# Patient Record
Sex: Female | Born: 1945 | Race: White | Hispanic: No | Marital: Married
Health system: Midwestern US, Community
[De-identification: ages and names within clinical notes are randomized; demographics above are authoritative.]

## PROBLEM LIST (undated history)

## (undated) DIAGNOSIS — G4733 Obstructive sleep apnea (adult) (pediatric): Secondary | ICD-10-CM

## (undated) DIAGNOSIS — K219 Gastro-esophageal reflux disease without esophagitis: Secondary | ICD-10-CM

## (undated) DIAGNOSIS — J45909 Unspecified asthma, uncomplicated: Secondary | ICD-10-CM

## (undated) DIAGNOSIS — L138 Other specified bullous disorders: Secondary | ICD-10-CM

## (undated) DIAGNOSIS — I10 Essential (primary) hypertension: Secondary | ICD-10-CM

## (undated) DIAGNOSIS — G709 Myoneural disorder, unspecified: Secondary | ICD-10-CM

## (undated) DIAGNOSIS — H9190 Unspecified hearing loss, unspecified ear: Secondary | ICD-10-CM

## (undated) DIAGNOSIS — G20 Parkinson's disease: Secondary | ICD-10-CM

## (undated) DIAGNOSIS — G4730 Sleep apnea, unspecified: Secondary | ICD-10-CM

## (undated) DIAGNOSIS — I341 Nonrheumatic mitral (valve) prolapse: Secondary | ICD-10-CM

## (undated) DIAGNOSIS — R42 Dizziness and giddiness: Secondary | ICD-10-CM

## (undated) DIAGNOSIS — E785 Hyperlipidemia, unspecified: Secondary | ICD-10-CM

## (undated) DIAGNOSIS — K625 Hemorrhage of anus and rectum: Secondary | ICD-10-CM

## (undated) DIAGNOSIS — T7840XA Allergy, unspecified, initial encounter: Secondary | ICD-10-CM

## (undated) DIAGNOSIS — G20A1 Parkinson's disease without dyskinesia, without mention of fluctuations: Secondary | ICD-10-CM

## (undated) DIAGNOSIS — R0600 Dyspnea, unspecified: Secondary | ICD-10-CM

## (undated) DIAGNOSIS — M1990 Unspecified osteoarthritis, unspecified site: Secondary | ICD-10-CM

## (undated) DIAGNOSIS — D649 Anemia, unspecified: Secondary | ICD-10-CM

## (undated) HISTORY — DX: Hyperlipidemia, unspecified: E78.5

## (undated) HISTORY — PX: TONSILLECTOMY: SUR1361

## (undated) HISTORY — DX: Anemia, unspecified: D64.9

## (undated) HISTORY — PX: REPLACEMENT TOTAL KNEE BILATERAL: SUR1225

## (undated) HISTORY — DX: Parkinson's disease: G20

## (undated) HISTORY — DX: Allergy, unspecified, initial encounter: T78.40XA

## (undated) HISTORY — PX: KNEE SURGERY: SHX244

## (undated) HISTORY — PX: ABDOMINAL HYSTERECTOMY: SHX81

## (undated) HISTORY — PX: APPENDECTOMY: SHX54

## (undated) HISTORY — DX: Parkinson's disease without dyskinesia, without mention of fluctuations: G20.A1

## (undated) HISTORY — DX: Gastro-esophageal reflux disease without esophagitis: K21.9

---

## 2010-09-22 ENCOUNTER — Inpatient Hospital Stay
Admit: 2010-09-22 | Discharge: 2010-09-22 | Disposition: A | Discharge status: Home / Self Care | Attending: Emergency Medicine

## 2010-09-22 LAB — URINALYSIS WITH MICROSCOPIC
Bilirubin Urine: NEGATIVE
Glucose, Ur: NEGATIVE mg/dL
Nitrite, Urine: NEGATIVE
Specific Gravity, UA: 1.01 (ref <=1.035)
Urobilinogen, Urine: 0.2 E.U./dL (ref 0.2–1.0)
pH, UA: 8 (ref 5.0–8.5)

## 2010-09-22 MED ORDER — CIPROFLOXACIN HCL 500 MG PO TABS
500 MG | ORAL_TABLET | Freq: Two times a day (BID) | ORAL | Status: AC
Start: 2010-09-22 — End: 2010-10-02

## 2010-09-22 MED ORDER — PHENAZOPYRIDINE HCL 100 MG PO TABS
100 MG | ORAL_TABLET | Freq: Three times a day (TID) | ORAL | Status: AC | PRN
Start: 2010-09-22 — End: 2010-09-25

## 2010-09-22 MED ORDER — FLUCONAZOLE 150 MG PO TABS
150 MG | ORAL_TABLET | Freq: Once | ORAL | Status: AC
Start: 2010-09-22 — End: 2010-09-22

## 2010-09-22 MED ADMIN — ciprofloxacin (CIPRO) 500 MG tablet: ORAL | @ 09:00:00 | NDC 00172531260

## 2010-09-22 MED ADMIN — phenazopyridine (PYRIDIUM) tablet 200 mg: ORAL | @ 09:00:00 | NDC 68084029211

## 2010-09-22 MED FILL — PHENAZOPYRIDINE HCL 100 MG PO TABS: 100 mg | ORAL | Qty: 2

## 2010-09-22 MED FILL — CIPROFLOXACIN HCL 500 MG PO TABS: 500 mg | ORAL | Qty: 1

## 2010-09-22 NOTE — ED Notes (Signed)
Discharged with voiced understanding of instructions - scripts given - left in no distress with family    Rudell Cobb, RN  09/22/10 [phone]

## 2010-09-22 NOTE — Discharge Instructions (Signed)
Urinary Tract Infection (UTI)  Infections of the urinary tract can start in several places. A bladder infection (cystitis), a kidney infection (pyelonephritis), and a prostate infection (prostatitis) are different types of urinary tract infections. They usually get better if treated with medicines (antibiotics) that kill germs. Take all the medicine until it is gone. You or your child may feel better in a few days, but TAKE ALL MEDICINE or the infection may not respond and may become more difficult to treat.  HOME CARE INSTRUCTIONS   Drink enough water and fluids to keep the urine clear or pale yellow. Cranberry juice is especially recommended, in addition to large amounts of water.    Avoid caffeine, tea, and carbonated beverages. They tend to irritate the bladder.    Alcohol may irritate the prostate.    Only take over-the-counter or prescription medicines for pain, discomfort, or fever as directed by your caregiver.   FINDING OUT THE RESULTS OF YOUR TEST  Not all test results are available during your visit. If your or your child's test results are not back during the visit, make an appointment with your caregiver to find out the results. Do not assume everything is normal if you have not heard from your caregiver or the medical facility. It is important for you to follow up on all test results.  TO PREVENT FURTHER INFECTIONS:   Empty the bladder often. Avoid holding urine for long periods of time.    After a bowel movement, women should cleanse from front to back. Use each tissue only once.    Empty the bladder before and after sexual intercourse.   SEEK MEDICAL CARE IF:   There is back pain.    You or your child has an oral temperature above 102 F (38.9 C).    Your baby is older than 3 months with a rectal temperature of 100.5 F (38.1 C) or higher for more than 1 day.    Your or your child's problems (symptoms) are no better in 3 days. Return sooner if you or your child is getting worse.   SEEK  IMMEDIATE MEDICAL CARE IF:   There is severe back pain or lower abdominal pain.    You or your child develops chills.    You or your child has an oral temperature above 102 F (38.9 C), not controlled by medicine.    Your baby is older than 3 months with a rectal temperature of 102 F (38.9 C) or higher.    Your baby is 64 months old or younger with a rectal temperature of 100.4 F (38 C) or higher.    There is nausea or vomiting.    There is continued burning or discomfort with urination.   MAKE SURE YOU:   Understand these instructions.    Will watch this condition.    Will get help right away if you or your child is not doing well or gets worse.   Document Released: 06/16/2009   [facility] Patient Information 2012 [place].

## 2010-09-22 NOTE — ED Provider Notes (Signed)
HPI Pt. With 3 day hx or hematuria, dysuria, frequency, urgency. No fever, no chills, no other sxs.    Review of Systems   Constitutional: Negative for fever and chills.   HENT: Negative for ear pain, sore throat and sinus pressure.    Eyes: Negative for pain, discharge and redness.   Respiratory: Negative for cough, shortness of breath and wheezing.    Cardiovascular: Negative for chest pain.   Gastrointestinal: Negative for nausea, vomiting, diarrhea and abdominal distention.   Genitourinary: Positive for dysuria, urgency, frequency and hematuria.   Musculoskeletal: Negative for back pain and arthralgias.   Skin: Negative for rash and wound.   Neurological: Negative for weakness and headaches.   Hematological: Negative for adenopathy.   All other systems reviewed and are negative.        Physical Exam   Nursing note and vitals reviewed.  Constitutional: She is oriented to person, place, and time. She appears well-developed and well-nourished. No distress.   HENT:   Head: Normocephalic and atraumatic.   Mouth/Throat: Oropharynx is clear and moist.   Eyes: Conjunctivae and EOM are normal. Pupils are equal, round, and reactive to light.   Neck: Normal range of motion. Neck supple. No JVD present. No tracheal deviation present. No thyromegaly present.   Cardiovascular: Normal rate, regular rhythm, normal heart sounds and intact distal pulses.    Pulmonary/Chest: Effort normal and breath sounds normal. No respiratory distress. She has no wheezes. She has no rales.   Abdominal: Bowel sounds are normal. She exhibits no distension. There is no tenderness. There is no guarding.   Musculoskeletal: Normal range of motion.   Lymphadenopathy:     She has no cervical adenopathy.   Neurological: She is alert and oriented to person, place, and time. She has normal reflexes. No cranial nerve deficit. Coordination normal.   Skin: Skin is warm and dry. She is not diaphoretic.     --------------------------------------------- PAST  HISTORY ---------------------------------------------  Past Medical History:  has a past medical history of Asthma and Necrosis of dental pulp.    Past Surgical History:  has past surgical history that includes Knee arthroscopy; Appendectomy; Tonsillectomy; and Hysterectomy.    Social History:  reports that she has never smoked. She does not have any smokeless tobacco history on file. She reports that she does not drink alcohol or use illicit drugs.    Family History: family history is not on file.     The patient???s home medications have been reviewed.    Allergies: Codeine    -------------------------------------------------- RESULTS -------------------------------------------------  Results for orders placed during the hospital encounter of 09/22/10   URINALYSIS WITH MICROSCOPIC       Component Value Range    Color, UA YELLOW  YELLOW     Clarity, UA CLEAR  CLEAR     Glucose, Ur NEGATIVE  NEGATIVE (mg/dL)    Bilirubin Urine NEGATIVE  NEGATIVE     Ketones, Urine TRACE (*) NEGATIVE (mg/dL)    Specific Gravity, UA 1.010  <=1.035     Blood, Urine LARGE (*) NEGATIVE     pH, UA 8.0  5.0 - 8.5     Protein, UA TRACE (*) NEGATIVE (mg/dL)    Urobilinogen, Urine 0.2  0.2 - 1.0 (E.U./dL)    Nitrite, Urine NEGATIVE  NEGATIVE     Leukocyte Esterase, Urine MODERATE (*) NEGATIVE           ------------------------- NURSING NOTES AND VITALS REVIEWED ---------------------------   The nursing notes within  the ED encounter and vital signs as below have been reviewed.   BP 110/71   Pulse 95   Temp(Src) 98.7 ??F (37.1 ??C) (Oral)   Resp 16   Ht 5' (1.524 m)   Wt 208 lb (94.348 kg)   BMI 40.62 kg/m2   SpO2 98%  Oxygen Saturation Interpretation: Normal      ------------------------------------------ PROGRESS NOTES ------------------------------------------   I have spoken with the patient and discussed today???s results, in addition to providing specific details for the plan of care and counseling regarding the diagnosis and prognosis.  Their  questions are answered at this time and they are agreeable with the plan.      --------------------------------- ADDITIONAL PROVIDER NOTES ---------------------------------         Medications   omeprazole (PRILOSEC) 20 MG capsule (not administered)   pravastatin (PRAVACHOL) 80 MG tablet (not administered)   benazepril (LOTENSIN) 10 MG tablet (not administered)   traMADol (ULTRAM) 50 MG tablet (not administered)   clobetasol (TEMOVATE) 0.05 % cream (not administered)   therapeutic multivitamin-minerals (THERAGRAN-M) tablet (not administered)   aspirin 81 MG chewable tablet (not administered)   docusate sodium (COLACE) 100 MG capsule (not administered)   Multiple Vitamins-Calcium (VIACTIV MULTI-VITAMIN) CHEW (not administered)   Cholecalciferol (VITAMIN D3) 1000 UNITS CAPS (not administered)   ciprofloxacin (CIPRO) tablet 500 mg (not administered)   phenazopyridine (PYRIDIUM) 100 MG tablet (not administered)   ciprofloxacin (CIPRO) 500 MG tablet (not administered)   fluconazole (DIFLUCAN) 150 MG tablet (not administered)   ciprofloxacin (CIPRO) 500 MG tablet (not administered)   phenazopyridine (PYRIDIUM) tablet 200 mg (200 mg Oral Given 09/22/10 0525)     Labs Reviewed   URINALYSIS WITH MICROSCOPIC - Abnormal; Notable for the following:     Ketones, Urine TRACE (*)     Blood, Urine LARGE (*)     Protein, UA TRACE (*)     Leukocyte Esterase, Urine MODERATE (*)     All other components within normal limits   URINE CULTURE     1. Acute cystitis           This patient is stable for discharge.  I have shared the specific conditions for return, as well as the importance of follow-up.        Procedures    MDM      .        Noe Gens, MD  09/22/10 [phone]

## 2011-10-26 LAB — URINALYSIS
Bilirubin Urine: NEGATIVE
Glucose, Ur: NEGATIVE mg/dL
Ketones, Urine: NEGATIVE mg/dL
Nitrite, Urine: NEGATIVE
Protein, UA: NEGATIVE mg/dL
Specific Gravity, UA: 1.015 (ref <=1.035)
Urobilinogen, Urine: 0.2 E.U./dL (ref 0.2–1.0)
pH, UA: 7.5 (ref 5.0–8.5)

## 2011-10-26 LAB — COMPREHENSIVE METABOLIC PANEL
ALT: 18 U/L (ref 10–49)
AST: 17 U/L (ref 0–33)
Albumin: 4.2 g/dL (ref 3.2–4.8)
Alkaline Phosphatase: 69 U/L (ref 45–129)
BUN: 8 mg/dL — ABNORMAL LOW (ref 9–23)
CO2: 28 mmol/L (ref 20–31)
Calcium: 9.7 mg/dL (ref 8.6–10.5)
Chloride: 106 mmol/L (ref 99–109)
Creatinine: 0.4 mg/dL — ABNORMAL LOW (ref 0.5–1.1)
Glucose: 100 mg/dL (ref 70–110)
Potassium: 3.8 mmol/L (ref 3.5–5.5)
Sodium: 140 mmol/L (ref 132–146)
Total Bilirubin: 0.6 mg/dL (ref 0.3–1.2)
Total Protein: 6.7 g/dL (ref 5.7–8.2)

## 2011-10-26 LAB — CBC
Hematocrit: 41.8 % (ref 34.0–48.0)
Hemoglobin: 13.9 g/dL (ref 11.5–15.5)
MCH: 31.9 pg (ref 26.0–35.0)
MCHC: 33.4 % (ref 32.0–34.5)
MCV: 95.5 fL (ref 80.0–99.9)
MPV: 8.2 fL (ref 7.0–12.0)
Platelets: 228 E9/L (ref 130–450)
RBC: 4.38 E12/L (ref 3.50–5.50)
RDW: 13.6 fL (ref 11.5–15.0)
WBC: 7.6 E9/L (ref 4.5–11.5)

## 2011-10-26 LAB — MICROSCOPIC URINALYSIS

## 2011-10-26 LAB — ANTIBODY SCREEN: Antibody Screen: NEGATIVE

## 2011-10-26 LAB — GFR CALCULATED: Gfr Calculated: >60 mL/min/{1.73_m2} (ref >=60)

## 2011-10-26 LAB — ABO/RH: ABO/Rh: A POS

## 2011-10-26 NOTE — Progress Notes (Addendum)
[facility] PRE-ADMISSION TESTING INSTRUCTIONS    The Preadmission Testing patient is instructed accordingly using the following criteria (check applicable):    GENERAL INSTRUCTIONS:  [x]  Antibacterial Soap shower or bath AM of Surgery   [x]  Nothing by mouth after midnight   [] Hibiclens shower or bath the night before and the morning of surgery. Do not use             Hibiclens on your face or head.   [] No alcohol or illegal drug use within 24 hours of surgery.  [x] Jewelry, valuables or body piercing's should not be brought to the hospital.     [x]  Please do not wear fingernail polish or make up on the day of surgery.  [x]  Please be sure to arrange transportation to and from the hospital.  [x]  Transfusion Bracelet: Please apply to your wrist /  bring with you to the hospital on the      day of surgery.   []  Bring urine specimen day of surgery  [x]  Bring inhalers day of surgery  []  Bring insulin day of surgery  [x]  Bring in a copy of Living will or Durable Power of attorney papers.  []  C-PAP/ Bi-Pap: If you have sleep apnea and are admitted to the hospital, please bring      your C-Pap machine with you to the hospital.  []  Other Instructions:        PARKING INSTRUCTIONS:  [x]  Parking the day of Surgery is located in the I Lot. The I Lot is located on the corner of [address] and [address]. Enter the hospital through the [facility] entrance. Check in at the [facility] desk area.    [x]  Arrival Time0800:        EDUCATION INSTRUCTIONS:  []  Knee or hip replacement booklet & exercise pamphlets given.    []  [facility] Pamphlet given    [x]  Pre-admission Testing educational folder given  []  Incentive Spirometry,coughing & deep breathing exercises reviewed.     []  Food and Drug interaction pamphlets given.   []  Anesthesia Pamphlets  [x] Procedure/physician specific instructions   [] Lap Cholecystectomy  [] Medication information sheet(s)  [] Breast biopsy  [] Hernia  repair   [] Fluoroscopy Educational pamphlet and Teaching    []   [] Other-  []    [] Other      MEDICATION INSTRUCTIONS:  [x]  Bring a complete medication list, make a note of last time medication taken.  [x]  Take the following medications the morning of Surgery with a sip of water:inhailers and omeprazole          []     What to expect:  [x]  If you are admitted to the hospital post operatively, the following toiletries are are supplied: Soap, toothbrush, tooth[paste, Deoderant and lotion. Slippers are also provided.     []  If you are admitted to the rehabilitation unit for physical therapy postoperatively, pack a suitcase with pajama's and clothing to wear while you are there. Have your family bring it to the rehab unit facility once you are transferred postoperatively.    The above instructions were reviewed with patient/significant other.    Comments:            Nurse Sheran Fava ,R.N.   Date/time 10/26/2011 10:03 AM         PAT:  [phone]

## 2011-11-01 NOTE — H&P (Signed)
Christina Robbins, Christina Robbins                                 [id number]      DATE:  11/02/2011      CHIEF COMPLAINT:  Left knee pain, osteoarthritis.      HISTORY OF PRESENT ILLNESS:  This is a 66 year old white female with chronic   left knee with known osteoarthritis.  She has had previous conservative   measures including injections, medications, physical therapy, and arthroscopy   in the past, none of which have provided lasting relief.  She is having   progressively disabling pain due to arthritis and is being admitted for left   total knee arthroplasty.      FAMILY PHYSICIAN:  Renata Caprice, D.O.      PAST MEDICAL HISTORY:  Medical problems include mitral valve prolapse,   hypertension, irregular heart rhythm, hypercholesterolemia, obesity, GERD,   asthma, sleep apnea, lower gum necrosis, and migraine headaches.      ALLERGIES:  Adverse medical reaction is to ENTEX, swollen thumb; CODEINE,   extreme hives; and VICODIN and VOLTAREN, had a rash on both arms.      MEDICATIONS:  Medications are benazepril HCL 10 mg daily, pravastatin 80 mg   daily, omeprazole 20 mg daily, aspirin 81 mg daily, multivitamin, calcium   supplement, vitamin D3, stool softener, Tessalon inhaler, Veramyst inhaler,   Proventil inhalers, Dulera and Xyzal per allergist, tramadol 50 mg as needed   for pain, Imitrex 50 mg p.r.n. migraine, clobetasol propionate 0.05% for   flareups, lorazepam 2 mg nightly, and for dental procedures amoxicillin 500   mg before dental work.      SOCIAL HISTORY:  She denies tobacco use.  Admits alcohol occasionally.                                  PHYSICAL EXAMINATION      GENERAL APPEARANCE:  Alert, adult, oriented white female.  Height 5 feet 1   inch, weight 215 pounds.      HEENT:  Normocephalic.  Extraocular motions intact.  Sclerae anicteric.   Nares and pharynx are clear.      NECK:  Supple.      LUNGS:  Clear.      CARDIOVASCULAR:  Rate and rhythm are regular.      ABDOMEN:  Soft, nontender.      EXTREMITIES:   Valgus on weightbearing.  Left knee range 0 to 100 degrees.   Distal neurologic and circulatory grossly intact.      LABORATORY AND X-RAY DATA:  X-ray shows stage 4 lateral anterior degenerative   changes, left knee.      CLINICAL IMPRESSION:   Osteoarthritis left knee.      PLAN:  Left total knee arthroplasty.  Procedure, recovery time, risks, and   limitations discussed in detail with patient who requests that we proceed.         Dictated by:  Concha Norway, M.D.         [name] / nmt/de   DD: 11/01/2011    1:59 P    DT: 11/01/2011  3:34 P   [id number]    [phone]   CC:  Christina Robbins, M.D.

## 2011-11-02 ENCOUNTER — Inpatient Hospital Stay
Admit: 2011-11-02 | Disposition: A | Discharge status: Skilled Nursing Facility | Source: Home / Self Care | Attending: Orthopaedic Surgery | Admitting: Orthopaedic Surgery

## 2011-11-02 LAB — POCT GLUCOSE: POC Glucose: 152 mg/dL — ABNORMAL HIGH (ref 70–110)

## 2011-11-02 MED ORDER — FLUTICASONE-SALMETEROL 115-21 MCG/ACT IN AERO
115-21 MCG/ACT | Freq: Two times a day (BID) | RESPIRATORY_TRACT | Status: DC
Start: 2011-11-02 — End: 2011-11-02

## 2011-11-02 MED ORDER — ONDANSETRON HCL 4 MG/2ML IJ SOLN
4 | Freq: Once | INTRAMUSCULAR | Status: DC | PRN
Start: 2011-11-02 — End: 2011-11-02

## 2011-11-02 MED ORDER — PROMETHAZINE HCL 25 MG/ML IJ SOLN
25 | Freq: Once | INTRAMUSCULAR | Status: DC | PRN
Start: 2011-11-02 — End: 2011-11-02

## 2011-11-02 MED ORDER — LORAZEPAM 1 MG PO TABS
1 | Freq: Once | ORAL | Status: AC | PRN
Start: 2011-11-02 — End: 2011-11-02

## 2011-11-02 MED ORDER — SUMATRIPTAN SUCCINATE 50 MG PO TABS
50 | Freq: Once | ORAL | Status: AC | PRN
Start: 2011-11-02 — End: 2011-11-02

## 2011-11-02 MED ADMIN — promethazine (PHENERGAN) injection 25 mg: INTRAMUSCULAR | @ 17:00:00 | NDC 00641092821

## 2011-11-02 MED ADMIN — ceFAZolin (ANCEF) 2 g in dextrose 5 % 100 mL IVPB: INTRAVENOUS | @ 20:00:00 | NDC 00781345170

## 2011-11-02 MED ADMIN — lactated ringers infusion: INTRAVENOUS | @ 13:00:00 | NDC 00338011704

## 2011-11-02 MED ADMIN — midazolam (VERSED) 2 MG/2ML injection: INTRAVENOUS | @ 13:00:00 | NDC 63323041112

## 2011-11-02 MED ADMIN — HYDROmorphone HCl PF (DILAUDID) 2 MG/ML injection: INTRAVENOUS | @ 17:00:00 | NDC 00409131230

## 2011-11-02 MED ADMIN — ceFAZolin (ANCEF) 2 g in dextrose 5 % 100 mL IVPB: INTRAVENOUS | @ 14:00:00 | NDC 00781345170

## 2011-11-02 MED ADMIN — meperidine (DEMEROL) 50 MG/ML injection: @ 17:00:00 | NDC 00409117830

## 2011-11-02 MED ADMIN — morphine sulfate (PF) injection 5 mg: INTRAMUSCULAR | @ 20:00:00 | NDC 00409189201

## 2011-11-02 MED ADMIN — HYDROcodone-acetaminophen (NORCO) 5-325 MG per tablet 2 tablet: 2 | ORAL | @ 22:00:00 | NDC 50268040315

## 2011-11-02 MED ADMIN — fentaNYL (SUBLIMAZE) injection 50 mcg: 50 ug | INTRAVENOUS | @ 16:00:00 | NDC 17478003002

## 2011-11-02 MED ADMIN — fentaNYL (SUBLIMAZE) injection 50 mcg: 50 ug | INTRAVENOUS | @ 17:00:00 | NDC 17478003002

## 2011-11-02 MED ADMIN — HYDROmorphone HCl PF (DILAUDID) injection 0.5 mg: 0.5 mg | INTRAVENOUS | @ 18:00:00 | NDC 00409131230

## 2011-11-02 MED FILL — ALBUTEROL SULFATE (2.5 MG/3ML) 0.083% IN NEBU: RESPIRATORY_TRACT | Qty: 3

## 2011-11-02 MED FILL — GENATON PO CHEW: ORAL | Qty: 1

## 2011-11-02 MED FILL — CEFAZOLIN SODIUM 1 G IJ SOLR: 1 g | INTRAMUSCULAR | Qty: 2000

## 2011-11-02 MED FILL — MORPHINE SULFATE (PF) 8 MG/ML IV SOLN: 8 mg/mL | INTRAVENOUS | Qty: 1

## 2011-11-02 MED FILL — BACITRACIN 50000 UNITS IM SOLR: 50000 units | INTRAMUSCULAR | Qty: 3

## 2011-11-02 MED FILL — ISOFLURANE IN SOLN: RESPIRATORY_TRACT | Qty: 25

## 2011-11-02 MED FILL — PROMETHAZINE HCL 25 MG/ML IJ SOLN: 25 MG/ML | INTRAMUSCULAR | Qty: 1

## 2011-11-02 MED FILL — SUMATRIPTAN SUCCINATE 50 MG PO TABS: 50 MG | ORAL | Qty: 1

## 2011-11-02 MED FILL — DEXAMETHASONE SODIUM PHOSPHATE 10 MG/ML IJ SOLN: 10 MG/ML | INTRAMUSCULAR | Qty: 1

## 2011-11-02 MED FILL — ONDANSETRON HCL 4 MG/2ML IJ SOLN: 4 MG/2ML | INTRAMUSCULAR | Qty: 2

## 2011-11-02 MED FILL — FLUTICASONE PROPIONATE 50 MCG/ACT NA SUSP: 50 MCG/ACT | NASAL | Qty: 16

## 2011-11-02 MED FILL — MIDAZOLAM HCL 2 MG/2ML IJ SOLN: 2 MG/ML | INTRAMUSCULAR | Qty: 2

## 2011-11-02 MED FILL — NEOSTIGMINE METHYLSULFATE 0.5 MG/ML IJ SOLN: 0.5 MG/ML | INTRAMUSCULAR | Qty: 10

## 2011-11-02 MED FILL — LABETALOL HCL 5 MG/ML IV SOLN: 5 MG/ML | INTRAVENOUS | Qty: 20

## 2011-11-02 MED FILL — LIDOCAINE HCL (CARDIAC) 20 MG/ML IV SOLN: 20 MG/ML | INTRAVENOUS | Qty: 5

## 2011-11-02 MED FILL — HYDROMORPHONE HCL PF 2 MG/ML IJ SOLN: 2 MG/ML | INTRAMUSCULAR | Qty: 1

## 2011-11-02 MED FILL — GLYCOPYRROLATE 1 MG/5ML IJ SOLN: 1 MG/5ML | INTRAMUSCULAR | Qty: 5

## 2011-11-02 MED FILL — HYDROCODONE-ACETAMINOPHEN 5-325 MG PO TABS: 5-325 MG | ORAL | Qty: 2

## 2011-11-02 MED FILL — FENTANYL CITRATE 0.05 MG/ML IJ SOLN: 0.05 MG/ML | INTRAMUSCULAR | Qty: 2

## 2011-11-02 MED FILL — DEMEROL 50 MG/ML IJ SOLN: 50 MG/ML | INTRAMUSCULAR | Qty: 1

## 2011-11-02 MED FILL — LISINOPRIL 10 MG PO TABS: 10 MG | ORAL | Qty: 1

## 2011-11-02 MED FILL — ADVAIR DISKUS 250-50 MCG/DOSE IN AEPB: 250-50 MCG/DOSE | RESPIRATORY_TRACT | Qty: 14

## 2011-11-02 MED FILL — HYDRALAZINE HCL 20 MG/ML IJ SOLN: 20 MG/ML | INTRAMUSCULAR | Qty: 1

## 2011-11-02 NOTE — Plan of Care (Signed)
[facility] PACU Education and Care Plan Goals    Post Operative Pain Management     Intervention: Evaluates Response Pain Management    Definition: Assesses patient responses to pain management interventions including physiological parameters and subjective and objective findings.    Activities:  [x]  Implements [facility] Pain Protocol  [x]  Observes for verbal and non-verbal cues indicating or discomfort.  [x]  Explains all patients have a right to pain relief  [x]  Assesses for changes in pain level after pain management intervention.    Outcomes:  [x]  Patient will verbalize understanding of pain scale and pain management.  [x]  Patient reports pain controlled based on a recognized pain scale (e.g.numerical 1-10 or  facial expressions)  [x]  Patient achieves predetermined pain goal  [x]  Self reports a comfort level acceptable for discharge  []  Other         Fall Risk Potential  [x]  Due to Perioperative medication administration  Additional Risk Identified:   []  Sensory deficit         []  Motor deficit         []  Balance problem         []  Home medication         []  Uses assistive device to ambulate    Goal(s) for fall prevention:  []  Prevent fall or injury by calling for assistance with activity and use of siderails while hospitalized  []  Prevent fall or injury by using assistance with activity after discharge.  []  Patient / Significant other verbalize understanding in use of any ordered assistive devices          Mobility Safety/ ADL  [x]  Reach a functional mobility goal within limitations of the procedure.          Post operative Assessment and Care  [x]  Standards of care met as delineated by [facility]AN.        Discharge Education and Goals  [x]  Patient voices understanding of PACU discharge criteria  []  Outpatient / significant other voices understanding of home care and follow up procedures.  []  Patient / significant other understanding of Special Needs:          []  Cooling device  [] Wound Support Device          []   Crutches   [] Drain             []  Walker   [] Other  [x]  Inpatient / significant other understands the plan for transfer to the inpatient unit            11/02/2011 12:28 PM Jeanie Sewer

## 2011-11-02 NOTE — Progress Notes (Signed)
Occupational Therapy   Occupational Therapy Initial Assessment  Date: 11/02/2011   Patient Name: Christina Robbins  MRN: [id number]     DOB: March 16, 1946    [id number]  Treatment Diagnosis: L Knee OA, s/p L TKA on 11-02-11    Recommendations: Inpatient Rehab    Restrictions  Restrictions/Precautions  Restrictions/Precautions: Weight Bearing  Lower Extremity Weight Bearing Restrictions  Left Lower Extremity Weight Bearing: Weight Bearing As Tolerated  Position Activity Restriction  Other position/activity restrictions: L Knee prec.  Vision/Hearing  Vision  Vision: Within Functional Limits  Hearing  Hearing: Within functional limits  Subjective   General  Chart Reviewed: Yes  Family / Caregiver Present: Yes (husband present and instructed)  Referring Practitioner: Dr. Marijean Bravo  Diagnosis: L Knee OA, s/p L TKA on 11-02-11  Pain Screening  Patient Currently in Pain: Yes  Pain Assessment: 0-10  Pain Level: 8  Pain Type: Surgical pain  Pain Location: Knee  Pain Orientation: Left    Home Living  Home Living  Lives With: Family (Husband (retired) & son (work & school))  Type of Home: House  Home Layout: Two level;Bed/Bath upstairs  Home Access: Stairs to enter without rails (3 steps in with L rail)     Orientation  Orientation  Overall Orientation Status: Within Functional Limits  Objective      Prior Function  ADL Assistance: Independent  Ambulation Assistance: Independent  Transfer Assistance: Independent  Observation/Palpation  Posture: Good  Balance  Sitting Balance: Independent  Standing Balance: Minimal assistance  Standing Balance  Activity: func. mobility with w.walker approx. 6' with MIN A of 2  ADL  Feeding: Independent  Grooming: Setup  UE Bathing: Setup;Stand by assistance  LE Bathing: Moderate assistance  UE Dressing: Setup;Stand by assistance  LE Dressing: Moderate assistance;Maximum assistance  Transfer: Moderate assistance;Minimal assistance  Coordination  Movements Are Fluid And Coordinated: Yes  Functional Activity  Tolerance  Functional Activity Tolerance: Tolerates < 10 Min exercise, no significant change in vital signs  Bed mobility  Supine to Sit: Minimal assistance (of 2)  Sit to Stand: Minimal assistance (of 2)  Bed to Chair: Minimal assistance (of 2)     Vision - Basic Assessment  Prior Vision: Wears glasses all the time  Cognition  Overall Cognitive Status: WFL  Perception  Overall Perceptual Status: WFL  Sensation  Overall Sensation Status: WFL     ROM  ROM: WFL  LUE Strength  Gross LUE Strength: WFL  L Hand Grasp: 4/5  L Hand Release: 4/5  RUE Strength  Gross RUE Strength: WFL  R Hand Grasp: 4/5  R Hand Release: 4/5     Hand Dominance  Hand Dominance: Right            Assessment   Assessment  Assessment: Decreased functional mobility ;Decreased ADL status;Decreased functional activity tolerance;Decreased high-level IADLs  Treatment Diagnosis: L Knee OA, s/p L TKA on 11-02-11  Rehab Potential: Good    Goal Formulation: Patient  Requires OT Follow Up: Yes  Total Treatment Time: 20   Response to Treatment  Response to Treatment: Patient Tolerated treatment well  Safety Devices  Safety Devices in place: Yes  Type of devices: Call light within reach;Nurse notified;Left in chair       Plan   Plan  Times per week: prn  Times per day: Daily  Patient Education: pt. instructed RE: safe transfers, ADLS, role of OT, treatment plan, recs., precs.    G-Code       Goals  Short term goals  Time Frame for Short term goals: 4-6 days  Short term goal 1: Increase ADLS to MOD I  Short term goal 2: Increase func. transfers & mobility to MOD I  Short term goal 3: Increase func. endurance to [facility] for lt./mod. axShirl Harris  License and Documentation Cosign  Therapy License Number: OT [id number]

## 2011-11-02 NOTE — Op Note (Signed)
Christina Robbins, Christina Robbins                                 [id number]      DATE OF PROCEDURE:  11/02/2011      SURGEON:  RAYMOND J. Loman Chroman, M.D.      ASSISTANTDanella Maiers, D.O., and C. Jadine Brumley, D.O.      PREOPERATIVE DIAGNOSIS:  Left knee osteoarthritis.      POSTOPERATIVE DIAGNOSIS:  Left knee osteoarthritis.      OPERATION:  (1) Left total knee arthroplasty.  (2) Application of   platelet-rich plasma gel, left knee.      ANESTHESIA:  General.      ESTIMATED BLOOD LOSS:  Minimal.      COMPLICATIONS:  None.      SPECIMENS:  Specimens were obtained, femoral and tibial bone.      BRIEF CLINICAL INDICATION:  Ms. Drennen is a 66 year old white female with   chronic left knee osteoarthritis.  She had previous conservative measures   including injections, medications, physical therapy, and arthroscopy in the   past which have not provided any ongoing relief of her knee pain.  She is   having disabling pain due to her arthritis and it is progressively worsening.   The risks and benefits of operative intervention were discussed with the   patient and she chose to proceed with total knee arthroplasty at this time.      DESCRIPTION OF PROCEDURE:  The patient was met in the preoperative holding   area.  The correct extremity was identified by the patient and the surgeon   and was signed.  The patient was brought into the operating room and   transferred to the operating table in a supine position.  A time-out was then   performed, including the patient's name, date of birth, procedure to be   performed and allergies.  After adequate general anesthesia was given via the   anesthesia team, a tourniquet was applied to the left thigh.  The leg was   then prepped and draped in the usual sterile fashion.  The tourniquet was   then inflated to 350 mmHg.      A midline skin incision was then carried out, centered over the patella, down   to the medial border of the tibial tubercle.  The subcutaneous tissue was   sharply dissected down to the  anterior capsule.  A medial parapatellar   arthrotomy was then performed.  The anteromedial capsule was then reflected   off the proximal medial tibia.  The patella was reflected laterally and the   knee was brought in to flexion.  Excess osteophytes were removed with a   rongeur.  The ACL and anterior horn of the medial and lateral meniscus were   then sharply excised.  The femur and tibia were then drilled in an   intramedullary fashion.  The intramedullary guide was then inserted into the   femur.  The distal femoral cutting block was brought into contact with the   distal femoral condyles.  It was placed in 6 degrees of valgus.  The cutting   block was then pinned.  The intramedullary guide was removed and the distal   femoral cut was made.  The distal femoral sizing guide was then applied in 3   degrees of external rotation.  The appropriate sized femoral component was   determined to be a size  4.  The size 4 four-in-one cutting guide was then   applied to the distal femur.  An angel wing was then used to confirm the   appropriate sized femoral component again as a size 4.  The anterior and   posterior distal femur cuts were then made followed by the chamfer cuts.  The   four-in-one cutting guide was removed.  The tibia was subluxed anteriorly and   the posterior horns of the medial and lateral menisci were sharply excised.   The intramedullary tibial guide was then inserted into the tibia with the   proximal tibial cutting guide in place.  The stylus was then placed on the most worn portion of the lateral tibial plateau and the cutting guide was pinned in place to take 2 mm of bone off the medial proximal tibia.  An extramedullary guide rod was   dropped through the tibial cutting guide.  It was found to be over the medial   third of the tibial tubercle down the tibial crest and in the center of the   ankle.  The intramedullary guide was removed.  The proximal tibia was cut.  The tibial   cutting guide was  then removed.  A #4 tibial trial was then applied to the   proximal tibia and found to be an appropriate size and was pinned in place with two half-pins.  A 9-mm trial polyethylene liner was inserted, followed by the #4 cruciate-retaining   femoral trial prosthesis.  These were impacted into place and the knee was   taken through a range of motion, including flexion and extension with varus   and valgus stresses applied.  It was found to be stable throughout this range   of motion.  The femoral trial component was then removed along with the trial   polyethylene liner.  Tibial keel punch was then inserted through the tibial   trial and the tibia was punched.  The tibial trial was then removed and the   knee was copiously irrigated with bacitracin-impregnated saline.  The knee   was again taken back into a flexed position.  Bone cement was applied to the   femoral and tibial prostheses.  A #4 final tibial baseplate was then impacted   into place.  Any excess cement was removed with a Therapist, nutritional.  A 9-mm   cruciate-retaining polyethylene liner was then impacted into place in the   tibial component followed by a #4 cruciate-retaining femoral component that   was impacted into place on the distal femur.  Again, any excess cement was removed with a freer-elevator.  The knee was again taken through a range of motion, including flexion/extension and varus/valgus stress, and was again noted to be stable throughout this range of motion.      The knee was again copiously irrigated with bacitracin-impregnated saline.   Platelet-rich plasma gel was then injected into the tissues overlying the   components.  The capsule was then closed with #0 Ethibond in an interrupted   fashion.  The subcutaneous tissues were then again irrigated with   bacitracin-impregnated saline.  Platelet-poor plasma was then injected into   the subcutaneous tissues.  The subcutaneous tissues were then closed with #0   Vicryl.  The skin was then  closed with #2-0 Vicryl and staples.  Adaptic and   a dry sterile dressing were then applied to the wound.      The tourniquet was then deflated, with a total tourniquet time  noted of 74   minutes.  The patient was then transferred back to her hospital bed and to   the PACU in stable condition.      Dr. Loman Chroman was present throughout the entire case.  The patient tolerated   the procedure well. There were no complications.            Dictated by:  C. Shantil Vallejo, D.O.   for Concha Norway, M.D.            CS / nmt   DD: 11/02/2011   12:13 P    DT: 11/02/2011  1:36 P   [id number]    [id number]   CC:  RAYMOND J. Loman Chroman, M.D.

## 2011-11-02 NOTE — Progress Notes (Signed)
Physical Therapy    Initial Assessment    Date: 11/02/2011  Patient Name: Christina Robbins  MRN: [id number]    DOB: February 03, 1946      Room #  5214  Diagnosis:  L knee OA, L TKA 11/02/11  Precautions:  WBAT L LE  Discharge Recommendation:  Rehab, unless exceeds goals  Equipment Needs:  w/w    Pt is alert and Oriented x 3  Pain level: 8/10, L knee  Sensation:  Pt denies numbness and tingling to extremities.    Pt lives with husband in a 2 story home with 3 stairs to enter and 1 rail.  Bed is on 2 floor and bath is on 2 floor.  Pt ambulated independently with no device PTA.    Current Functional Level  Activity NT Dep Max Mod A Min A CGA SBA Super Mod I Indep Comments   Supine-Sit     x         Sit-Supine     x         Roll     x         Scoot     x         Sit-Stand    x x         Stand-Sit    x x         Stand Pivot    x x         Ambulation      x      6' with w/w   Stairs x             Activity NT Dep Max Mod A Min A CGA SBA Super Mod I Indep Comments       ROM  BUE:  WFL  BLE:  WFL, L knee: 20 to 50 deg    Strength:  BUE:  WFL,   BLE:  WFL, L knee: 2/5, L ankle: 3+/5    Balance:  Sitting EOB:  3/4  Dynamic Standing:  2+/4    Comments:  Pt limited by L knee pain, rom and weakness  Pt is determined    Goals  Activity NT Dep Max Mod A Min A CGA SBA Super Mod I Indep Comments   Supine-Sit      x        Sit-Supine      x        Roll       x       Scoot      x x       Sit-Stand      x        Stand-Sit      x        Stand Pivot      x        Ambulation        x    50' with w/w   Stairs     x x     4 with 1 railing   Activity NT Dep Max Mod A Min A CGA SBA Super Mod I Indep Comments     Strength Goals:   Increase L knee strength by 2/3rd mm. Grade L knee: 0 to 90 deg  Balance Goals:   Increase sitting balance to 4-/4   Increase standing balance to 3+/4    Pt educated on L LE knee precautions and bedside exercises  Pt's response to education:  Pt verbalized understanding  Pt demonstrated understanding Pt unable to verbalize/  demonstrate  understanding  No further education required in this area Pt would benefit from further education   x x   x       Pt appropriate for gait team at this time:    YES NO    x         Pt's/ family goals   1. Walking normally, decrease pain        Patient and or family understand(s) diagnosis, prognosis, and plan of care.  yes    PLAN  PT care will be provided in accordance with the objectives noted above.  Whenever appropriate, clear delegation orders will be provided for nursing staff.  Exercises and functional mobility practice will be used as well as appropriate assistive devices or modalities to obtain goals. Patient and family education will also be administered as needed.    Frequency of treatments: daily BID x 3 days  Time in  1430  Time out  1500  Lovett Calender, PT  License number:  [id number]  Lovett Calender

## 2011-11-02 NOTE — Anesthesia Post-Procedure Evaluation (Signed)
Department of Anesthesiology  Post-Anesthesia Note    Name:  Christina Robbins                                         Age:  66 y.o.  MRN:  [id number]     Last Vitals:  BP 92/53   Pulse 99   Temp(Src) 98.2 ??F (36.8 ??C) (Oral)   Resp 14   Ht 5' (1.524 m)   Wt 210 lb (95.255 kg)   BMI 41.01 kg/m2   SpO2 98%  Patient Vitals for the past 4 hrs:   BP Temp Temp src Pulse Resp   11/03/11 0812 92/53 mmHg 98.2 ??F (36.8 ??C) Oral 99  14        Level of Consciousness:  Awake    Respiratory:  Stable    Oxygen Saturation:  Stable    Cardiovascular:  Stable    Hydration:  Adequate    PONV:  Stable    Post-op Pain:  Adequate analgesia    Post-op Assessment:  No apparent anesthetic complications    Additional Follow-Up / Treatment / Comment:  None    Arther Dames, MD  November 03, 2011   9:03 AM

## 2011-11-02 NOTE — Progress Notes (Signed)
xrays done per order. Christina Robbins

## 2011-11-02 NOTE — Plan of Care (Signed)
[facility] Perioperative Care Plan Goals    Outcome: The Patient demonstrates knowledge of pain management.    Outcome Definition: Patient/ Family members communicate/ demonstrate knowledge of pain management as evidenced by an understanding f the plan for pain assessment and management, including knowledge of pharmacologic and nonpharmacologic methods of pain management and the need to report pain in a timely manner.    Activities:    [x]  Reviews records for type of pain being treated, medical condition, demographics and cultural cues.  [x] Appropriate assessment and integration of data as delineated by ASPAN Standards of Perianesthesia Nursing Practice.  [x]  Provides pain management instruction for perioperative experience, including protocols for pain medication postoperatively.  [x]  Explains patients have the right to pain management.  [x]  Evaluates response to pain management instruction.    Outcomes:  [x] Patient will verbalize understanding of pain scale and pain management.  [x] Pre-operative determination of patient's anticipated Post-Operative pain goal: 2of 10 on 10 point scale post op goal  [x] Patient will verbalize plan for pain management  [] Other :          Compliance with Pre-op Instructions - Patient reports compliance with:  [x] Taking prescribed home meds before arrival  [x] Surgical prep instructions specific to the patient's surgery    Education   []  Fluorsoscopy Education/ Pamphlet     Fall Risk Potential - Preoperatively  [x] No preoperative risk identified  [] Preop risk identified:     []  Sensory deficit     []  Motor deficit     []  Balance problem     []  Home medication     [] Uses assistive device to ambulate  [x] Due to Perioperative medication administration    Goal(s) for fall prevention:  [x]  Prevent fall or injury by use of encouraging call light for assistance, side rails, and assisting with activity.  [x] Patient / Significant other verbalize understanding of need to call for assistance  prior to getting out of bed.      Patient Safety  [x]  Patient identification  [x]  Site verification (See Universal Protocol Checklist)  [x]  General Safety precautions  [x]  Side rails up, bed/stretcher low position and wheels locked  [x]  Call light within reach  [x]  Patient instructed to call for assistance prior to getting out of bed    Instructions - Discharge planning for Outpatients  [x]  Patient / significant other voices understanding of home care and follow up procedures.  Anticipated Special Needs upon discharge:  []  Cooling device  []  Crutches  []  Walker  []  Wound Support device   [] Drain  [] Other       Instructions -  Discharge planning for Admitted Patients  [x]  Patient / Significant other understands plan for admission after surgery  []  Patient / Significant other understands plan for anticipated discharge disposition                   11/02/2011 8:18 AM Mahala Menghini

## 2011-11-02 NOTE — Brief Op Note (Signed)
Brief Postoperative Note    Christina Robbins  Date of Birth:  1945/07/06  [id number]    Pre-operative Diagnosis: Left knee osteoarthritis     Post-operative Diagnosis: Same    Procedure: 1)  Left total knee arthroplasty          2)  Application PRP left knee    Anesthesia: General    Surgeons/Assistants: Dr. Marijean Bravo / S. Mountain, C. Nishika Parkhurst    Estimated Blood Loss: Minimal    Complications: none    Specimens: were obtained      Christina Robbins

## 2011-11-02 NOTE — Anesthesia Pre-Procedure Evaluation (Signed)
[facility]        Department of Anesthesiology  Pre-Anesthesia Evaluation/Consultation       Name:  Christina Robbins                                         Age:  66 y.o.  MRN:  [id number]           Procedure (Scheduled):  Left Total Joint Arthroplasty Knee  Surgeon:  Dr. Loman Chroman     Allergies   Allergen Reactions   ??? Codeine      Patient Active Problem List   Diagnosis   ??? Preoperative testing     Past Medical History   Diagnosis Date   ??? Asthma    ??? Necrosis of dental pulp    ??? Sleep apnea      cpap   ??? GERD (gastroesophageal reflux disease)      Past Surgical History   Procedure Laterality Date   ??? Knee arthroscopy     ??? Appendectomy     ??? Tonsillectomy     ??? Hysterectomy       History   Substance Use Topics   ??? Smoking status: Never Smoker    ??? Smokeless tobacco: Not on file   ??? Alcohol Use: No     Medications  No current facility-administered medications on file prior to encounter.     Current Outpatient Prescriptions on File Prior to Encounter   Medication Sig Dispense Refill   ??? LORazepam (ATIVAN) 2 MG tablet Take 2 mg by mouth once as needed. Before dental work       ??? benzonatate (TESSALON) 100 MG capsule Take 200 mg by mouth 3 times daily as needed.       ??? levocetirizine (XYZAL) 5 MG tablet Take 5 mg by mouth nightly as needed.       ??? SUMAtriptan (IMITREX) 50 MG tablet Take 50 mg by mouth once as needed.       ??? fluticasone (VERAMYST) 27.5 MCG/SPRAY nasal spray 2 sprays by Nasal route daily as needed.       ??? albuterol (PROVENTIL) (2.5 MG/3ML) 0.083% nebulizer solution Take 2.5 mg by nebulization every 6 hours as needed.       ??? budesonide-formoterol (SYMBICORT) 160-4.5 MCG/ACT AERO Inhale 2 puffs into the lungs 2 times daily.       ??? aluminum hydroxide-magnesium-alginic acid (GENATON 80-20) chewable tablet Take 1 tablet by mouth 3 times daily as needed.       ??? magnesium 30 MG tablet Take 250 mg by mouth every other day.       ??? Casanthranol-Docusate Sodium 30-100 MG CAPS Take 3 capsules by mouth every other  day.       ??? omeprazole (PRILOSEC) 20 MG capsule Take 20 mg by mouth daily.         ??? pravastatin (PRAVACHOL) 80 MG tablet Take 80 mg by mouth daily.         ??? benazepril (LOTENSIN) 10 MG tablet Take 10 mg by mouth daily.         ??? traMADol (ULTRAM) 50 MG tablet Take 50 mg by mouth every 6 hours as needed.         ??? clobetasol (TEMOVATE) 0.05 % cream Apply  topically 2 times daily. Apply topically 2 times daily.        ??? therapeutic multivitamin-minerals (THERAGRAN-M)  tablet Take 1 tablet by mouth daily.         ??? aspirin 81 MG chewable tablet Take 81 mg by mouth daily. Last dose 10/23/11       ??? docusate sodium (COLACE) 100 MG capsule Take 100 mg by mouth 2 times daily.         ??? Multiple Vitamins-Calcium (VIACTIV MULTI-VITAMIN) CHEW Take  by mouth.         ??? Cholecalciferol (VITAMIN D3) 1000 UNITS CAPS Take  by mouth.           Current Facility-Administered Medications   Medication Dose Route Frequency Provider Last Rate Last Dose   ??? lactated ringers infusion   Intravenous Continuous Concha Norway, MD       ??? sodium chloride 0.9 % injection 10 mL  10 mL Intravenous Q12H [facility] Concha Norway, MD       ??? sodium chloride 0.9 % injection 10 mL  10 mL Intravenous PRN Concha Norway, MD       ??? ceFAZolin (ANCEF) 2 g in dextrose 5 % 100 mL IVPB  2 g Intravenous On Call to OR Concha Norway, MD         Vital Signs (Current) There were no vitals filed for this visit.  Vital Signs Statistics (for past 48 hrs)     No Data Recorded    BP Readings from Last 3 Encounters:   09/22/10 110/71     BMI  There is no weight on file to calculate BMI.  Estimated body mass index is 41.01 kg/(m^2) as calculated from the following:    Height as of 10/26/11: 5' (1.524 m).    Weight as of 10/26/11: 210 lb (95.255 kg).    CBC   Lab Results   Component Value Date    WBC 7.6 10/26/2011    RBC 4.38 10/26/2011    HGB 13.9 10/26/2011    HCT 41.8 10/26/2011    MCV 95.5 10/26/2011    RDW 13.6 10/26/2011    PLT 228 10/26/2011     CMP    Lab  Results   Component Value Date    NA 140 10/26/2011    K 3.8 10/26/2011    CL 106 10/26/2011    CO2 28 10/26/2011    BUN 8 10/26/2011    CREATININE 0.4 10/26/2011    LABGLOM >60 10/26/2011    GLUCOSE 100 10/26/2011    PROT 6.7 10/26/2011    CALCIUM 9.7 10/26/2011    BILITOT 0.6 10/26/2011    ALKPHOS 69 10/26/2011    AST 17 10/26/2011    ALT 18 10/26/2011     BMP    Lab Results   Component Value Date    NA 140 10/26/2011    K 3.8 10/26/2011    CL 106 10/26/2011    CO2 28 10/26/2011    BUN 8 10/26/2011    CREATININE 0.4 10/26/2011    CALCIUM 9.7 10/26/2011    LABGLOM >60 10/26/2011    GLUCOSE 100 10/26/2011     POCGlucose  No results found for this basename: GLUCOSE,  in the last 72 hours   Coags    No results found for this basename: PROTIME, INR, APTT     HCG (If Applicable)   No results found for this basename: PREGTESTUR, PREGSERUM, HCG, HCGQUANT      ABGs   No results found for this basename: PHART, PO2ART, PCO2ART, HCO3ART, BEART, O2SATART  Type & Screen (If Applicable)  No results found for this basename: LABABO, [facility]     Radiology (If Applicable)  Cardiac Testing (If Applicable)   EKG (If Applicable)    Anesthesia Evaluation     Patient summary reviewed    No history of anesthetic complications   Airway   Mallampati: II  TM distance: >3 FB  Neck ROM: full  Dental        Pulmonary     breath sounds clear to auscultation  (+) asthma, sleep apnea,   (-) COPD, shortness of breath  Cardiovascular   Exercise tolerance: (Recent Echocardiogram with good left ventricular function)  (+) hypertension well controlled,   (-) past MI, CAD, CHF    Rhythm: regular  Rate: normal    Neuro/Psych - negative ROS   (-) seizures, TIA  GI/Hepatic/Renal    (+) GERD well controlled,   (-) liver disease, renal disease    Endo/Other    Abdominal   (+) obese,        Other findings: History of gum necrosis treated.           Allergies: Codeine    NPO Status:                              Anesthesia Plan    ASA 3     general     intravenous induction      Anesthetic plan and risks discussed with patient.  Use of blood products discussed with patient whom consented to blood products.   Plan discussed with CRNA.          Develle Sievers  11/02/2011

## 2011-11-03 LAB — POCT GLUCOSE
POC Glucose: 136 mg/dL — ABNORMAL HIGH (ref 70–110)
POC Glucose: 122 mg/dL — ABNORMAL HIGH (ref 70–110)
POC Glucose: 106 mg/dL (ref 70–110)
POC Glucose: 107 mg/dL (ref 70–110)

## 2011-11-03 LAB — EKG 12-LEAD: ECG Report: BORDERLINE

## 2011-11-03 LAB — CBC WITH AUTO DIFFERENTIAL
Absolute Eos #: 0 E9/L — ABNORMAL LOW (ref 0.05–0.50)
Absolute Lymph #: 0.98 E9/L — ABNORMAL LOW (ref 1.50–4.00)
Absolute Mono #: 0.89 E9/L (ref 0.10–0.95)
Absolute Neut #: 9.4 E9/L — ABNORMAL HIGH (ref 1.80–7.30)
Basophils Absolute: 0.01 E9/L (ref 0.00–0.20)
Basophils: 0 % (ref 0–2)
Eosinophils %: 0 % (ref 0–6)
Hematocrit: 36.5 % (ref 34.0–48.0)
Hemoglobin: 12 g/dL (ref 11.5–15.5)
Lymphocytes: 9 % — ABNORMAL LOW (ref 20–42)
MCH: 31.9 pg (ref 26.0–35.0)
MCHC: 33 % (ref 32.0–34.5)
MCV: 96.8 fL (ref 80.0–99.9)
MPV: 8.2 fL (ref 7.0–12.0)
Monocytes: 8 % (ref 2–12)
Platelets: 220 E9/L (ref 130–450)
RBC: 3.78 E12/L (ref 3.50–5.50)
RDW: 14 fL (ref 11.5–15.0)
Seg Neutrophils: 83 % — ABNORMAL HIGH (ref 43–80)
WBC: 11.3 E9/L (ref 4.5–11.5)

## 2011-11-03 MED ADMIN — pantoprazole (PROTONIX) tablet 40 mg: ORAL | @ 11:00:00 | NDC 47335058081

## 2011-11-03 MED ADMIN — docusate sodium (COLACE) capsule 100 mg: ORAL | @ 15:00:00 | NDC 63739047801

## 2011-11-03 MED ADMIN — docusate sodium (COLACE) capsule 100 mg: ORAL | @ 02:00:00 | NDC 63739047801

## 2011-11-03 MED ADMIN — morphine sulfate (PF) injection 5 mg: INTRAMUSCULAR | @ 23:00:00 | NDC 00409189201

## 2011-11-03 MED ADMIN — ceFAZolin (ANCEF) 2 g in dextrose 5 % 100 mL IVPB: INTRAVENOUS | @ 05:00:00 | NDC 00781345170

## 2011-11-03 MED ADMIN — lisinopril (PRINIVIL;ZESTRIL) tablet 10 mg: ORAL | @ 18:00:00 | NDC 51079098217

## 2011-11-03 MED ADMIN — morphine sulfate (PF) injection 5 mg: INTRAMUSCULAR | @ 01:00:00 | NDC 00409189201

## 2011-11-03 MED ADMIN — fluticasone-salmeterol (ADVAIR) 250-50 MCG/DOSE AEPB 1 puff: RESPIRATORY_TRACT | @ 02:00:00 | NDC 00173069604

## 2011-11-03 MED ADMIN — senna-docusate (PERICOLACE) 8.6-50 MG per tablet 3 tablet: ORAL | @ 02:00:00 | NDC 51645085099

## 2011-11-03 MED ADMIN — fluticasone (FLONASE) 50 MCG/ACT nasal spray 2 spray: NASAL | @ 15:00:00 | NDC 50383070016

## 2011-11-03 MED ADMIN — promethazine (PHENERGAN) injection 25 mg: INTRAMUSCULAR | @ 01:00:00 | NDC 00641092821

## 2011-11-03 MED ADMIN — enoxaparin (LOVENOX) injection 30 mg: SUBCUTANEOUS | @ 14:00:00 | NDC 00075062430

## 2011-11-03 MED ADMIN — morphine (PF) injection 2 mg: INTRAMUSCULAR | @ 14:00:00 | NDC 00409189001

## 2011-11-03 MED ADMIN — morphine sulfate (PF) injection 5 mg: INTRAMUSCULAR | @ 10:00:00 | NDC 00409189201

## 2011-11-03 MED ADMIN — ceFAZolin (ANCEF) 2 g in dextrose 5 % 100 mL IVPB: INTRAVENOUS | @ 17:00:00 | NDC 00781345170

## 2011-11-03 MED ADMIN — promethazine (PHENERGAN) injection 25 mg: INTRAMUSCULAR | @ 10:00:00 | NDC 00641092821

## 2011-11-03 MED ADMIN — HYDROcodone-acetaminophen (NORCO) 5-325 MG per tablet 2 tablet: 2 | ORAL | @ 18:00:00 | NDC 50268040315

## 2011-11-03 MED ADMIN — HYDROcodone-acetaminophen (NORCO) 5-325 MG per tablet 2 tablet: 2 | ORAL | @ 11:00:00 | NDC 50268040315

## 2011-11-03 MED ADMIN — HYDROcodone-acetaminophen (NORCO) 5-325 MG per tablet 2 tablet: 2 | ORAL | @ 05:00:00 | NDC 00406036562

## 2011-11-03 MED FILL — BENZONATATE 100 MG PO CAPS: 100 MG | ORAL | Qty: 2

## 2011-11-03 MED FILL — MORPHINE SULFATE (PF) 2 MG/ML IV SOLN: 2 mg/mL | INTRAVENOUS | Qty: 1

## 2011-11-03 MED FILL — GENATON PO CHEW: ORAL | Qty: 1

## 2011-11-03 MED FILL — CEFAZOLIN SODIUM 1 G IJ SOLR: 1 g | INTRAMUSCULAR | Qty: 2000

## 2011-11-03 MED FILL — PROMETHAZINE HCL 25 MG/ML IJ SOLN: 25 MG/ML | INTRAMUSCULAR | Qty: 1

## 2011-11-03 MED FILL — DOCUSATE SODIUM 100 MG PO CAPS: 100 MG | ORAL | Qty: 1

## 2011-11-03 MED FILL — PRAVASTATIN SODIUM 20 MG PO TABS: 20 MG | ORAL | Qty: 4

## 2011-11-03 MED FILL — HYDROCODONE-ACETAMINOPHEN 5-325 MG PO TABS: 5-325 MG | ORAL | Qty: 2

## 2011-11-03 MED FILL — MORPHINE SULFATE (PF) 8 MG/ML IV SOLN: 8 mg/mL | INTRAVENOUS | Qty: 1

## 2011-11-03 MED FILL — PANTOPRAZOLE SODIUM 40 MG PO TBEC: 40 MG | ORAL | Qty: 1

## 2011-11-03 MED FILL — LISINOPRIL 10 MG PO TABS: 10 MG | ORAL | Qty: 1

## 2011-11-03 MED FILL — MIDAZOLAM HCL 2 MG/2ML IJ SOLN: 2 MG/ML | INTRAMUSCULAR | Qty: 2

## 2011-11-03 MED FILL — LOVENOX 30 MG/0.3ML SC SOLN: 30 MG/0.3ML | SUBCUTANEOUS | Qty: 0.3

## 2011-11-03 MED FILL — NORMAL SALINE FLUSH 0.9 % IV SOLN: 0.9 % | INTRAVENOUS | Qty: 10

## 2011-11-03 MED FILL — DIPRIVAN 10 MG/ML IV EMUL: 10 MG/ML | INTRAVENOUS | Qty: 20

## 2011-11-03 MED FILL — FENTANYL CITRATE 0.05 MG/ML IJ SOLN: 0.05 MG/ML | INTRAMUSCULAR | Qty: 5

## 2011-11-03 MED FILL — MORPHINE SULFATE 10 MG/ML IJ SOLN: 10 MG/ML | INTRAMUSCULAR | Qty: 1

## 2011-11-03 MED FILL — FLUTICASONE PROPIONATE 50 MCG/ACT NA SUSP: 50 MCG/ACT | NASAL | Qty: 16

## 2011-11-03 MED FILL — DOK PLUS 8.6-50 MG PO TABS: ORAL | Qty: 3

## 2011-11-03 NOTE — Discharge Instructions (Signed)
LEFT KNEE TOTAL JOINT ARTHROPLASTY      DATE OF SURGERY: 11/02/11    SURGEON: DR. Marcy Salvo  BONIFACE    REMOVE STAPLES August 10 th    DAILY DRESSING CHANGE    INCISION OPEN TO AIR WHEN DRAINAGE CEASES    MAY SHOWER    MAY USE AN ANTIBACTERIAL SOAP ON INCISION OR JUST LET WATER ROLL OFF INCISION LINE    MAY USE SHOWER CHAIR FOR SAFETY    TED HOSE FOR 4-6 WEEKS POST OP    REMOVE STOCKINGS EVERY 8 HOURS FOR 20-30 MINUTES AND CHECK SKIN INTEGRITY BILATERAL LOWER EXTREMITIES    CPM MACHINE DAY AND EVENING SHIFT ONLY / OFF AT BEDTIME (10PM-7AM)    LIMIT EACH SESSION TO 1-4 HOURS TO PATIENT TOLERATION    INCREASE FLEXION 10 DEGREES DAILY OR MORE TO PATIENT TOLERATION    DISCONTINUE CPM MACHINE WHEN 110 DEGREES KNEE FLEXION HAS BEEN REACHED    INSTRUCT PATIENT TO CONTINUE KNEE EXERCISES AS INSTRUCTED BY THE THERAPIST    PHYSICAL THERAPY EVALUATE AND TREAT    GAIT TRAINING WITH WALKER     WEIGHT BEARING STATUS:  FfULL WEIGHT BEARING LEFT LOWER EXTREMITY    ROM / STRENGTHENING/ MODALITIES    FOLLOW UP WITH DR. Marcy Salvo BONIFACE AFTER DISCHARGE FROM REHAB FOR EXAMINATION AND PRESCRIPTION FOR OUTPATIENT PHYSICAL THERAPY    CALL OFFICE FOR APPOINTMENT [phone]

## 2011-11-03 NOTE — Progress Notes (Signed)
Department of Orthopedic Surgery  Resident Progress Note      SUBJECTIVE  Pt seen and examined. Pain controlled. No new complaints. States will be going to rehab on discharge.  Up out of bed with PT yesterday.  States she is doing well.    OBJECTIVE    Physical    VITALS:  BP 131/66   Pulse 95   Temp(Src) 98.2 ??F (36.8 ??C) (Oral)   Resp 16   Ht 5' (1.524 m)   Wt 210 lb (95.255 kg)   BMI 41.01 kg/m2   SpO2 98%    left LE:   ?? Dressing C/D/I  ?? Distal sensory intact  ?? +2/4 PT and DP  ?? +PF/DF/EHL  ?? Compartments soft and compressible    Data    CBC:   Lab Results   Component Value Date    WBC 7.6 10/26/2011    RBC 4.38 10/26/2011    HGB 13.9 10/26/2011    HCT 41.8 10/26/2011    MCV 95.5 10/26/2011    MCH 31.9 10/26/2011    MCHC 33.4 10/26/2011    RDW 13.6 10/26/2011    PLT 228 10/26/2011    MPV 8.2 10/26/2011     PT/INR:    No results found for this basename: PROTIME, INR         ASSESSMENT   ?? POD 1 s/p left TKA    PLAN    ?? Pain control  ?? DVT prophylaxis -- Lovenox  ?? PT/OT  ?? Discharge planning  ?? Dressing change tomorrow  ?? CBC pending this AM -- will monitor Hgb

## 2011-11-03 NOTE — Progress Notes (Signed)
Physical Therapy  Daily Treatment Note  Date: 11/03/2011  Patient Name: Christina Robbins  MRN: [id number]     DOB: Jan 18, 1946  Recommendation for discharge: Acute rehab vs HHPT w/ A  Room # 5214 A  Diagnosis: L knee OA, L TKA 11/02/11  Precautions: WBAT L LE      Patient cleared by nursing for treatment.  Patient is agreeable to treatment.    Pain level is: no c/o pain   Balance: fair w/ ww     Functional Mobility  Bed Mobility  Rolling: NT  Supine to sit: CG/min A of L LE  Sit to supine: CG/min A of L LE  Scooting: CGA of LLE to EOB and in bed     Transfers  Sit to stand: min A cues for safety  Stand to sit: min A cues for safety  Stand pivot: CGA cues to not twist on L LE w/ directional changes     Locomotion  Ambulation: 32feet x 1 reps WBAT BLE with CGA assistance with ww device.  Cues for sequencing, heel- toe gait.      Pt performed therapeutic exercise of the following: AP, QS , heel slides x 15 ea limited by ace wrap.         Pt educated on sequencing w/ gait  Pt's response to education:  Pt verbalized understanding Pt demonstrated understanding Pt unable to verbalize/ demonstrate  understanding  No further education required in this area Pt would benefit from further education   x x   x       Call light left by patient.  Pt is making good progress towards physical therapy goals.  Continue with physical therapy  Time : 1300 NA eating               1330 - [id number]    License Number: Jonette Mate PTA # [id number]

## 2011-11-03 NOTE — Progress Notes (Signed)
SS NOTE: Met with pt and husband, Chrissie Noa [phone]. Pt was residing in he own 2 story home with 3 step entry with railing.  Bed and bathroom are on the 2nd floor.  Pt has no hx of hhc or SAR/rehab.  Pt does have a CPap at home from [employer]. Pt requesting SAR placement at [facility].  They do have a bed and will begin precert with insurance.  For SAR pt will need a signed N17, updated H&P and current PT/OT notes.  Curt Jews Davie Sagona. 11/03/2011.2:40 PM.

## 2011-11-03 NOTE — Progress Notes (Signed)
Physical Therapy  Daily Treatment Note  Date: 11/03/2011  Patient Name: Christina Robbins  MRN: [id number]     DOB: June 24, 1945  Recommendation for discharge: HHPT w/ A  Room # 5214 A  Diagnosis: L knee OA, L TKA 11/02/11  Precautions: WBAT L LE      Patient cleared by nursing for treatment.  Patient is agreeable to treatment.    Pain level is: no c/o pain   Balance: fair      Functional Mobility  Bed Mobility  Rolling: NT  Supine to sit: min A  Sit to supine: min A  Scooting: min A  Cues for technique  Sat EOB  x 7 mins supervision  Transfers  Sit to stand: min A   Stand to sit: min A   Stand pivot: NT  Cues for technique/safety     Locomotion  Ambulation: 6 steps x 1 reps WBAT BLE with CG/min A assistance with ww  device.  Cues for sequencing, pt fearful of putting wt through L LE but improved w/ cuing.   Remained in B/S chair w/ L LE supported on pillow d/t Ace wrapping limited knee rom      Pt performed therapeutic exercise of the following: Supine : AP, QS x 15 ea AROM,  Knee flex L LE AAROM x 15.  Limited by pain and ace wrap        Pt educated on functional mobility as noted above  Pt's response to education:  Pt verbalized understanding Pt demonstrated understanding Pt unable to verbalize/ demonstrate  understanding  No further education required in this area Pt would benefit from further education   x X w/ cues   x           Call light left by patient.  Pt is making good progress towards physical therapy goals.  Continue with physical therapy  Time : 758 - 621    License Number: Jonette Mate PTA # [phone]

## 2011-11-03 NOTE — Progress Notes (Signed)
Addendum to today's Progress Note: CBC    Pre Op   10/26/11  Hgb.13.9  Hct.41.8  Platelet Count 228    Post Op 11/03/11 :Hgb.12.0  Hct.36.6  Platelet Count 220    Acute Blood Loss / Anemia

## 2011-11-03 NOTE — Progress Notes (Signed)
Unable to place ted hose due to the size of patient's leg.

## 2011-11-04 DIAGNOSIS — M1712 Unilateral primary osteoarthritis, left knee: Secondary | ICD-10-CM

## 2011-11-04 LAB — CBC
Hematocrit: 36.7 % (ref 34.0–48.0)
Hemoglobin: 12.2 g/dL (ref 11.5–15.5)
MCH: 31.9 pg (ref 26.0–35.0)
MCHC: 33.2 % (ref 32.0–34.5)
MCV: 96.3 fL (ref 80.0–99.9)
MPV: 8.1 fL (ref 7.0–12.0)
Platelets: 227 E9/L (ref 130–450)
RBC: 3.82 E12/L (ref 3.50–5.50)
RDW: 14.1 fL (ref 11.5–15.0)
WBC: 11.7 E9/L — ABNORMAL HIGH (ref 4.5–11.5)

## 2011-11-04 LAB — POCT GLUCOSE
POC Glucose: 115 mg/dL — ABNORMAL HIGH (ref 70–110)
POC Glucose: 93 mg/dL (ref 70–110)

## 2011-11-04 MED ORDER — BISACODYL 10 MG RE SUPP
10 MG | RECTAL | Status: DC | PRN
Start: 2011-11-04 — End: 2012-08-31

## 2011-11-04 MED ORDER — HYDROCODONE-ACETAMINOPHEN 5-325 MG PO TABS
5-325 MG | ORAL_TABLET | Freq: Four times a day (QID) | ORAL | Status: DC | PRN
Start: 2011-11-04 — End: 2011-11-05

## 2011-11-04 MED ADMIN — promethazine (PHENERGAN) injection 25 mg: INTRAMUSCULAR | @ 03:00:00 | NDC 00641092821

## 2011-11-04 MED ADMIN — promethazine (PHENERGAN) injection 25 mg: INTRAMUSCULAR | @ 11:00:00 | NDC 00641092821

## 2011-11-04 MED ADMIN — lisinopril (PRINIVIL;ZESTRIL) tablet 10 mg: ORAL | @ 13:00:00 | NDC 51079098217

## 2011-11-04 MED ADMIN — pantoprazole (PROTONIX) tablet 40 mg: ORAL | @ 11:00:00 | NDC 47335058081

## 2011-11-04 MED ADMIN — pravastatin (PRAVACHOL) tablet 80 mg: ORAL | @ 13:00:00 | NDC 00904589261

## 2011-11-04 MED ADMIN — enoxaparin (LOVENOX) injection 30 mg: SUBCUTANEOUS | @ 13:00:00 | NDC 00075062430

## 2011-11-04 MED ADMIN — fluticasone-salmeterol (ADVAIR) 250-50 MCG/DOSE AEPB 1 puff: RESPIRATORY_TRACT | @ 03:00:00 | NDC 00173069604

## 2011-11-04 MED ADMIN — docusate sodium (COLACE) capsule 100 mg: ORAL | @ 03:00:00 | NDC 63739047801

## 2011-11-04 MED ADMIN — morphine sulfate (PF) injection 5 mg: INTRAMUSCULAR | @ 03:00:00 | NDC 00409189201

## 2011-11-04 MED ADMIN — enoxaparin (LOVENOX) injection 30 mg: SUBCUTANEOUS | @ 03:00:00 | NDC 00075062430

## 2011-11-04 MED ADMIN — morphine sulfate (PF) injection 5 mg: INTRAMUSCULAR | @ 11:00:00 | NDC 00409189201

## 2011-11-04 MED ADMIN — docusate sodium (COLACE) capsule 100 mg: ORAL | @ 13:00:00 | NDC 63739047801

## 2011-11-04 MED ADMIN — morphine sulfate (PF) injection 5 mg: INTRAMUSCULAR | @ 15:00:00 | NDC 00409189201

## 2011-11-04 MED ADMIN — fluticasone-salmeterol (ADVAIR) 250-50 MCG/DOSE AEPB 1 puff: RESPIRATORY_TRACT | @ 13:00:00 | NDC 00173069604

## 2011-11-04 MED ADMIN — HYDROcodone-acetaminophen (NORCO) 5-325 MG per tablet 2 tablet: 2 | ORAL | @ 20:00:00 | NDC 50268040315

## 2011-11-04 MED ADMIN — HYDROcodone-acetaminophen (NORCO) 5-325 MG per tablet 2 tablet: 2 | ORAL | @ 06:00:00 | NDC 50268040315

## 2011-11-04 MED ADMIN — HYDROcodone-acetaminophen (NORCO) 5-325 MG per tablet 2 tablet: 2 | ORAL | NDC 50268040315

## 2011-11-04 MED ADMIN — HYDROcodone-acetaminophen (NORCO) 5-325 MG per tablet 2 tablet: 2 | ORAL | @ 13:00:00 | NDC 50268040315

## 2011-11-04 MED FILL — GENATON PO CHEW: ORAL | Qty: 1

## 2011-11-04 MED FILL — HYDROCODONE-ACETAMINOPHEN 5-325 MG PO TABS: 5-325 MG | ORAL | Qty: 2

## 2011-11-04 MED FILL — MORPHINE SULFATE (PF) 8 MG/ML IV SOLN: 8 mg/mL | INTRAVENOUS | Qty: 1

## 2011-11-04 MED FILL — LISINOPRIL 10 MG PO TABS: 10 MG | ORAL | Qty: 1

## 2011-11-04 MED FILL — PRAVASTATIN SODIUM 20 MG PO TABS: 20 MG | ORAL | Qty: 4

## 2011-11-04 MED FILL — FLUTICASONE PROPIONATE 50 MCG/ACT NA SUSP: 50 MCG/ACT | NASAL | Qty: 16

## 2011-11-04 MED FILL — BENZONATATE 100 MG PO CAPS: 100 MG | ORAL | Qty: 2

## 2011-11-04 MED FILL — LOVENOX 30 MG/0.3ML SC SOLN: 30 MG/0.3ML | SUBCUTANEOUS | Qty: 0.3

## 2011-11-04 MED FILL — NORMAL SALINE FLUSH 0.9 % IV SOLN: 0.9 % | INTRAVENOUS | Qty: 10

## 2011-11-04 MED FILL — PROMETHAZINE HCL 25 MG/ML IJ SOLN: 25 MG/ML | INTRAMUSCULAR | Qty: 1

## 2011-11-04 MED FILL — DOCUSATE SODIUM 100 MG PO CAPS: 100 MG | ORAL | Qty: 1

## 2011-11-04 MED FILL — PANTOPRAZOLE SODIUM 40 MG PO TBEC: 40 MG | ORAL | Qty: 1

## 2011-11-04 NOTE — Progress Notes (Signed)
SS NOTE:  [facility] does have a bed and are awaiting precert with insurance.  For SAR pt will need a signed N17, updated H&P and current PT/OT notes. 7000 completed.  Curt Jews Alok Minshall. 11/04/2011 11:41AM.

## 2011-11-04 NOTE — Progress Notes (Signed)
IM letter mailed to husband.

## 2011-11-04 NOTE — Progress Notes (Signed)
Admit Date: 11/02/2011                                PCP: Renata Caprice, DO    Principal Problem:    Osteoarthritis of left knee  Active Problems:    Hyperlipidemia LDL goal < 100    Hypertension, essential, benign      Subjective:    ?? Feels well.  Having expected pain.      Vitals:  VITALS:  BP 118/64   Pulse 90   Temp(Src) 98.3 ??F (36.8 ??C) (Oral)   Resp 18   Ht 5' (1.524 m)   Wt 210 lb (95.255 kg)   BMI 41.01 kg/m2   SpO2 98%    24HR INTAKE/OUTPUT:    Intake/Output Summary (Last 24 hours) at 11/04/11 0727  Last data filed at 11/03/11 2034   Gross per 24 hour   Intake    960 ml   Output      0 ml   Net    960 ml     24HR PULSE OXIMETRY RANGE:  No Data Recorded  CVP:      Medications:  IV:       dextrose       Scheduled Meds:       ??? lisinopril  10 mg Oral Daily   ??? docusate sodium  100 mg Oral BID   ??? fluticasone  2 spray Each Nare Daily   ??? pantoprazole  40 mg Oral QAM AC   ??? pravastatin  80 mg Oral Daily   ??? sodium chloride  10 mL Intravenous Q12H SCH   ??? enoxaparin (LOVENOX) injection  30 mg Subcutaneous BID   ??? fluticasone-salmeterol  1 puff Inhalation BID   ??? senna-docusate  3 tablet Oral Every Other Day       Diet:   Carb Control     EXAM:  General: No distress. Alert.  Eyes: PERRL. No sclera icterus. No conjunctival injection.  ENT: No discharge. Pharynx clear.  Neck: Trachea midline. Normal thyroid.  Resp: No accessory muscle use. No crackles. No wheezing. No rhonchi.   CV: RRR without murmur.  Abd: Non-tender. Non-distended. No masses. No organomegaly. Normal bowel sounds.   Skin: Warm and dry. No nodule on exposed extremities. No rash on exposed extremities.  Ext: No cyanosis, clubbing, edema. Left knee dressed.  Lymph: No cervical LAD. No supraclavicular LAD.   M/S: No cyanosis. No joint deformity. No clubbing.   Neuro: Awake. Follows commands. Positive pupils/gag/corneals. Normal pain response.       Results:  CBC: Recent Labs      11/03/11   0610   WBC  11.3   HGB  12.0   HCT  36.5   MCV  96.8    PLT  220     BMP: No results found for this basename: NA, K, CL, CO2, PHOS, BUN, CREATININE, CA,  in the last 72 hours  LIVER PROFILE: No results found for this basename: AST, ALT, LIPASE,  AMYLASE,  ALB, BILIDIR, BILITOT, ALKPHOS,  in the last 72 hours  PT/INR: No results found for this basename: PROTIME, INR,  in the last 72 hours  APTT: No results found for this basename: APTT,  in the last 72 hours      Today's Cultures:                1.      Today's  ABG:                 1.       Radiology:    Xr Knee Left Limited Portable    11/02/2011  Portable?->portable Exam Start [id number] Left knee- Two views- Indications- Pain, postop, osteoarthritis.   There is a left knee arthroplasty. Bones are in anatomic alignment. Surgical clips are present.  Impression-  Left knee arthroplasty.  If there are any physician concerns regarding this report, a Radiologist can be reached by calling the following number [phone].        Transcriptionist- AMJ       Read ByJone Baseman   M.D.      Released ByJone Baseman   M.D.      Released Date Time- 11/02/11 1616  ------------------------------------------------------------------------------   1.     Assessment:    1. Osteoarthritis left knee  2. Hypertension controlled  3. Hyperlipidemia        Plan:  1. To [employer].    Care reviewed with the patient's family as available.        Renata Caprice,  D.O.

## 2011-11-04 NOTE — Progress Notes (Signed)
Department of Orthopedic Surgery  Resident Progress Note      SUBJECTIVE  Pt seen and examined. Pain controlled. No new complaints. [facility] on discharge.  Up out of bed with PT yesterday 50 feet.  States she is doing well. Denies BM. Good appetite.     OBJECTIVE    Physical    VITALS:  BP 126/67   Pulse 94   Temp(Src) 98.6 ??F (37 ??C) (Oral)   Resp 18   Ht 5' (1.524 m)   Wt 210 lb (95.255 kg)   BMI 41.01 kg/m2   SpO2 98%    left LE:   ?? Dressing changed- Incision C/D/I  ?? Distal sensory intact  ?? +2/4 PT and DP  ?? +PF/DF/EHL  ?? Compartments soft and compressible    Data    CBC:   Lab Results   Component Value Date    WBC 11.3 11/03/2011    RBC 3.78 11/03/2011    HGB 12.0 11/03/2011    HCT 36.5 11/03/2011    MCV 96.8 11/03/2011    MCH 31.9 11/03/2011    MCHC 33.0 11/03/2011    RDW 14.0 11/03/2011    PLT 220 11/03/2011    MPV 8.2 11/03/2011     PT/INR:    No results found for this basename: PROTIME,  INR         ASSESSMENT   ?? POD 2 s/p left TKA    PLAN    ?? Pain control  ?? DVT prophylaxis -- Lovenox. TEDs  ?? PT/OT  ?? Discharge planning  ?? Discuss with attending.

## 2011-11-04 NOTE — Progress Notes (Addendum)
Pt was on CPM @ 58 from 1900-2100. Tolerated well. Was in pain and medicated pt. Pt resting comfortably.

## 2011-11-04 NOTE — Progress Notes (Signed)
Addendum:    Hgb.12.2  Hct.36.7  Platelet Count 227  WBC's 11.7 (slightly elevated from 11/03/11)    Patient denied dizziness, lightheadedness, SOB or CP.    Plan:  Check CBC in am.             Encourage coughing and deep breathing, incentive spirometer.

## 2011-11-04 NOTE — Progress Notes (Signed)
Physical Therapy  Daily Treatment Note  Date: 11/04/2011  Patient Name: Christina Robbins  MRN: [id number]     DOB: 13-Mar-1946  Recommendation for discharge: acute rehab  Room # 5214 A  Diagnosis: L knee OA, L TKA 11/02/11  Precautions: WBAT L LE      Patient cleared by nursing for treatment.  Patient is agreeable to treatment.    Pain level is: c/o abdominal pain from inability to move bowels, ( nsg aware ) , and reports " R ring finger and pinky are numb "  nsg aware.   Balance: fair w / ww     Functional Mobility  Bed Mobility  Rolling : NT  Supine to sit: Cg/min A of L LE   Sit to supine: NT  Scooting: CGA of L LE to EOB  Cues for technique     Transfers  Sit to stand: min A  Stand to sit: min A  Stand pivot: CGA   Cues for technique     Locomotion  Ambulation: 45 feet x 1 reps WBAT BLE with SB/CGA assistance with ww device.  Pt returned to sit on B/S commode , nsg aware.       Pt performed therapeutic exercise of the following: supine : AP, QS x 15 AROM, heel slides L LE AAROM x 15        Pt educated on bed mobility technique  Pt's response to education:  Pt verbalized understanding Pt demonstrated understanding Pt unable to verbalize/ demonstrate  understanding  No further education required in this area Pt would benefit from further education   x x   x     Call light left by patient.  Pt is making good progress towards physical therapy goals.  Continue with physical therapy  Time : 810  NA eating breakfast             830  NA bathing              910 - 401    License Number: Jonette Mate PTA # [phone]

## 2011-11-04 NOTE — Progress Notes (Signed)
Physical Therapy  Daily Treatment Note  Date: 11/04/2011  Patient Name: Adylin Piske  MRN: [id number]     DOB: [birth year]  Recommendation for discharge: Acute rehab  Room # 5214 A  Diagnosis: L knee OA, L TKA 11/02/11  Precautions: WBAT L LE      Patient cleared by nursing for treatment.  Patient is agreeable to treatment.    Pain level is: no c/o pain   Balance: fair      Functional Mobility  Bed Mobility  Rolling: NT  Supine to sit: CGA of L LE to EOB  Sit to supine: NT  Scooting: CGA of L LE to EOB     Transfers  Sit to stand: min A   Stand to sit: min A cues for safety  Stand pivot: CGA cues to not twist L LE w/ directional change     Locomotion  Ambulation: 50 feet x 1 reps WBAT BLE with CGA assistance with ww device.  Slow guarded cadence, cues for heel - toe gait  Pt remained OOB in B/S chair , nsg aware.        Pt educated on safety w/ mobility noted above  Pt's response to education:  Pt verbalized understanding Pt demonstrated understanding Pt unable to verbalize/ demonstrate  understanding  No further education required in this area Pt would benefit from further education   x x   x       Call light left by patient.  Pt is making good progress towards physical therapy goals.  Continue with physical therapy  Time : 1320 - 8657    License Number: Jonette Mate PTA # [phone]

## 2011-11-05 LAB — CBC
Hematocrit: 32.4 % — ABNORMAL LOW (ref 34.0–48.0)
Hemoglobin: 10.9 g/dL — ABNORMAL LOW (ref 11.5–15.5)
MCH: 32.3 pg (ref 26.0–35.0)
MCHC: 33.6 % (ref 32.0–34.5)
MCV: 96.1 fL (ref 80.0–99.9)
MPV: 8 fL (ref 7.0–12.0)
Platelets: 208 E9/L (ref 130–450)
RBC: 3.37 E12/L — ABNORMAL LOW (ref 3.50–5.50)
RDW: 13.6 fL (ref 11.5–15.0)
WBC: 8.8 E9/L (ref 4.5–11.5)

## 2011-11-05 MED ORDER — HYDROCODONE-ACETAMINOPHEN 5-325 MG PO TABS
5-325 MG | ORAL_TABLET | Freq: Four times a day (QID) | ORAL | Status: AC | PRN
Start: 2011-11-05 — End: 2011-11-12

## 2011-11-05 MED ORDER — ASPIRIN 81 MG PO CHEW
81 MG | ORAL_TABLET | Freq: Every day | ORAL | Status: DC
Start: 2011-11-05 — End: 2012-08-31

## 2011-11-05 MED ADMIN — pantoprazole (PROTONIX) tablet 40 mg: ORAL | @ 11:00:00 | NDC 00904623561

## 2011-11-05 MED ADMIN — fluticasone-salmeterol (ADVAIR) 250-50 MCG/DOSE AEPB 1 puff: RESPIRATORY_TRACT | @ 14:00:00 | NDC 00173069604

## 2011-11-05 MED ADMIN — lisinopril (PRINIVIL;ZESTRIL) tablet 10 mg: ORAL | @ 14:00:00 | NDC 51079098217

## 2011-11-05 MED ADMIN — pravastatin (PRAVACHOL) tablet 80 mg: ORAL | @ 14:00:00 | NDC 00904589261

## 2011-11-05 MED ADMIN — docusate sodium (COLACE) capsule 100 mg: ORAL | @ 01:00:00 | NDC 63739047801

## 2011-11-05 MED ADMIN — senna-docusate (PERICOLACE) 8.6-50 MG per tablet 3 tablet: ORAL | @ 01:00:00 | NDC 51645085099

## 2011-11-05 MED ADMIN — sodium chloride 0.9 % injection 10 mL: INTRAVENOUS | @ 01:00:00

## 2011-11-05 MED ADMIN — morphine sulfate (PF) injection 5 mg: INTRAMUSCULAR | @ 01:00:00 | NDC 00409189201

## 2011-11-05 MED ADMIN — enoxaparin (LOVENOX) injection 30 mg: SUBCUTANEOUS | @ 14:00:00 | NDC 00075062430

## 2011-11-05 MED ADMIN — enoxaparin (LOVENOX) injection 30 mg: SUBCUTANEOUS | @ 01:00:00 | NDC 00075062430

## 2011-11-05 MED ADMIN — docusate sodium (COLACE) capsule 100 mg: ORAL | @ 14:00:00 | NDC 63739047801

## 2011-11-05 MED ADMIN — HYDROcodone-acetaminophen (NORCO) 5-325 MG per tablet 2 tablet: 2 | ORAL | @ 14:00:00 | NDC 50268040315

## 2011-11-05 MED ADMIN — HYDROcodone-acetaminophen (NORCO) 5-325 MG per tablet 2 tablet: 2 | ORAL | @ 07:00:00 | NDC 50268040315

## 2011-11-05 MED FILL — LISINOPRIL 10 MG PO TABS: 10 MG | ORAL | Qty: 1

## 2011-11-05 MED FILL — HYDROCODONE-ACETAMINOPHEN 5-325 MG PO TABS: 5-325 MG | ORAL | Qty: 2

## 2011-11-05 MED FILL — BENZONATATE 100 MG PO CAPS: 100 MG | ORAL | Qty: 2

## 2011-11-05 MED FILL — LOVENOX 30 MG/0.3ML SC SOLN: 30 MG/0.3ML | SUBCUTANEOUS | Qty: 0.3

## 2011-11-05 MED FILL — PRAVASTATIN SODIUM 20 MG PO TABS: 20 MG | ORAL | Qty: 4

## 2011-11-05 MED FILL — GENATON PO CHEW: ORAL | Qty: 1

## 2011-11-05 MED FILL — DOCUSATE SODIUM 100 MG PO CAPS: 100 MG | ORAL | Qty: 1

## 2011-11-05 MED FILL — DOK PLUS 8.6-50 MG PO TABS: ORAL | Qty: 3

## 2011-11-05 MED FILL — FLUTICASONE PROPIONATE 50 MCG/ACT NA SUSP: 50 MCG/ACT | NASAL | Qty: 16

## 2011-11-05 MED FILL — NORMAL SALINE FLUSH 0.9 % IV SOLN: 0.9 % | INTRAVENOUS | Qty: 10

## 2011-11-05 MED FILL — PANTOPRAZOLE SODIUM 40 MG PO TBEC: 40 MG | ORAL | Qty: 1

## 2011-11-05 MED FILL — MORPHINE SULFATE (PF) 8 MG/ML IV SOLN: 8 MG/ML | INTRAVENOUS | Qty: 1

## 2011-11-05 NOTE — Progress Notes (Signed)
Physical Therapy  Daily Treatment Note  Date: 11/05/2011  Patient Name: Christina Robbins  MRN: [id number]     DOB: November 07, 1945  Recommendation for discharge: acute rehab  Room # 5214 A  Diagnosis: L knee OA, L TKA 11/02/11  Precautions: WBAT L LE      Patient cleared by nursing for treatment.  Patient is agreeable to treatment.    Pain level is: 9/10 in L knee , but also c/o R sh.pain, and has a heating pad on it.  Pt reports phys. Is aware.        Functional Mobility  Pt refused functional mobility this am d/t pain and fatigue from OT treatment.      Pt performed therapeutic exercise of the following: B LE :  AP, QS x 20 ea AROM.  Heel slides, ABD/ADD x 2 0 ea L AAROM, guarded , R LE  AROM.     Pt declined car transfer as she was told by SW  she has [facility] transportation to rehab facility.   Call light left by patient.    Continue with physical therapy  Time : 900 NA w/ OTA             920 - 540    License Number: Jonette Mate PTA # [phone]

## 2011-11-05 NOTE — Progress Notes (Signed)
Department of Orthopedic Surgery  Resident Progress Note      SUBJECTIVE  Pt seen and examined. Pain controlled. No new complaints. [employer] today.  OBJECTIVE    Physical    VITALS:  BP 132/64   Pulse 98   Temp(Src) 98.4 ??F (36.9 ??C) (Oral)   Resp 16   Ht 5' (1.524 m)   Wt 210 lb (95.255 kg)   BMI 41.01 kg/m2   SpO2 96%    left LE:   ?? Dressing C/D/I  ?? Distal sensory intact  ?? +2/4 PT and DP  ?? +PF/DF/EHL  ?? Compartments soft and compressible    Data    CBC:   Lab Results   Component Value Date    WBC 8.8 11/05/2011    RBC 3.37 11/05/2011    HGB 10.9 11/05/2011    HCT 32.4 11/05/2011    MCV 96.1 11/05/2011    MCH 32.3 11/05/2011    MCHC 33.6 11/05/2011    RDW 13.6 11/05/2011    PLT 208 11/05/2011    MPV 8.0 11/05/2011     PT/INR:    No results found for this basename: PROTIME,  INR         ASSESSMENT   ?? POD 3 s/p left TKA    PLAN    ?? Pain control  ?? DVT prophylaxis -- Lovenox. TEDs  ?? PT/OT  ?? Discharge to [facility] pending today  ?? Follow up with Dr. Loman Chroman in office at previously scheduled appointment

## 2011-11-05 NOTE — Progress Notes (Signed)
SS NOTE: Arrangements completed for pt to be transferred to [facility] today at 12:00PM via [facility].  Pt aware of this plan.  For SAR pt will need a signed N17, updated H&P and current PT/OT notes.  Curt Jews Ronneisha Jett. 11/05/2011 10:32AM.

## 2011-11-05 NOTE — Progress Notes (Signed)
Nurse to nurse called to [facility] Forms faxed.

## 2011-11-05 NOTE — Progress Notes (Signed)
OT BEDSIDE TREATMENT NOTE      Date:11/05/2011  Patient Name: Christina Robbins  MRN: [id number]  DOB: May 15, 1945  Room: 5214/5214-A   Diagnosis:  Lt TKA   Patient Active Problem List   Diagnosis   . Preoperative testing   . Osteoarthritis of left knee   . Hyperlipidemia LDL goal < 100   . Hypertension, essential, benign       Precautions: Fall risk, WBAT    S:   Nursing staff approve OT intervention.    Reported 8/10 Lt LE and Rt shoulder pain. Heating pad placed on shoulder and nurse made aware.   O:  Short term goal 1: Increase ADLS to MOD I  UB dressing requires set up assistance.   Educated pt on use of AE for LB dressing. Minimal assistance required for pulling clothing over hips.   Short term goal 2: Increase func. transfers & mobility to MOD I  Supine to from sit minimal assistance for Lt LE mvmt.   Sit to stand requires minimal assistance.  Short distance mobility with use of FWW completed with minimal assistance.   Requires cueing for safety, hand placement, and posture.   Short term goal 3: Increase func. endurance to [facility] for lt./mod. Ax.  Limited by pain and fatigue.   Multiple rest periods required through out 45 min session.   A:   Patient has made good  progress during treatment session toward set goals. Therapy emphasis to obtain goals: ADLs, IADLs, Endurance, Transfers, Mobility, Safety awareness.      Caregiver requires education in proper body mechanics, transfers, and over all safety / fall prevention. Hands on training recommended.   Pt would benefit from additional ADLs, safety education, balance activities, transfer training, strength exercises, and over all endurance building.   Recommended Adaptive Equipment: walker, bedside commode and hip kit   Recommended Discharge:    [x]  Inpatient Rehab []  Home with 24 Care / Supervision   []  ECF with OT Trial  []  Anticipate No Further OT   []  HHOT Pending Family Assist  []  Will continue to assess   []  Out Patient Rehab []  Other:      P:   Continue with  current plan of care.    Total Tx Time: [address]    [place], [place] [address]  I have read the above note and agree with the documentation.  Sharin Grave [phone]

## 2011-11-20 NOTE — Discharge Summary (Signed)
Physician Discharge Summary     Patient ID:  Christina Robbins  [id number]  66 y.o.  09-15-1945    Admit date: 11/02/2011    Discharge date and time: 11/05/2011 12:26 PM     Admitting Physician: Concha Norway, MD     Discharge Physician: Concha Norway, MD    Admission Diagnoses: OSTEOARTHRITIS    Discharge Diagnoses: s/p left total Knee arthroplasty    Admission Condition: good    Discharged Condition: good    Hospital Course: The patient was admitted from the pre-operative holding area and underwent an uneventful course of left knee arthroplasty on 11/02/2011 by Concha Norway, MD. The patient was subsequently taken to the PACU and to the floor in stable condition. The patient continued antibiotics 24 hours postoperatively, as they received a dose of antibiotics preoperatively for infection prophylaxis. The patient was to begin physical therapy, WBAT, the day of surgery. The patient was also started on DVT prophylaxis. Blood counts were followed daily. On POD 1, the foley was discontinued. On post-op day 2, the dressing was changed, revealing the wound to be benign in appearance without obvious signs of infection including erythema or purulence. Social services was consulted for D/C planning as the patient made appropriate gains with PT in regaining independent function. On POD 3, the patient was discharged to [facility] in stable condition.    Treatments: s/p left total Knee arthroplasty    Disposition: The patient was provided instructions to follow up with Concha Norway, MD in 3 weeks and to call the office for an appointment. staples (s) were to be removed in 10 days. Pt was to continue DVT prophylaxis as directed. Patient was also instructed they may shower on POD 4, and to continue daily dry sterile dressing changes until wound was dry.          SignedJovita [facility]  11/20/2011  9:38 AM

## 2012-08-30 NOTE — Progress Notes (Signed)
I have attempted without success to contact this patient by phone for Preadmission Testing phone assessment.   Message left for pt to return call.

## 2012-08-31 NOTE — Patient Instructions (Addendum)
[facility] PRE-ADMISSION TESTING INSTRUCTIONS    The Preadmission Testing patient is instructed accordingly using the following criteria (check applicable):    PARKING INSTRUCTIONS:  [x]  Parking the day of Surgery is located in the [employer] lot.  Upon entering the door, make an immediate right to the surgery reception area    GENERAL INSTRUCTIONS:  [x]  Antibacterial Soap shower or bath AM of Surgery, no lotion, powders or creams to surgical site    [x]  You may brush your teeth, but do not swallow any water    [x]  Nothing by mouth after midnight, including gum, candy, mints or water     [x]  Take medications as instructed with small sip of water    [x]  Stop herbal supplements and vitamins 5 days prior to procedure    [x]  Please verify preop dosing of blood thinners with your physician    []  Do not take insulin or oral diabetic medications    []  No smoking within 24 hours of surgery     []  No alcohol or illegal drug use within 24 hours of surgery.    [x]  Jewelry, body piercing's, eyeglasses, contact lenses and dentures are not permitted into surgery (bring cases)      [x]  Please do not wear any nail polish or make up on the day of surgery     [x]  Please be sure to arrange transportation to and from the hospital    []  Please arrange for someone to be with you the remainder of the day due to having anesthesia    []  Bring urine specimen day of surgery    [x]  Bring inhalers day of surgery    []  Bring insulin day of surgery    [x]  Bring thyroid, cholesterol, and reflux medications day of surgery    [x]  Bring photo ID and insurance card, leave valuables at home    [x]  Bring in a copy of Living will or Durable Power of attorney papers.    [x]  Bring C-PAP/ Bi-Pap day of surgery    []  If diabetic and have low blood sugar or feel symptomatic, take 1-2oz apple juice    [x]  If surgeon requests a time change you will be notified the day prior to surgery    [x]  If you receive a survey after surgery we  would greatly appreciate your comments    []  If not already done, you can expect a call from registration    []  Parent/guardian of a minor must accompany their child and remain on the premises  the entire time they are under our care     []  Pediatric patients may bring favorite toy, blanket or comfort item with them    []  Follow bowel prep as instructed per surgeon    []  Shower and use CHG wipes provided as instructed    []  Other instructions

## 2012-09-01 DIAGNOSIS — N993 Prolapse of vaginal vault after hysterectomy: Secondary | ICD-10-CM | POA: Insufficient documentation

## 2012-09-01 DIAGNOSIS — IMO0002 Reserved for concepts with insufficient information to code with codable children: Secondary | ICD-10-CM | POA: Insufficient documentation

## 2012-09-01 DIAGNOSIS — N816 Rectocele: Secondary | ICD-10-CM | POA: Insufficient documentation

## 2012-09-01 NOTE — H&P (Signed)
Gynecologic History and Physical       CHIEF COMPLAINT:  Vaginal vault prolapse    HISTORY OF PRESENT ILLNESS:      Christina Robbins is a 67 y.o. female, s/p TVH, with vaginal vault prolapse.  Denies urinary leakage.  Some frequency.  Urgency.  + constipation, uses stool softener.  Urodynamics reveals TVI 500cc, TVV 500cc, PVR <25cc, MUCP 35, VLLP 63 with no leakage with reduction of prolapse.       Past Medical History:        Diagnosis Date   ??? Sleep apnea      cpap   ??? GERD (gastroesophageal reflux disease)    ??? Asthma      environmentally induced   ??? Hyperlipidemia    ??? Hypertension      120-130/70-80s averages   ??? Necrosis      autoimmune disease, oral cavity     Past Surgical History:        Procedure Laterality Date   ??? Knee arthroscopy Left    ??? Appendectomy     ??? Tonsillectomy     ??? Hysterectomy     ??? Total knee arthroplasty Left 11/02/2011     Allergies:  Adhesive tape; Codeine; and Entex  Social History:    History     Social History   ??? Marital Status: Married     Spouse Name: N/A     Number of Children: N/A   ??? Years of Education: N/A     Occupational History   ??? Not on file.     Social History Main Topics   ??? Smoking status: Former Smoker   ??? Smokeless tobacco: Never Used   ??? Alcohol Use: No   ??? Drug Use: No      Comment: smoked in college   ??? Sexually Active: Not on file     Other Topics Concern   ??? Not on file     Social History Narrative   ??? No narrative on file     Family History:   History reviewed. No pertinent family history.  Medications Prior to Admission:  Not in a hospital admission.         PHYSICAL EXAM:  Filed Vitals:    08/31/12 0855   Height: 5' (1.524 m)   Weight: 190 lb (86.183 kg)      Neuro:  Awake, alert, oriented to name, place and time.    HEENT: NC/AT, no thyromegaly  Cardiac: RRR  Lungs:  CTA b/l  Abdomen:  Soft, non tender    Pelvic: Stage 2 apical, stage 3 anterior and stage 1-2 posterior compartment prolapse.       ASSESSMENT: Vaginal vault  prolapse  Cystocele  Rectocele      PLAN:  Combined anterior and posterior colporrhaphy  Sacrospinous ligament colpopexy

## 2012-09-01 NOTE — Op Note (Signed)
Operative Note    Patient:  Christina Robbins     Procedure Date:  09/01/2012  Medical Record Number:  [id number]    Pre-op Dx:    1. Vaginal vault prolapse              2. Cystocele              3. Rectocele                 Post-op Dx:   1. Same                Procedure:    1. Combined anterior and posterior colporrhaphy              2. Sacrospinous colpopexy                          3. Tresa Endo plication suture                      Surgeon:  Wynona Canes, MD          1st Assist:  Jory Sims, MD        2nd Assist:  Marylu Lund, PA-C    Anesthesia:  General      IV Fluids:  IV crystalloid     Comps:  None        EBL:  300 cc    Drains:  Foley    Findings:  Stage 3 anterior compartment, stage 2 posterior compartment, stage 1-2 apical prolapse    Condition:  Stable    Disposition:  Tolerated procedure well     Dictation Number:  [id number]

## 2012-09-04 LAB — BASIC METABOLIC PANEL
BUN: 11 mg/dL (ref 8–23)
CO2: 28 mmol/L (ref 22–29)
Calcium: 8.9 mg/dL (ref 8.6–10.2)
Chloride: 106 mmol/L (ref 98–107)
Creatinine: 0.5 mg/dL (ref 0.5–1.0)
GFR African American: >60
GFR Non-African American: >60 mL/min/{1.73_m2} (ref >=60)
Glucose: 102 mg/dL (ref 74–109)
Potassium: 3.5 mmol/L (ref 3.5–5.0)
Sodium: 142 mmol/L (ref 132–146)

## 2012-09-04 LAB — CBC
Hematocrit: 37.4 % (ref 34.0–48.0)
Hemoglobin: 12.8 g/dL (ref 11.5–15.5)
MCH: 32.1 pg (ref 26.0–35.0)
MCHC: 34.3 % (ref 32.0–34.5)
MCV: 93.6 fL (ref 80.0–99.9)
MPV: 7.7 fL (ref 7.0–12.0)
Platelets: 221 E9/L (ref 130–450)
RBC: 3.99 E12/L (ref 3.50–5.50)
RDW: 13.9 fL (ref 11.5–15.0)
WBC: 7.7 E9/L (ref 4.5–11.5)

## 2012-09-04 MED ORDER — FENTANYL CITRATE 0.05 MG/ML IJ SOLN
0.05 | INTRAMUSCULAR | Status: DC | PRN
Start: 2012-09-04 — End: 2012-09-04

## 2012-09-04 MED ORDER — HYDROCODONE-ACETAMINOPHEN 5-325 MG PO TABS
5-325 | ORAL | Status: DC | PRN
Start: 2012-09-04 — End: 2012-09-05
  Administered 2012-09-04 – 2012-09-05 (×4): 1 via ORAL

## 2012-09-04 MED ORDER — ACETAMINOPHEN 325 MG PO TABS
325 | ORAL | Status: DC | PRN
Start: 2012-09-04 — End: 2012-09-05

## 2012-09-04 MED ORDER — MEPERIDINE HCL 50 MG/ML IJ SOLN
50 | INTRAMUSCULAR | Status: DC | PRN
Start: 2012-09-04 — End: 2012-09-05

## 2012-09-04 MED ADMIN — fentaNYL (SUBLIMAZE) injection 50 mcg: 50 ug | INTRAVENOUS | @ 16:00:00 | NDC 17478003002

## 2012-09-04 MED ADMIN — ceFAZolin (ANCEF) 2 g in dextrose 5% 100 mL IVPB: INTRAVENOUS | @ 14:00:00 | NDC 09999990046

## 2012-09-04 MED ADMIN — pantoprazole (PROTONIX) tablet 40 mg: 40 mg | ORAL | @ 18:00:00 | NDC 00093001298

## 2012-09-04 MED ADMIN — lactated ringers infusion: INTRAVENOUS | @ 13:00:00 | NDC 00338011704

## 2012-09-04 MED ADMIN — lactated ringers infusion: INTRAVENOUS | @ 21:00:00 | NDC 00338011704

## 2012-09-04 MED ADMIN — lactated ringers infusion: INTRAVENOUS | @ 22:00:00 | NDC 00338011704

## 2012-09-04 MED ADMIN — simethicone (MYLICON) chewable tablet 80 mg: 80 mg | ORAL | @ 22:00:00 | NDC 63739022510

## 2012-09-04 MED FILL — HYDROCODONE-ACETAMINOPHEN 5-325 MG PO TABS: 5-325 MG | ORAL | Qty: 1

## 2012-09-04 MED FILL — ONDANSETRON HCL 4 MG/2ML IJ SOLN: 4 MG/2ML | INTRAMUSCULAR | Qty: 2

## 2012-09-04 MED FILL — PANTOPRAZOLE SODIUM 40 MG PO TBEC: 40 MG | ORAL | Qty: 1

## 2012-09-04 MED FILL — ROCURONIUM BROMIDE 50 MG/5ML IV SOLN: 50 MG/5ML | INTRAVENOUS | Qty: 5

## 2012-09-04 MED FILL — VENTOLIN HFA 108 (90 BASE) MCG/ACT IN AERS: 108 (90 Base) MCG/ACT | RESPIRATORY_TRACT | Qty: 8

## 2012-09-04 MED FILL — SIMETHICONE 80 MG PO CHEW: 80 MG | ORAL | Qty: 1

## 2012-09-04 MED FILL — MIDAZOLAM HCL 2 MG/2ML IJ SOLN: 2 MG/ML | INTRAMUSCULAR | Qty: 2

## 2012-09-04 MED FILL — PRAVASTATIN SODIUM 20 MG PO TABS: 20 MG | ORAL | Qty: 4

## 2012-09-04 MED FILL — GLYCOPYRROLATE 0.2 MG/ML IJ SOLN: 0.2 MG/ML | INTRAMUSCULAR | Qty: 1

## 2012-09-04 MED FILL — FENTANYL CITRATE 0.05 MG/ML IJ SOLN: 0.05 MG/ML | INTRAMUSCULAR | Qty: 7

## 2012-09-04 MED FILL — DULERA 200-5 MCG/ACT IN AERO: 200-5 MCG/ACT | RESPIRATORY_TRACT | Qty: 8.8

## 2012-09-04 MED FILL — PREMARIN 0.625 MG/GM VA CREA: 0.625 MG/GM | VAGINAL | Qty: 42.5

## 2012-09-04 MED FILL — LISINOPRIL 10 MG PO TABS: 10 MG | ORAL | Qty: 1

## 2012-09-04 MED FILL — CEFAZOLIN 2000 MG IN D5W 100 ML IVPB: Qty: 2

## 2012-09-04 MED FILL — NEOSTIGMINE METHYLSULFATE 0.5 MG/ML IJ SOLN: 0.5 MG/ML | INTRAMUSCULAR | Qty: 10

## 2012-09-04 MED FILL — DEXAMETHASONE SODIUM PHOSPHATE 4 MG/ML IJ SOLN: 4 MG/ML | INTRAMUSCULAR | Qty: 1

## 2012-09-04 MED FILL — PROPOFOL 10 MG/ML IV EMUL: 10 MG/ML | INTRAVENOUS | Qty: 20

## 2012-09-04 MED FILL — DOCUSATE SODIUM 100 MG PO CAPS: 100 MG | ORAL | Qty: 1

## 2012-09-04 MED FILL — FENTANYL CITRATE 0.05 MG/ML IJ SOLN: 0.05 MG/ML | INTRAMUSCULAR | Qty: 2

## 2012-09-04 MED FILL — INDIGO CARMINE 8 MG/ML IJ SOLN: 8 MG/ML | INTRAMUSCULAR | Qty: 5

## 2012-09-04 MED FILL — LIDOCAINE HCL (CARDIAC) 20 MG/ML IV SOLN: 20 MG/ML | INTRAVENOUS | Qty: 5

## 2012-09-04 NOTE — Op Note (Signed)
Christina Robbins, [place]                                 [id number]      DATE OF PROCEDURE:  09/04/2012      SURGEON:  Wynona Canes, M.D.      FIRST ASSISTANT:  Minda Meo, M.D.      ASSISTANT:  Second assistant:  Concha Pyo, PA-C.      PREOPERATIVE DIAGNOSES:  (1) Vaginal vault prolapse.  (2) Cystocele.  (3)   Rectocele.      POSTOPERATIVE DIAGNOSES:  (1) Vaginal vault prolapse.  (2) Cystocele.   (3) Rectocele.      OPERATION:  (1) Combined anterior and posterior colporrhaphy.  (2)   Sacrospinous colpopexy.  (3) Kelly plication suture.      ANESTHESIA:  General.      ESTIMATED BLOOD LOSS:  300 mL.      COMPLICATIONS:  None.      FLUIDS:   IV crystalloid.      DRAINS:  Foley.      FINDINGS:  Stage III anterior compartment, stage II posterior compartment,   and stage I to II apical prolapse.      CONDITION:  Stable.      DESCRIPTION OF OPERATIVE PROCEDURE:  The patient was brought to the main OR   and she was then prepped and draped in the usual fashion in the dorsal   lithotomy position using Allen stirrups.      A weighted speculum was placed posteriorly.  An Allis clamp was placed on the   vaginal apex in the midline, a second Allis placed at the mid vagina and the   anterior wall and placed on traction.  A #15-blade scalpel was used to incise   a midline linear incision in the vaginal mucosa, which was then sharply   undermined using curved Metzenbaum scissors and carried to the level of the   urethrovesical junction and dissected out laterally.  A Kelly plication   suture of 3-0 Vicryl was placed.  A reefing stitch through the bladder   adventitia was used to reapproximate the defects in the anterior wall.  The   excess vaginal mucosa was trimmed and a running layer of 2-0 Vicryl was used   to reapproximate the mucosa.  Moving posteriorly, 2 Allis clamps were placed   at 5 and 7 o'clock on the vaginal fourchette, which was opened, the   rectovaginal space sharply dissected with a midline incision on the  posterior   vaginal wall to approximately 1 to 1.5 cm distal to the vaginal apex.  This   was carried out laterally.  With blunt finger retraction the perirectal space   was dissected to the level of the ischial spine.  The sacrospinous ligament   was identified, following which a suture of 0 Prolene was placed in a helical   fashion with 2 passes through the ligament in the coccygeal complex 1.5 cm   medial to the ischial spine.  The other end of the suture was then passed   helically with several passes through the posterior vaginal wall adventitia,   careful not to go through the mucosa.  This suture was then held.  The apex   of the vaginal incision was begun to be closed in a running interlocking   fashion of 2-0 Vicryl.  The sacrospinous stitch was then tied in a pulley  fashion, approximating the vaginal apex to the sacrospinous ligament complex   and tied and cut.  The vaginal mucosa was again reapproximated to the level   of the distal levator ani, which were reapproximated with 2 separate sutures   of 0 Vicryl.  A doubly gloved finger was used to _____ the rectum, which was   noted to be intact.  The sacrospinous ligament suture could be palpated   external to the rectal wall.  The rest of the mucosa was closed to the level   of the posterior fourchette.      All sponge, lap, needle, and instrument counts were correct.  The patient was   transported to recovery, having tolerated the procedure, in stable condition.               Dictated by:  Luiz Ochoa, III, M.D.            Paula Compton Janace Hoard   DD: 09/04/2012   12:07 P    DT: 09/04/2012 05:34 P   [id number]    [id number]   CC:  Wynona Canes, M.D.

## 2012-09-04 NOTE — Progress Notes (Signed)
1200 admit to phase one recovery from surgery on cart. Attached to cardiac monitor, pulse oximeter, nibp cuff left arm, alarms on. Christina Robbins  [phone] awake/alert. Vss. Pain controlled. Denies any post op nausea. Criteria met to discharge from pacu. No inpatient room available. Transferred to phase 2 recovery area until inpatient room available. Christina Robbins

## 2012-09-04 NOTE — Anesthesia Pre-Procedure Evaluation (Signed)
Christina Robbins        Department of Anesthesiology  Pre-Anesthesia Evaluation/Consultation       Name:  Christina Robbins                                         Age:  67 y.o.  MRN:  [id number]           Procedure (Scheduled): Ant/Post Colporrhaphy, poss sling, poss cysto  Surgeon:  Dr. Marchia Meiers     Allergies   Allergen Reactions   ??? Adhesive Tape      Paper tape only   ??? Codeine Hives     High doses only   ??? Entex (Ami-Tex) Swelling     tongue     Patient Active Problem List   Diagnosis   ??? Preoperative testing   ??? Osteoarthritis of left knee   ??? Hyperlipidemia LDL goal < 100   ??? Hypertension, essential, benign   ??? Vaginal vault prolapse, posthysterectomy   ??? 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 Rectocele     Past Medical History   Diagnosis Date   ??? Sleep apnea      cpap   ??? GERD (gastroesophageal reflux disease)    ??? Asthma      environmentally induced   ??? Hyperlipidemia    ??? Hypertension      120-130/70-80s averages   ??? Necrosis      autoimmune disease, oral cavity     Past Surgical History   Procedure Laterality Date   ??? Knee arthroscopy Left    ??? Appendectomy     ??? Tonsillectomy     ??? Hysterectomy     ??? 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 Smoking status: Former Smoker   ??? Smokeless tobacco: Never Used   ??? Alcohol Use: No     Medications  Current Outpatient Prescriptions on File Prior to Encounter   Medication Sig Dispense Refill   ??? ascorbic acid (VITAMIN C) 500 MG tablet Take 500 mg by mouth as needed.       ??? benzonatate (TESSALON) 100 MG capsule Take 200 mg by mouth 3 times daily as needed.       ??? levocetirizine (XYZAL) 5 MG tablet Take 5 mg by mouth nightly as needed.       ??? fluticasone (VERAMYST) 27.5 MCG/SPRAY nasal spray 2 sprays by Nasal route daily as needed.       ??? budesonide-formoterol (SYMBICORT) 160-4.5 MCG/ACT AERO Inhale 2 puffs into the lungs 2 times daily.       ??? Casanthranol-Docusate Sodium 30-100 MG CAPS Take 3 capsules by mouth every other day.       ???  pravastatin (PRAVACHOL) 80 MG tablet Take 80 mg by mouth daily.         ??? benazepril (LOTENSIN) 10 MG tablet Take 10 mg by mouth daily. Instructed to take with sip water am of procedure       ??? clobetasol (TEMOVATE) 0.05 % cream Apply  topically 2 times daily as needed. Apply topically 2 times daily.       ??? Multiple Vitamins-Calcium (VIACTIV MULTI-VITAMIN) CHEW Take  by mouth.           No current facility-administered medications on file prior to encounter.     Current Outpatient Prescriptions  Medication Sig Dispense Refill   ??? omeprazole (PRILOSEC) 20 MG capsule Take 20 mg by mouth daily.       ??? traMADol (ULTRAM) 50 MG tablet Take 50 mg by mouth every 6 hours as needed for Pain.       ??? amoxicillin (AMOXIL) 500 MG capsule Take 2,000 mg by mouth as needed. Prior dental procedures       ??? fluticasone (VERAMYST) 27.5 MCG/SPRAY nasal spray 2 sprays by Nasal route daily.       ??? albuterol (PROVENTIL HFA;VENTOLIN HFA) 108 (90 BASE) MCG/ACT inhaler Inhale 2 puffs into the lungs every 6 hours as needed for Wheezing.       ??? aspirin 81 MG tablet Take 81 mg by mouth daily.       ??? ascorbic acid (VITAMIN C) 500 MG tablet Take 500 mg by mouth as needed.       ??? benzonatate (TESSALON) 100 MG capsule Take 200 mg by mouth 3 times daily as needed.       ??? levocetirizine (XYZAL) 5 MG tablet Take 5 mg by mouth nightly as needed.       ??? fluticasone (VERAMYST) 27.5 MCG/SPRAY nasal spray 2 sprays by Nasal route daily as needed.       ??? budesonide-formoterol (SYMBICORT) 160-4.5 MCG/ACT AERO Inhale 2 puffs into the lungs 2 times daily.       ??? Casanthranol-Docusate Sodium 30-100 MG CAPS Take 3 capsules by mouth every other day.       ??? pravastatin (PRAVACHOL) 80 MG tablet Take 80 mg by mouth daily.         ??? benazepril (LOTENSIN) 10 MG tablet Take 10 mg by mouth daily. Instructed to take with sip water am of procedure       ??? clobetasol (TEMOVATE) 0.05 % cream Apply  topically 2 times daily as needed. Apply topically 2 times daily.        ??? Multiple Vitamins-Calcium (VIACTIV MULTI-VITAMIN) CHEW Take  by mouth.           Current Facility-Administered Medications   Medication Dose Route Frequency Provider Last Rate Last Dose   ??? lactated ringers infusion   Intravenous Continuous Wynona Canes, MD       ??? ceFAZolin (ANCEF) 2 g in dextrose 5% 100 mL IVPB  2 g Intravenous On Call to OR Wynona Canes, MD       ??? fentaNYL (SUBLIMAZE) injection 25 mcg  25 mcg Intravenous Q5 Min PRN Malachy Mood, MD       ??? fentaNYL (SUBLIMAZE) injection 50 mcg  50 mcg Intravenous Q5 Min PRN Malachy Mood, MD       ??? HYDROmorphone HCl PF (DILAUDID) injection 0.5 mg  0.5 mg Intravenous Q5 Min PRN Malachy Mood, MD         Vital Signs (Current) There were no vitals filed for this visit.  Vital Signs Statistics (for past 48 hrs)     No Data Recorded    BP Readings from Last 3 Encounters:   11/05/11 142/79   09/22/10 110/71     BMI  Body mass index is 37.11 kg/(m^2).  Estimated body mass index is 37.11 kg/(m^2) as calculated from the following:    Height as of this encounter: 5' (1.524 m).    Weight as of this encounter: 190 lb (86.183 kg).    CBC   Lab Results   Component Value Date    WBC 8.8 11/05/2011  RBC 3.37 11/05/2011    HGB 10.9 11/05/2011    HCT 32.4 11/05/2011    MCV 96.1 11/05/2011    RDW 13.6 11/05/2011    PLT 208 11/05/2011     CMP    Lab Results   Component Value Date    NA 140 10/26/2011    K 3.8 10/26/2011    CL 106 10/26/2011    CO2 28 10/26/2011    BUN 8 10/26/2011    CREATININE 0.4 10/26/2011    LABGLOM >60 10/26/2011    GLUCOSE 100 10/26/2011    PROT 6.7 10/26/2011    CALCIUM 9.7 10/26/2011    BILITOT 0.6 10/26/2011    ALKPHOS 69 10/26/2011    AST 17 10/26/2011    ALT 18 10/26/2011     BMP    Lab Results   Component Value Date    NA 140 10/26/2011    K 3.8 10/26/2011    CL 106 10/26/2011    CO2 28 10/26/2011    BUN 8 10/26/2011    CREATININE 0.4 10/26/2011    CALCIUM 9.7 10/26/2011    LABGLOM >60 10/26/2011    GLUCOSE 100 10/26/2011     POCGlucose  No results found for this  basename: GLUCOSE,  in the last 72 hours   Coags    No results found for this basename: PROTIME,  INR,  APTT     HCG (If Applicable)   No results found for this basename: PREGTESTUR,  PREGSERUM,  HCG,  HCGQUANT      ABGs   No results found for this basename: PHART,  PO2ART,  PCO2ART,  HCO3ART,  BEART,  O2SATART      Type & Screen (If Applicable)  No results found for this basename: LABABO,  LABRH     Radiology (If Applicable)  Cardiac Testing (If Applicable)   EKG (If Applicable)    Anesthesia Evaluation     Patient summary reviewed and Nursing notes reviewed    No history of anesthetic complications   Airway   Mallampati: II  TM distance: <3 FB  Neck ROM: full  Dental        Pulmonary     breath sounds clear to auscultation  (+) asthma, sleep apnea,   Cardiovascular   (+) hypertension well controlled,     Rhythm: regular  Rate: normal    Neuro/Psych - negative ROS   GI/Hepatic/Renal    (+) GERD well controlled,     Endo/Other    Abdominal        Other findings: Some peeling skin over gums.             Allergies: Adhesive tape; Codeine; and Entex    NPO Status:   >8h                            Anesthesia Plan    ASA 3     general     intravenous induction   Anesthetic plan and risks discussed with patient.    Plan discussed with CRNA.          Larena Glassman Christina Robbins  09/04/2012          Anesthesia Plan    ASA 3     general     intravenous induction   Anesthetic plan and risks discussed with patient.    Plan discussed with CRNA.

## 2012-09-04 NOTE — Plan of Care (Signed)
See documented care plan. Christina Robbins

## 2012-09-04 NOTE — Progress Notes (Signed)
Patient was oriented to room and new admission folder was given. Patient guide was reviewed and given explanation of Rights and Responsibilities. Christina Robbins. 09/04/2012 7:22 PM

## 2012-09-04 NOTE — Progress Notes (Signed)
Pt. Medicated for pain protonix was given also.  Pt. Resting quietly.

## 2012-09-04 NOTE — Anesthesia Post-Procedure Evaluation (Signed)
Department of Anesthesiology  Post-Anesthesia Note    Name:  Christina Robbins                                         Age:  67 y.o.  MRN:  [id number]     Last Vitals:  BP 138/64   Pulse 105   Temp(Src) 99.3 ??F (37.4 ??C) (Oral)   Resp 18   Ht 5' (1.524 m)   Wt 192 lb 4 oz (87.204 kg)   BMI 37.55 kg/m2   SpO2 97%  Patient Vitals for the past 4 hrs:   BP Temp Temp src Pulse Resp SpO2 Height Weight   09/04/12 1722 138/64 mmHg 99.3 ??F (37.4 ??C) Oral 105 18 - 5' (1.524 m) 192 lb 4 oz (87.204 kg)   09/04/12 1625 103/57 mmHg - - 107 18 97 % - -       At time of sign off by Anesthesia:  Level of Consciousness:  Stable    Respiratory:  Stable    Oxygen Saturation:  Stable    Cardiovascular:  Stable    PONV:  Stable    Post-op Pain:  Adequate analgesia    Post-op Assessment:  No apparent anesthetic complications    Additional Follow-Up / Treatment / Comment:  None    Malachy Mood, MD  September 04, 2012   6:30 PM

## 2012-09-04 NOTE — Progress Notes (Addendum)
1330 received into stage 2 in no distress-awaiting bed placement/ nourishment given/husband at bedside  `1430 resting in no distress  1530 instructed on incentive spirometry  1625 bed ready-patient updated  1650 report to floor  1713 to floor with transport in no distress  Nursing Transfer Note    Data:  Summary of patients progress: uneventful postop  Reason for transfer: transfer of care    Action:  Explained reason for transfer to Patient and Family.  Report given to: Colette Rn 6th floor, using Air traffic controller.  Mode of transportation: stretcher    Response:  RN Recommendations: see post op orders/medicate for pain-has Norco in Pacu /remove foley per orders-placed 1150 in surgery/ambulate/started Protonix daily Po

## 2012-09-05 LAB — HEMOGLOBIN AND HEMATOCRIT
Hematocrit: 32.5 % — ABNORMAL LOW (ref 34.0–48.0)
Hemoglobin: 11.1 g/dL — ABNORMAL LOW (ref 11.5–15.5)

## 2012-09-05 MED ORDER — HYDROCODONE-ACETAMINOPHEN 5-325 MG PO TABS
5-325 MG | ORAL_TABLET | ORAL | Status: AC | PRN
Start: 2012-09-05 — End: 2012-09-12

## 2012-09-05 MED ADMIN — pantoprazole (PROTONIX) tablet 40 mg: 40 mg | ORAL | @ 10:00:00 | NDC 00904623561

## 2012-09-05 MED ADMIN — docusate sodium (COLACE) capsule 100 mg: 100 mg | ORAL | NDC 63739047810

## 2012-09-05 MED ADMIN — docusate sodium (COLACE) capsule 100 mg: 100 mg | ORAL | @ 13:00:00 | NDC 63739047810

## 2012-09-05 MED ADMIN — mometasone-formoterol (DULERA) 200-5 MCG/ACT inhaler 2 puff: 2 | RESPIRATORY_TRACT | @ 01:00:00 | NDC 00085461005

## 2012-09-05 MED ADMIN — lactated ringers infusion: INTRAVENOUS | @ 06:00:00 | NDC 00338011704

## 2012-09-05 MED ADMIN — mometasone-formoterol (DULERA) 200-5 MCG/ACT inhaler 2 puff: 2 | RESPIRATORY_TRACT | @ 13:00:00 | NDC 00085461005

## 2012-09-05 MED ADMIN — simethicone (MYLICON) chewable tablet 80 mg: 80 mg | ORAL | @ 13:00:00 | NDC 63739022510

## 2012-09-05 MED ADMIN — pravastatin (PRAVACHOL) tablet 80 mg: 80 mg | ORAL | @ 13:00:00 | NDC 68084050111

## 2012-09-05 MED ADMIN — lisinopril (PRINIVIL;ZESTRIL) tablet 10 mg: 10 mg | ORAL | @ 13:00:00 | NDC 51079098201

## 2012-09-05 MED FILL — DOCUSATE SODIUM 100 MG PO CAPS: 100 MG | ORAL | Qty: 1

## 2012-09-05 MED FILL — HYDROCODONE-ACETAMINOPHEN 5-325 MG PO TABS: 5-325 MG | ORAL | Qty: 1

## 2012-09-05 MED FILL — LISINOPRIL 10 MG PO TABS: 10 MG | ORAL | Qty: 1

## 2012-09-05 MED FILL — PANTOPRAZOLE SODIUM 40 MG PO TBEC: 40 MG | ORAL | Qty: 1

## 2012-09-05 MED FILL — SIMETHICONE 80 MG PO CHEW: 80 MG | ORAL | Qty: 1

## 2012-09-05 MED FILL — PRAVASTATIN SODIUM 20 MG PO TABS: 20 MG | ORAL | Qty: 4

## 2012-09-05 NOTE — Progress Notes (Signed)
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 Feels half way decent.      Vitals:  VITALS:  BP 158/77   Pulse 100   Temp(Src) 98.5 ??F (36.9 ??C) (Oral)   Resp 18   Ht 5' (1.524 m)   Wt 192 lb 4 oz (87.204 kg)   BMI 37.55 kg/m2   SpO2 96%    24HR INTAKE/OUTPUT:    Intake/Output Summary (Last 24 hours) at 09/05/12 0717  Last data filed at 09/05/12 0659   Gross per 24 hour   Intake   3295 ml   Output   4550 ml   Net  -1255 ml     24HR PULSE OXIMETRY RANGE:  SpO2  Avg: 98.4 %  Min: 96 %  Max: 100 %  CVP:      Medications:  IV:    lactated ringers Last Rate: 125 mL/hr at 09/05/12 0210       Scheduled Meds:  ??? lisinopril  10 mg Oral Daily   ??? mometasone-formoterol  2 puff Inhalation BID   ??? pantoprazole  40 mg Oral QAM AC   ??? pravastatin  80 mg Oral Daily   ??? docusate sodium  100 mg Oral BID   ??? simethicone  80 mg Oral BID WC       Diet:   General     EXAM:  General: No distress. Alert.  Eyes: PERRL. No sclera icterus. No conjunctival injection.  ENT: No discharge. Pharynx clear.  Neck: Trachea midline. Normal thyroid.  Resp: No accessory muscle use. No crackles. No wheezing. No rhonchi.   CV: RRR with a grade 2/6 systolic murmur.  Abd: Non-tender. Non-distended. No masses. No organomegaly. Normal bowel sounds.   Skin: Warm and dry. No nodule on exposed extremities. No rash on exposed extremities.  Ext: No cyanosis, clubbing, edema  Lymph: No cervical LAD. No supraclavicular LAD.   M/S: No cyanosis. No joint deformity. No clubbing.   Neuro: Awake. Follows commands. Positive pupils/gag/corneals. Normal pain response.       Results:  CBC: Recent Labs      09/04/12   0852  09/05/12   0440   WBC  7.7   --    HGB  12.8  11.1*   HCT  37.4  32.5*   MCV  93.6   --    PLT  221   --      BMP: Recent Labs       09/04/12   0852   NA  142   K  3.5   CL  106   CO2  28   BUN  11   CREATININE  0.5     LIVER PROFILE: No results found for this basename: AST, ALT, LIPASE,  AMYLASE,  ALB, BILIDIR, BILITOT, ALKPHOS,  in the last 72 hours  PT/INR: No results found for this basename: PROTIME, INR,  in the last 72 hours  APTT: No results found for this basename: APTT,  in the last 72 hours      Today's Cultures:  1.      Today's ABG:                 1.       Radiology:    1. No results found.    Assessment:    1. Pelvic laxity  2. Hypertension benign essential controlled        Plan:  1. Home when OK with OB/GYN.    Care reviewed with the patient's family as available.        Renata Caprice,  D.O.

## 2012-09-05 NOTE — Progress Notes (Signed)
Post-Operative Note    POD#1    S:  Patient without complaints.  Normal spotting.  Ambulating.  No nausea/no vomiting.        Flatus:  Yes.  Bowel movement:  No.  Catheter removed.  Has not yet voided.     O: BP 158/77   Pulse 100   Temp(Src) 98.5 ??F (36.9 ??C) (Oral)   Resp 18   Ht 5' (1.524 m)   Wt 192 lb 4 oz (87.204 kg)   BMI 37.55 kg/m2   SpO2 96%                 ABD:     Soft.  non-distended.                  EXT:     No Homan's.         LABS:    Hemoglobin/Hematocrit:    Lab Results   Component Value Date    HGB 11.1 09/05/2012    HCT 32.5 09/05/2012       IMP:  1.  POD#1, status-post combined anterior and posterior colporrhaphy, Kelly plication and sacrospinous colpopexy           2.    Patient Active Problem List   Diagnosis   ??? Preoperative testing   ??? Osteoarthritis of left knee   ??? Hyperlipidemia LDL goal < 100   ??? Hypertension, essential, benign   ??? Vaginal vault prolapse, posthysterectomy   ??? 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 Rectocele              3.  Mild acute blood loss anemia    Plan: 1.  Routine post-op care           2.  Discharge to home after measured void           3.  Follow-up in office as directed

## 2012-09-05 NOTE — Progress Notes (Signed)
[facility] Quality Flow/Interdisciplinary Rounds Progress Note        Quality Flow Rounds held on September 05, 2012    Disciplines Attending:  Bedside Nurse, Social Worker, Case Manager and Nursing Unit Leadership    Christina Robbins was admitted on 09/04/2012  9:30 AM    Anticipated Discharge Date:  Expected Discharge Date: 09/05/12    Disposition:    Braden Score:  Braden Scale Score: 23    Readmission Score:  Readmission Assessment Score: 3      Discussed patient goal for the day, patient clinical progression, and barriers to discharge.  The following Goal(s) of the Day/Commitment(s) have been identified:  Discharge today      [name]  September 05, 2012

## 2012-09-05 NOTE — Progress Notes (Signed)
Foley removed per order.  Patient tolerated well, due to void between 1000-1200.

## 2012-09-05 NOTE — Discharge Instructions (Addendum)
Your information:  Name: Christina Robbins  DOB: April 05, 1946    What to do after you leave the hospital:    Recommended diet: General     Recommended activity: Advance as tolerated    Follow-up with Dr. Marchia Meiers as instructed.    The following personal items were collected during your admission and were returned to you:    Valuables  Dentures: None  Vision - Corrective Lenses: Glasses  Hearing Aid: None  Jewelry: None  Clothing: Pants;Shirt  Were All Patient Medications Collected?: Not Applicable  Other Valuables: Cell phone  Valuables Given To: Patient  Responsible person(s) in the waiting room?: husband bill     Information obtained by:  By signing below, I understand that if any problems occur once I leave the hospital I am to contact Dr. Marchia Meiers.  I understand and acknowledge receipt of the instructions indicated above.           Discharge Instructions  General   No heavy lifting   No driving for 1-2 weeks   No douching, intercourse or tampons until final post-op visit   May take shower or tub bath   May walk and climb stairs as tolerated    Follow-up    Expect some vaginal spotting/light bleeding   Call for appointment for office follow-up in one week   Call for fever, chills, increased bleeding, worsening pain, or any other concerns

## 2012-09-05 NOTE — Progress Notes (Signed)
Post foley catheter removal, patient voided of clear yellow urine. Dr. Marchia Meiers notified. Ok for discharge.

## 2012-09-13 LAB — MICROSCOPIC URINALYSIS

## 2012-09-13 LAB — URINALYSIS
Bilirubin Urine: NEGATIVE
Glucose, Ur: NEGATIVE mg/dL
Ketones, Urine: NEGATIVE mg/dL
Leukocyte Esterase, Urine: NEGATIVE
Nitrite, Urine: NEGATIVE
Protein, UA: NEGATIVE mg/dL
Specific Gravity, UA: 1.015 (ref 1.005–1.030)
Urobilinogen, Urine: 0.2 E.U./dL (ref <2.0)
pH, UA: 8 (ref 5.0–9.0)

## 2012-09-13 LAB — CULTURE, URINE

## 2013-01-30 LAB — COMPREHENSIVE METABOLIC PANEL
ALT: 13 U/L (ref 0–32)
AST: 14 U/L (ref 0–31)
Albumin: 3.9 g/dL (ref 3.5–5.2)
Alkaline Phosphatase: 77 U/L (ref 35–104)
BUN: 12 mg/dL (ref 8–23)
CO2: 26 mmol/L (ref 22–29)
Calcium: 9 mg/dL (ref 8.6–10.2)
Chloride: 103 mmol/L (ref 98–107)
Creatinine: 0.5 mg/dL (ref 0.5–1.0)
GFR African American: >60
GFR Non-African American: >60 mL/min/{1.73_m2} (ref >=60)
Glucose: 76 mg/dL (ref 74–109)
Potassium: 3.7 mmol/L (ref 3.5–5.0)
Sodium: 138 mmol/L (ref 132–146)
Total Bilirubin: 0.3 mg/dL (ref 0.0–1.2)
Total Protein: 6.2 g/dL — ABNORMAL LOW (ref 6.4–8.3)

## 2013-01-30 LAB — CBC WITH AUTO DIFFERENTIAL
Basophils %: 1 % (ref 0–2)
Basophils Absolute: 0.05 E9/L (ref 0.00–0.20)
Eosinophils %: 3 % (ref 0–6)
Eosinophils Absolute: 0.22 E9/L (ref 0.05–0.50)
Hematocrit: 39.1 % (ref 34.0–48.0)
Hemoglobin: 13.1 g/dL (ref 11.5–15.5)
Lymphocytes %: 22 % (ref 20–42)
Lymphocytes Absolute: 1.92 E9/L (ref 1.50–4.00)
MCH: 31 pg (ref 26.0–35.0)
MCHC: 33.6 % (ref 32.0–34.5)
MCV: 92.3 fL (ref 80.0–99.9)
MPV: 8 fL (ref 7.0–12.0)
Monocytes %: 6 % (ref 2–12)
Monocytes Absolute: 0.55 E9/L (ref 0.10–0.95)
Neutrophils %: 68 % (ref 43–80)
Neutrophils Absolute: 5.84 E9/L (ref 1.80–7.30)
Platelets: 245 E9/L (ref 130–450)
RBC: 4.23 E12/L (ref 3.50–5.50)
RDW: 13.6 fL (ref 11.5–15.0)
WBC: 8.6 E9/L (ref 4.5–11.5)

## 2013-01-30 LAB — URINALYSIS
Bilirubin Urine: NEGATIVE
Glucose, Ur: NEGATIVE mg/dL
Ketones, Urine: NEGATIVE mg/dL
Nitrite, Urine: NEGATIVE
Protein, UA: NEGATIVE mg/dL
Specific Gravity, UA: 1.01 (ref 1.005–1.030)
Urobilinogen, Urine: 0.2 E.U./dL (ref <2.0)
pH, UA: 7.5 (ref 5.0–9.0)

## 2013-01-30 LAB — MICROSCOPIC URINALYSIS

## 2013-01-30 LAB — TYPE AND SCREEN
ABO/Rh: A POS
Antibody Screen: NEGATIVE

## 2013-01-30 NOTE — Anesthesia Pre-Procedure Evaluation (Addendum)
Christina Robbins      Department of Anesthesiology  Pre-Anesthesia Evaluation/Consultation       Name:  Christina Robbins                                         Age:  67 y.o.  MRN:  [id number]           Procedure (Scheduled):  Right total joint arthroplasty knee  Surgeon:  Dr. Loman Chroman     Allergies   Allergen Reactions   ??? Adhesive Tape      Paper tape only   ??? Codeine Hives     High doses only   ??? Entex [Ami-Tex] Swelling     tongue     Patient Active Problem List   Diagnosis   ??? Preoperative testing   ??? Osteoarthritis of left knee   ??? Hyperlipidemia LDL goal < 100   ??? Hypertension, essential, benign   ??? Vaginal vault prolapse, posthysterectomy   ??? 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 Rectocele     Past Medical History   Diagnosis Date   ??? Sleep apnea      cpap   ??? GERD (gastroesophageal reflux disease)    ??? Asthma      environmentally induced   ??? Hyperlipidemia    ??? Hypertension      120-130/70-80s averages   ??? Necrosis      autoimmune disease, oral cavity     Past Surgical History   Procedure Laterality Date   ??? Knee arthroscopy Left    ??? Appendectomy     ??? Tonsillectomy     ??? Hysterectomy     ??? Total knee arthroplasty Left 11/02/2011   ??? Other surgical history  08/04/2012     anterior and posterior colporrhaphy     History   Substance Use Topics   ??? Smoking status: Former Smoker   ??? Smokeless tobacco: Never Used   ??? Alcohol Use: No     Medications  Current Outpatient Prescriptions on File Prior to Encounter   Medication Sig Dispense Refill   ??? omeprazole (PRILOSEC) 20 MG capsule Take 20 mg by mouth daily.       ??? traMADol (ULTRAM) 50 MG tablet Take 50 mg by mouth every 6 hours as needed for Pain.       ??? amoxicillin (AMOXIL) 500 MG capsule Take 2,000 mg by mouth as needed. Prior dental procedures       ??? fluticasone (VERAMYST) 27.5 MCG/SPRAY nasal spray 2 sprays by Nasal route daily.       ??? albuterol (PROVENTIL HFA;VENTOLIN HFA) 108 (90 BASE) MCG/ACT inhaler Inhale 2 puffs into the lungs every 6 hours as needed for  Wheezing.       ??? aspirin 81 MG tablet Take 81 mg by mouth daily.       ??? ascorbic acid (VITAMIN C) 500 MG tablet Take 500 mg by mouth as needed.       ??? benzonatate (TESSALON) 100 MG capsule Take 200 mg by mouth 3 times daily as needed.       ??? levocetirizine (XYZAL) 5 MG tablet Take 5 mg by mouth nightly as needed.       ??? fluticasone (VERAMYST) 27.5 MCG/SPRAY nasal spray 2 sprays by Nasal route daily as needed.       ??? budesonide-formoterol (SYMBICORT) 160-4.5 MCG/ACT AERO Inhale 2  puffs into the lungs 2 times daily.       ??? Casanthranol-Docusate Sodium 30-100 MG CAPS Take 3 capsules by mouth every other day.       ??? pravastatin (PRAVACHOL) 80 MG tablet Take 80 mg by mouth daily.         ??? benazepril (LOTENSIN) 10 MG tablet Take 10 mg by mouth daily. Instructed to take with sip water am of procedure       ??? Multiple Vitamins-Calcium (VIACTIV MULTI-VITAMIN) CHEW Take  by mouth.           No current facility-administered medications on file prior to encounter.     Current Outpatient Prescriptions   Medication Sig Dispense Refill   ??? omeprazole (PRILOSEC) 20 MG capsule Take 20 mg by mouth daily.       ??? traMADol (ULTRAM) 50 MG tablet Take 50 mg by mouth every 6 hours as needed for Pain.       ??? amoxicillin (AMOXIL) 500 MG capsule Take 2,000 mg by mouth as needed. Prior dental procedures       ??? fluticasone (VERAMYST) 27.5 MCG/SPRAY nasal spray 2 sprays by Nasal route daily.       ??? albuterol (PROVENTIL HFA;VENTOLIN HFA) 108 (90 BASE) MCG/ACT inhaler Inhale 2 puffs into the lungs every 6 hours as needed for Wheezing.       ??? aspirin 81 MG tablet Take 81 mg by mouth daily.       ??? ascorbic acid (VITAMIN C) 500 MG tablet Take 500 mg by mouth as needed.       ??? benzonatate (TESSALON) 100 MG capsule Take 200 mg by mouth 3 times daily as needed.       ??? levocetirizine (XYZAL) 5 MG tablet Take 5 mg by mouth nightly as needed.       ??? fluticasone (VERAMYST) 27.5 MCG/SPRAY nasal spray 2 sprays by Nasal route daily as needed.        ??? budesonide-formoterol (SYMBICORT) 160-4.5 MCG/ACT AERO Inhale 2 puffs into the lungs 2 times daily.       ??? Casanthranol-Docusate Sodium 30-100 MG CAPS Take 3 capsules by mouth every other day.       ??? pravastatin (PRAVACHOL) 80 MG tablet Take 80 mg by mouth daily.         ??? benazepril (LOTENSIN) 10 MG tablet Take 10 mg by mouth daily. Instructed to take with sip water am of procedure       ??? Multiple Vitamins-Calcium (VIACTIV MULTI-VITAMIN) CHEW Take  by mouth.           No current facility-administered medications for this encounter.     Vital Signs (Current) There were no vitals filed for this visit.  Vital Signs Statistics (for past 48 hrs)     BP  Min: 135/72   Min taken time: 01/30/13 0935  Max: 135/72   Max taken time: 01/30/13 0935  Temp  Avg: 98.6 ??F (37 ??C)  Min: 98.6 ??F (37 ??C)   Min taken time: 01/30/13 0935  Max: 98.6 ??F (37 ??C)   Max taken time: 01/30/13 0935  Pulse  Avg: 78  Min: 78   Min taken time: 01/30/13 0935  Max: 78   Max taken time: 01/30/13 0935  Resp  Avg: 20  Min: 20   Min taken time: 01/30/13 0935  Max: 20   Max taken time: 01/30/13 0935  SpO2  Avg: 99 %  Min: 99 %   Min taken time:  01/30/13 0935  Max: 99 %   Max taken time: 01/30/13 0935    BP Readings from Last 3 Encounters:   01/30/13 135/72   09/05/12 140/70   11/05/11 142/79     BMI  There is no weight on file to calculate BMI.  Estimated body mass index is 37.18 kg/(m^2) as calculated from the following:    Height as of 01/30/13: 5' 1.5" (1.562 m).    Weight as of 01/30/13: 200 lb (90.719 kg).    CBC   Lab Results   Component Value Date    WBC 8.6 01/30/2013    RBC 4.23 01/30/2013    HGB 13.1 01/30/2013    HCT 39.1 01/30/2013    MCV 92.3 01/30/2013    RDW 13.6 01/30/2013    PLT 245 01/30/2013     CMP    Lab Results   Component Value Date    NA 138 01/30/2013    K 3.7 01/30/2013    CL 103 01/30/2013    CO2 26 01/30/2013    BUN 12 01/30/2013    CREATININE 0.5 01/30/2013    GFRAA >60 01/30/2013    LABGLOM >60 01/30/2013     LABGLOM >60 10/26/2011    GLUCOSE 76 01/30/2013    PROT 6.2 01/30/2013    CALCIUM 9.0 01/30/2013    BILITOT 0.3 01/30/2013    ALKPHOS 77 01/30/2013    AST 14 01/30/2013    ALT 13 01/30/2013     BMP    Lab Results   Component Value Date    NA 138 01/30/2013    K 3.7 01/30/2013    CL 103 01/30/2013    CO2 26 01/30/2013    BUN 12 01/30/2013    CREATININE 0.5 01/30/2013    CALCIUM 9.0 01/30/2013    GFRAA >60 01/30/2013    LABGLOM >60 01/30/2013    LABGLOM >60 10/26/2011    GLUCOSE 76 01/30/2013     POCGlucose  Recent Labs      01/30/13   0930   GLUCOSE  76      Coags    No results found for this basename: PROTIME,  INR,  APTT     HCG (If Applicable)   No results found for this basename: PREGTESTUR,  PREGSERUM,  HCG,  HCGQUANT      ABGs   No results found for this basename: PHART,  PO2ART,  PCO2ART,  HCO3ART,  BEART,  O2SATART      Type & Screen (If Applicable)  No results found for this basename: LABABO,  LABRH     Radiology (If Applicable)  Cardiac Testing (If Applicable)   EKG (If Applicable)=NSR, mild tachycardia, IVC delay    Anesthesia Evaluation     Patient summary reviewed and Nursing notes reviewed    No history of anesthetic complications   Airway   TM distance: >3 FB  Neck ROM: full  Comment: Autoimmune disease "makes gum bleed"   Dental - normal exam       Pulmonary - normal exam   (+) asthma, sleep apnea on CPAP ,   Cardiovascular   (+) hypertension well controlled, valvular problems/murmurs MVP,     ECG reviewed  Rhythm: regular  ROS comment: 08/02/11 ECHO EF:65%  Hyperlipidemia    ROS comment: 08/02/11 ECHO EF:65%  Hyperlipidemia    Beta Blocker:  Not on Beta Blocker    Neuro/Psych    Comments: Numbness in bilateral lower extremtities  GI/Hepatic/Renal    (+) hiatal  hernia, GERD well controlled, 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  Use of blood products discussed with patient and spouse whom consented to blood products.   Plan discussed with CRNA and attending.          Kris Mouton, RN  01/30/2013

## 2013-01-30 NOTE — Patient Instructions (Signed)
Patient already diagnosed with Obstructive Sleep Apnea and using CPAP. Stop-Bang Questionnaire not necessary.

## 2013-01-30 NOTE — Patient Instructions (Signed)
[facility] PRE-ADMISSION TESTING INSTRUCTIONS    The Preadmission Testing patient is instructed accordingly using the following criteria (check applicable):    GENERAL INSTRUCTIONS:  [x]  Antibacterial Soap shower or bath AM of Surgery   [x]  Nothing by mouth after midnight   [] Hibiclens shower or bath the night before and the morning of surgery. Do not use             Hibiclens on your face or head.   [] No alcohol or illegal drug use within 24 hours of surgery.  [x] Jewelry, valuables or body piercing's should not be brought to the hospital.     [x]  Please do not wear fingernail polish or make up on the day of surgery.  [x]  Please be sure to arrange transportation to and from the hospital.  [x]  Transfusion Bracelet: Please apply to your wrist /  bring with you to the hospital on the      day of surgery.   []  Bring urine specimen day of surgery  [x]  Bring inhalers day of surgery  []  Bring insulin day of surgery  []  Bring in a copy of Living will or Durable Power of attorney papers.  [x]  C-PAP/ Bi-Pap: If you have sleep apnea and are admitted to the hospital, please bring      your C-Pap machine with you to the hospital.  []  Other Instructions:        PARKING INSTRUCTIONS:  [x]  Parking the day of Surgery is located in the I Lot. The I Lot is located on the corner of [address] and [address]. Enter the hospital through the [facility] entrance. Check in at the [facility] desk area.    [x]  Arrival Time:  0600    EDUCATION INSTRUCTIONS:  []  Knee or hip replacement booklet & exercise pamphlets given.    []  [facility] Pamphlet given    []  Pre-admission Testing educational folder given  []  Incentive Spirometry,coughing & deep breathing exercises reviewed.     []  Food and Drug interaction pamphlets given.   []  Anesthesia Pamphlets  [x] Procedure/physician specific instructions   [] Lap Cholecystectomy  [] Medication information sheet(s)  [] Breast biopsy  [] Hernia  repair   [] Fluoroscopy Educational pamphlet and Teaching    []   [] Other-  []    [] Other      MEDICATION INSTRUCTIONS:  [x]  Bring a complete medication list, make a note of last time medication taken.  [x]  Take the following medications the morning of Surgery with a sip of water:  See list       []     What to expect:  [x]  If you are admitted to the hospital post operatively, the following toiletries are are supplied: Soap, toothbrush, tooth[paste, Deoderant and lotion. Slippers are also provided.     [x]  If you are admitted to the rehabilitation unit for physical therapy postoperatively, pack a suitcase with pajama's and clothing to wear while you are there. Have your family bring it to the rehab unit facility once you are transferred postoperatively.    The above instructions were reviewed with patient/significant other.    Comments:            Nurse Ivar Bury ,R.N.   Date/time 01/30/2013 9:56 AM         PAT:  [phone]

## 2013-01-31 LAB — CULTURE, MRSA, SCREENING

## 2013-01-31 LAB — CULTURE, URINE

## 2013-02-05 NOTE — H&P (Signed)
Christina Robbins, Christina Robbins                                 [id number]      DATE:  02/06/2013      CHIEF COMPLAINT:  Right knee pain and osteoarthritis.      HISTORY OF PRESENT ILLNESS:  A 67 year old white female with bilateral knee   osteoarthritis, successful left total knee in 2013 with progressively   disabling right knee pain with osteoarthritis symptoms no longer managed with   conservative measures including injections, medications, and physical   therapy.  She is being admitted for right total knee arthroplasty.      PAST MEDICAL HISTORY:  Her past medical history includes mitral valve   prolapse, hypertension, irregular heart rhythm, hypercholesterolemia,   obesity, GERD, sleep apnea.  Family physician is Dr. Leo Grosser.      PAST SURGICAL HISTORY:  Hysterectomy at age 61, appendectomy at age 83,   bladder repair in 2014.      ALLERGIES:  Adverse medication reaction to ENTEX, caused a swollen tongue.   CODEINE caused hives.  Rash with high doses of VICODIN and VOLTAREN.      MEDICATIONS:  Benazepril HCL 10 mg daily, pravastatin 80 mg daily, omeprazole   20 mg daily, 81 mg of aspirin, multivitamin daily, vitamin D3 daily, stool   softener daily, Tessalon inhaler 200 mg as needed, Veramyst inhaler as   needed, Proventil inhaler as needed, Dulera p.r.n., tramadol 50 mg q.6 h. as   needed for pain, clobetasol propionate 0.05% daily as needed, lorazepam 2 mg   before dental procedures.      SOCIAL HISTORY:  She is married and lives with her husband.  Quit smoking   cigarettes 20 years ago.  Uses alcohol occasionally.  She is a retired   Runner, broadcasting/film/video.                                  PHYSICAL EXAMINATION      VITAL SIGNS:  Height 5 feet 1 inch, weight 190 pounds.      GENERAL APPEARANCE:  Adult alert and oriented white female.      HEENT:  Exam normocephalic.  Extraocular motion is intact.  Sclerae   anicteric.  Nares and pharynx are clear.      NECK:  Supple.      LUNGS:  Clear.      CARDIOVASCULAR:  Cardiac rate and rhythm is  regular.      ABDOMEN:  Soft and nontender.      EXTREMITIES:  Exam shows right knee valgus, range 0 to 120 degrees.  Distal   neurologic, circulatory grossly intact.      LABORATORY AND X-RAY DATA:  X-rays showed severe arthrosis, primary lateral   compartment right knee with valgus.      CLINICAL IMPRESSION:   Osteoarthritis, right knee.      PLAN:  Right total knee arthroplasty.  Procedure, recovery time, risks and   limitations reviewed.  The patient requests that we proceed.            Dictated by:  Concha Norway, M.D.         [name] / nmt/psk   DD: 02/05/2013   12:50 P    DT: 02/05/2013 06:44 P   [id number]    [id number]   CC:  Christina Robbins J. Aloni Chuang,  M.D.

## 2013-02-06 ENCOUNTER — Inpatient Hospital Stay: Admit: 2013-02-06 | Source: Home / Self Care | Attending: Orthopaedic Surgery | Admitting: Orthopaedic Surgery

## 2013-02-06 MED ORDER — HYDROMORPHONE HCL PF 2 MG/ML IJ SOLN
2 | INTRAMUSCULAR | Status: DC | PRN
Start: 2013-02-06 — End: 2013-02-06
  Administered 2013-02-06 (×2): 0.25 mg via INTRAVENOUS

## 2013-02-06 MED ORDER — LABETALOL HCL 5 MG/ML IV SOLN
5 | INTRAVENOUS | Status: DC | PRN
Start: 2013-02-06 — End: 2013-02-06

## 2013-02-06 MED ORDER — MEPERIDINE HCL 25 MG/ML IJ SOLN
25 | Freq: Once | INTRAMUSCULAR | Status: DC | PRN
Start: 2013-02-06 — End: 2013-02-06

## 2013-02-06 MED ORDER — OXYCODONE-ACETAMINOPHEN 5-325 MG PO TABS
5-325 | ORAL | Status: DC | PRN
Start: 2013-02-06 — End: 2013-02-06

## 2013-02-06 MED ORDER — MEPERIDINE HCL 50 MG/ML IJ SOLN
50 | Freq: Once | INTRAMUSCULAR | Status: DC | PRN
Start: 2013-02-06 — End: 2013-02-06

## 2013-02-06 MED ORDER — HYDRALAZINE HCL 20 MG/ML IJ SOLN
20 | INTRAMUSCULAR | Status: DC | PRN
Start: 2013-02-06 — End: 2013-02-06

## 2013-02-06 MED ORDER — ACETAMINOPHEN 325 MG PO TABS
325 | ORAL | Status: DC | PRN
Start: 2013-02-06 — End: 2013-02-09

## 2013-02-06 MED ORDER — DIPHENHYDRAMINE HCL 50 MG/ML IJ SOLN
50 | Freq: Once | INTRAMUSCULAR | Status: DC | PRN
Start: 2013-02-06 — End: 2013-02-06

## 2013-02-06 MED ORDER — PROMETHAZINE HCL 25 MG/ML IJ SOLN
25 | Freq: Once | INTRAMUSCULAR | Status: DC | PRN
Start: 2013-02-06 — End: 2013-02-06

## 2013-02-06 MED ORDER — MORPHINE SULFATE (PF) 2 MG/ML IV SOLN
2 | INTRAVENOUS | Status: DC | PRN
Start: 2013-02-06 — End: 2013-02-06

## 2013-02-06 MED ORDER — TRAMADOL HCL 50 MG PO TABS
50 | Freq: Four times a day (QID) | ORAL | Status: DC | PRN
Start: 2013-02-06 — End: 2013-02-09
  Administered 2013-02-06 – 2013-02-09 (×8): 50 mg via ORAL

## 2013-02-06 MED ORDER — MOMETASONE FURO-FORMOTEROL FUM 200-5 MCG/ACT IN AERO
200-5 MCG/ACT | Freq: Two times a day (BID) | RESPIRATORY_TRACT | Status: DC
Start: 2013-02-06 — End: 2013-02-09
  Administered 2013-02-07 – 2013-02-09 (×6): via RESPIRATORY_TRACT

## 2013-02-06 MED ADMIN — morphine (PF) injection 4 mg: INTRAMUSCULAR | @ 21:00:00 | NDC 00409189101

## 2013-02-06 MED ADMIN — ceFAZolin (ANCEF) 2 g in dextrose 5 % 100 mL IVPB: INTRAVENOUS | @ 21:00:00 | NDC 00781345170

## 2013-02-06 MED ADMIN — morphine (PF) injection 4 mg: INTRAMUSCULAR | @ 16:00:00 | NDC 00409189101

## 2013-02-06 MED ADMIN — lactated ringers infusion: INTRAVENOUS | @ 12:00:00 | NDC 00338011704

## 2013-02-06 MED ADMIN — ceFAZolin (ANCEF) 2 g in dextrose 5 % 100 mL IVPB: INTRAVENOUS | @ 13:00:00 | NDC 00781345170

## 2013-02-06 MED ADMIN — HYDROmorphone HCl PF (DILAUDID) injection 0.5 mg: 0.5 mg | INTRAVENOUS | @ 15:00:00 | NDC 00409131230

## 2013-02-06 MED FILL — LIDOCAINE HCL (CARDIAC) 20 MG/ML IV SOLN: 20 MG/ML | INTRAVENOUS | Qty: 5

## 2013-02-06 MED FILL — NORMAL SALINE FLUSH 0.9 % IV SOLN: 0.9 % | INTRAVENOUS | Qty: 10

## 2013-02-06 MED FILL — PRAVASTATIN SODIUM 20 MG PO TABS: 20 MG | ORAL | Qty: 4

## 2013-02-06 MED FILL — FLUTICASONE PROPIONATE 50 MCG/ACT NA SUSP: 50 MCG/ACT | NASAL | Qty: 16

## 2013-02-06 MED FILL — MORPHINE SULFATE (PF) 4 MG/ML IV SOLN: 4 mg/mL | INTRAVENOUS | Qty: 1

## 2013-02-06 MED FILL — VENTOLIN HFA 108 (90 BASE) MCG/ACT IN AERS: 108 (90 Base) MCG/ACT | RESPIRATORY_TRACT | Qty: 8

## 2013-02-06 MED FILL — HYDROMORPHONE HCL PF 2 MG/ML IJ SOLN: 2 MG/ML | INTRAMUSCULAR | Qty: 1

## 2013-02-06 MED FILL — DULERA 200-5 MCG/ACT IN AERO: 200-5 MCG/ACT | RESPIRATORY_TRACT | Qty: 8.8

## 2013-02-06 MED FILL — ROCURONIUM BROMIDE 50 MG/5ML IV SOLN: 50 MG/5ML | INTRAVENOUS | Qty: 5

## 2013-02-06 MED FILL — ASPIRIN ADULT LOW STRENGTH 81 MG PO TBEC: 81 MG | ORAL | Qty: 1

## 2013-02-06 MED FILL — TRAMADOL HCL 50 MG PO TABS: 50 MG | ORAL | Qty: 1

## 2013-02-06 MED FILL — BENAZEPRIL HCL 5 MG PO TABS: 5 MG | ORAL | Qty: 2

## 2013-02-06 MED FILL — CEFAZOLIN SODIUM 1 G IJ SOLR: 1 g | INTRAMUSCULAR | Qty: 2000

## 2013-02-06 MED FILL — ISOFLURANE IN SOLN: RESPIRATORY_TRACT | Qty: 25

## 2013-02-06 MED FILL — BENAZEPRIL HCL 10 MG PO TABS: 10 MG | ORAL | Qty: 1

## 2013-02-06 MED FILL — DEXAMETHASONE SODIUM PHOSPHATE 10 MG/ML IJ SOLN: 10 MG/ML | INTRAMUSCULAR | Qty: 1

## 2013-02-06 MED FILL — ONDANSETRON HCL 4 MG/2ML IJ SOLN: 4 MG/2ML | INTRAMUSCULAR | Qty: 2

## 2013-02-06 MED FILL — NEOSTIGMINE METHYLSULFATE 0.5 MG/ML IJ SOLN: 0.5 MG/ML | INTRAMUSCULAR | Qty: 10

## 2013-02-06 MED FILL — GLYCOPYRROLATE 1 MG/5ML IJ SOLN: 1 MG/5ML | INTRAMUSCULAR | Qty: 5

## 2013-02-06 NOTE — Progress Notes (Signed)
X-ray done of right knee. Christina Robbins

## 2013-02-06 NOTE — Anesthesia Post-Procedure Evaluation (Signed)
Department of Anesthesiology  Post-Anesthesia Note    Name:  Christina Robbins                                         Age:  67 y.o.  MRN:  [id number]     Last Vitals:  BP 168/79   Pulse 92   Temp(Src) 98.2 ??F (36.8 ??C) (Oral)   Resp 14   Ht 5\' 1"  (1.549 m)   Wt 200 lb (90.719 kg)   BMI 37.81 kg/m2   SpO2 99%  Patient Vitals for the past 4 hrs:   BP Temp Temp src Pulse Resp SpO2   02/06/13 1125 168/79 mmHg 98.2 ??F (36.8 ??C) Oral 92 14 99 %   02/06/13 1056 165/80 mmHg 98.1 ??F (36.7 ??C) Temporal 100 17 99 %   02/06/13 1045 165/80 mmHg - - 89 14 98 %   02/06/13 1030 164/84 mmHg - - 70 15 100 %   02/06/13 1015 156/71 mmHg - - 62 15 100 %   02/06/13 1000 138/88 mmHg - - 83 13 100 %   02/06/13 0945 - - - 84 16 100 %   02/06/13 0940 142/63 mmHg 97.8 ??F (36.6 ??C) Temporal 95 16 100 %       Level of Consciousness:  Awake    Respiratory:  Stable    Oxygen Saturation:  Stable    Cardiovascular:  Stable    Hydration:  Adequate    PONV:  Stable    Post-op Pain:  Adequate analgesia    Post-op Assessment:  No apparent anesthetic complications    Additional Follow-Up / Treatment / Comment:  None    Lenore Manner, MD  February 06, 2013   12:48 PM

## 2013-02-06 NOTE — Op Note (Signed)
Christina Robbins, Christina Robbins                                 [id number]      DATE OF PROCEDURE:  02/06/2013      SURGEON:  RAYMOND J. Loman Chroman, M.D.      ASSISTANT:  Drusilla Kanner, D.O./C. Eldred Manges, D.O.      PREOPERATIVE DIAGNOSIS:  Osteoarthritis, right knee.      POSTOPERATIVE DIAGNOSIS:  Osteoarthritis, right knee.      OPERATION:  (1) Right total knee arthroplasty.  (2) Application of   platelet-rich plasmagel to the right knee.      ANESTHESIA:  General.      ESTIMATED BLOOD LOSS:  Less than 50 mL.      COMPLICATIONS:  None.      HARDWARE USED:  Stryker Triathlon total knee system, #4 CR femur, #4 tibial   baseplate, and size 4 x 9-mm CR polyethylene insert.      SPECIMENS:  Specimens were obtained of femoral and tibial bone.      BRIEF CLINICAL INDICATION:  Christina Robbins is a 67 year old white female   well-known to the service of Dr. Loman Chroman who has a history of bilateral knee   osteoarthritis and underwent successful left total knee arthroplasty in 2013.   Since that time, she has had progressively disabling right knee pain no   longer responsive to conservative measures including steroid injections,   medications, and physical therapy.  The risks, benefits, and alternatives of   operative intervention were discussed with the patient and she wished to   proceed with right total knee arthroplasty at this time.      DESCRIPTION OF PROCEDURE:   The patient was met in the preoperative holding   area.  The correct extremity was identified by the operating surgeon and   marked.  The patient was then transferred into the operative suite.  The   correct extremity was again identified by the patient, the operating surgeon,   and circulating nurse.  The patient was then given general anesthetic via the   anesthesia team.  The patient was then transported onto the operative table   and placed in a supine position.  A time-out was then called by the   circulating nurse, once again identifying the patient, the correct operative   side,  and the procedure to be performed as well as confirmation of   administration of preoperative antibiotics.  The leg was then prepped and   draped in the usual sterile fashion.  The leg was then gravitationally   exsanguinated and a tourniquet was inflated to the proximal right thigh to   350 mmHg.      A midline skin incision was then carried out from the medial border of the   tibial tubercle to approximately 3 fingerbreadth above the superior pole of   the patella.  Incision was carried out through the subcutaneous tissue down   to the quadriceps tendon and the anterior capsule of the knee.  Small medial   and lateral soft tissue flaps were then created for exposure of the quad   tendon.  Standard medial parapatellar arthrotomy was then carried out through   the center of the quad tendon down to the medial border of the tibial   tubercle.  A towel clip was then placed to the capsule adjacent to the   patella and the patella was translated laterally  out of the operative field.   The anterior horns of the medial and lateral menisci were then sharply   excised as well as a portion of the fat pad for visualization purposes.  The   knee was then brought into flexion and the anterior cruciate ligament was   sharply excised.      The femoral drill was then used in a standard fashion for intramedullary   insertion of the femoral cutting block.  The femoral cutting block was then   inserted into the femoral canal and placed in 7 degrees of valgus which was   determined to be appropriate for the patient.  The distal femoral cutting   block was then pinned in place.  The intramedullary instrument was then   removed and a crosspin was placed through the distal femoral cutting block.   Angel wing was then used to confirm the appropriate level of the distal femur   cut.  An oscillating saw was then used to make the distal femoral cut.  The   distal femoral cutting block was then removed and a femoral sizer was placed.   The  femur was determined to be a size 4.  The femoral sizer was pinned in   place and the pins were subsequently removed.  A 4-in-1 femoral cutting guide   was then inserted and impacted into place.  Anterior cut followed by the   posterior cut and the Chamfer cuts were then performed.  The 4-in-1cutting   block was then removed.      Attention was then turned to the posterior femur and a curved osteotome was   used to remove any remaining posterior osteophytes.  The intramedullary   tibial drill was then used in a standard fashion down the medullary canal of   the tibia.  The intramedullary tibial cutting guide was then inserted.  The   stylus was placed on the lateral side of the knee given the patient's   presentation with lateral compartment DJD.  The tibial cutting block was then   checked with extramedullary guide which did line up with the tibial crest in   the 2nd metatarsal of the foot.  The tibial cutting guide was then pinned in   place.  Oscillating saw was then used to cut the tibia.  The tibial cutting   block was then removed and the proximal tibial bone was sharply excised using   a Kocher and a knife.  A #4 tibial baseplate was then determined to be the   appropriate size and was inserted onto the proximal tibia with a size 9-mm   tibial poly.  A size-4 femoral trial component was then impacted onto the   distal femur.  The trial components were then taken through a range of motion   including flexion, extension, varus and valgus forces and the knee was   determined to be appropriately balanced.      All trial components were then removed.  The knee was copiously irrigated   with normal saline through pulse lavage.  Cement was then mixed and placed   onto the final tibial baseplate as well as the final femoral prosthesis.  The   tibia was then impacted into place.  All excess cement was removed using a   Therapist, nutritional.  A size 9-mm cruciate retaining tibial polyethylene insert   was then impacted into  place onto the tibial baseplate.  A size-4 cruciate   retaining femoral component was then  impacted onto the distal femur.  All   excess cement was then removed with the Therapist, nutritional.  The knee was once   again taken through a range of motion with final implants in place and was   determined to be quite stable.  The knee was then once again copiously   irrigated with normal saline.  Platelet-rich plasmagel was then injected into   the knee joint around the capsule and around the final components.  The   capsule was closed using #1 Ethibond in an interrupted fashion.  The knee was   once again taken through a range of motion and the patella was determined to   be tracking quite nicely.  The subcutaneous tissues were once again copiously   irrigated with normal saline through pulse lavage.  Platelet-poor plasmagel   was then applied to the skin and subcutaneous tissues.      The skin and subcutaneous tissue was then closed using 0 Vicryl and 2-0   Vicryl in an interrupted fashion.  Staples were used to close the skin.  A   layered dry sterile dressing was then applied to the wound followed by Webril   cast padding and a compression Ace wrap.  The tourniquet was then deflated   with a total tourniquet time of 76 minutes.  The patient tolerated the   procedure well without any complications.  Anesthesia was then reversed via   the anesthesia staff and the patient was transferred back to the hospital bed   and transferred to the PACU in stable condition.  It should be noted that Dr.   Loman Chroman was present and scrubbed throughout the entire procedure.      POSTOPERATIVE PLAN:   1.    We will control the patient's pain with a mixture of intramuscular      morphine and Phenergan as well as oral Norco.   2.    The patient will be started on Lovenox 30 mg twice daily to be      initiated tomorrow for DVT prophylaxis.   3.    We will consult Physical Therapy for progressive gait training and      range of motion training as  well as use CPM device for early range of      motion.   4.    We will consult social work and case management for discharge planning.               Dictated by:  C. Eldred Manges, D.O.   for Concha Norway, M.D.            CS / nmt   DD: 02/06/2013   10:02 A    DT: 02/06/2013 10:38 A   [id number]    [id number]   CC:   RAYMOND J. Loman Chroman, M.D.

## 2013-02-06 NOTE — Progress Notes (Signed)
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    Recommendations:  [facility]    Restrictions  Restrictions/Precautions  Restrictions/Precautions: Weight Bearing  Lower Extremity Weight Bearing Restrictions  Right Lower Extremity Weight Bearing: Weight Bearing As Tolerated  Position Activity Restriction  Other position/activity restrictions: R knee prec., no twisting or pivoting  Vision/Hearing  Vision  Vision: Within Functional Limits  Hearing  Hearing: Within functional limits  Subjective   General  Chart Reviewed: Yes  Additional Pertinent Hx: L TKA 2013  Family / Caregiver Present: Yes (husband present)  Referring Practitioner: Dr. Marijean Bravo. Boniface  Diagnosis: OA, s/p R TKA this a.m.  Pain Screening  Patient Currently in Pain: Yes  Pain Assessment: 0-10  Pain Level: 8  Pain Type: Surgical pain, Acute pain  Pain Location: Knee  Pain Orientation: Right  Pain Descriptors: Aching, Burning, Constant, Discomfort  Pain Frequency: Continuous     Home Living  Home Living  Lives With: Spouse (husband & son, able to assist)  Type of Home: House  Home Layout: Two level, Bed/Bath upstairs, 1/2 bath on main level  Home Access: Stairs to enter with rails (3 steps in with 1 rail)  Home Equipment: Rolling walker, Museum/gallery exhibitions officerCane     Orientation  Orientation  Overall Orientation Status: Within Functional Limits  Objective      Prior Function  ADL Assistance: Independent  Ambulation Assistance: Independent  Transfer Assistance: Independent  Observation/Palpation  Posture: Good  Balance  Sitting Balance: Stand by assistance  Standing Balance: Moderate assistance  Standing Balance  Activity: func. mobility with w.w. approx. 4' from EOB to bedside chair with MOD A  Sit to stand: Maximum assistance  Stand to sit: Moderate assistance  ADL  Feeding: Independent  Grooming: Modified  independent   UE Bathing: Setup  LE Bathing: Moderate assistance;Maximum assistance  UE Dressing: Setup  LE Dressing: Maximum assistance  Transfer: Moderate assistance;Maximum assistance  Coordination  Movements Are Fluid And Coordinated: Yes  Functional Activity Tolerance  Functional Activity Tolerance: Tolerates < 10 Min exercise, no significant change in vital signs  Bed mobility  Supine to Sit: Moderate assistance  Sit to Stand: Moderate assistance;Maximum assistance  Bed to Chair: Moderate assistance  Transfers  Stand Pivot Transfers: Moderate assistance  Sit to stand: Maximum assistance  Stand to sit: Moderate assistance  Vision - Basic Assessment  Patient Visual Report: No visual complaint reported.  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  Rehab Potential: Good  Goal Formulation: Patient (to regain strength)  Requires OT Follow Up: Yes  Total Treatment Time: 30  Response to Treatment  Response to Treatment: Patient Tolerated treatment well  Safety Devices  Safety Devices in place: Yes  Type of devices: Call light within reach;Nurse  notified       Plan   Plan  Times per week: prn  Times per day: Daily  Patient Education: pt. & husband instructed RE: safe transfers, ADLS, role of OT, treatment plan, recs., prec.     Goals  Short term goals  Time Frame for Short term goals: 4-6 days  Short term goal 1: Increase UB ADLS to MOD I  Short term goal 2: Increase LB ADLS to SUPERVISION  Short term goal 3: Increase func. transfers & mobility to  SUPERVISION  Short term goal 4: Increase func. endurance to [facility] for lt./ mod. axShirl Harris  License and Documentation Cosign  Therapy License Number: [id number]

## 2013-02-06 NOTE — Discharge Instructions (Signed)
RIGHT KNEE TOTAL JOINT ARTHROPLASTY      DATE OF SURGERY: 01/06/13    SURGEON: DR. Buren Kos. BONIFACE    REMOVE STAPLES POD #10, November 14 th    DAILY DRESSING CHANGE    INCISION OPEN TO AIR WHEN DRAINAGE CEASES    MAY SHOWER    MAY USE AN ANTIBACTERIAL SOAP ON INCISION OR JUST LET WATER ROLL OFF INCISION LINE    MAY USE SHOWER CHAIR FOR SAFETY    TED HOSE FOR 4-6 WEEKS POST OP    REMOVE STOCKINGS EVERY 8 HOURS FOR 20-30 MINUTES AND CHECK SKIN INTEGRITY BILATERAL LOWER EXTREMITIES    NO CPM MACHINE    INSTRUCT PATIENT TO CONTINUE KNEE EXERCISES AS INSTRUCTED BY THE THERAPIST    PHYSICAL THERAPY EVALUATE AND TREAT    GAIT TRAINING WITH WALKER     WEIGHT BEARING STATUS:  FULL WEIGHT BEAR RIGHT LOWER EXTREMITY    ROM / STRENGTHENING/ MODALITIES    FOLLOW UP WITH DR. Buren Kos. BONIFACE AFTER DISCHARGE FROM REHAB FOR EXAMINATION AND PRESCRIPTION FOR OUTPATIENT PHYSICAL THERAPY    CALL OFFICE FOR APPOINTMENT [phone]

## 2013-02-06 NOTE — Progress Notes (Signed)
Face mask removed and patient placed on 3 liters n/c. Christina Robbins

## 2013-02-06 NOTE — Progress Notes (Signed)
Right toes are warm. Christina Robbins

## 2013-02-06 NOTE — Plan of Care (Signed)
[facility] Perioperative Care Plan Goals    Outcome: The Patient demonstrates knowledge of pain management.    Outcome Definition: Patient/ Family members communicate/ demonstrate knowledge of pain management as evidenced by an understanding f the plan for pain assessment and management, including knowledge of pharmacologic and nonpharmacologic methods of pain management and the need to report pain in a timely manner.    Activities:    [x]  Reviews records for type of pain being treated, medical condition, demographics and cultural cues.  [x] Appropriate assessment and integration of data as delineated by ASPAN Standards of Perianesthesia Nursing Practice.  [x]  Provides pain management instruction for perioperative experience, including protocols for pain medication postoperatively.  [x]  Explains patients have the right to pain management.  [x]  Evaluates response to pain management instruction.    Outcomes:  [x] Patient will verbalize understanding of pain scale and pain management.  [x] Pre-operative determination of patient???s anticipated Post-Operative pain goal:  of 10 on 10 point scale post op goal  [x] Patient will verbalize plan for pain management  [] Other :          Compliance with Pre-op Instructions - Patient reports compliance with:  [] Taking prescribed home meds before arrival  [] Surgical prep instructions specific to the patient's surgery    Education   []  Fluorsoscopy Education/ Pamphlet     Fall Risk Potential - Preoperatively  [] No preoperative risk identified  [] Preop risk identified:     []  Sensory deficit     []  Motor deficit     []  Balance problem     []  Home medication     [] Uses assistive device to ambulate  [x] Due to Perioperative medication administration    Goal(s) for fall prevention:  [x]  Prevent fall or injury by use of encouraging call light for assistance, side rails, and assisting with activity.  [x] Patient / Significant other verbalize understanding of need to call for assistance  prior to getting out of bed.      Patient Safety  [x]  Patient identification  [x]  Site verification (See Universal Protocol Checklist)  [x]  General Safety precautions  [x]  Side rails up, bed/stretcher low position and wheels locked  [x]  Call light within reach  [x]  Patient instructed to call for assistance prior to getting out of bed    Instructions - Discharge planning for Outpatients  [x]  Patient / significant other voices understanding of home care and follow up procedures.  Anticipated Special Needs upon discharge:  []  Cooling device  []  Crutches  []  Walker  []  Wound Support device   [] Drain  [x] Other       Instructions -  Discharge planning for Admitted Patients  []  Patient / Significant other understands plan for admission after surgery  []  Patient / Significant other understands plan for anticipated discharge disposition                   02/06/2013 7:26 AM Dondra Spry Nicolas Sisler

## 2013-02-06 NOTE — Brief Op Note (Signed)
Brief Postoperative Note    Christina Robbins  Date of Birth:  09-05-1945  [id number]    Pre-operative Diagnosis: R knee end stage DJD.     Post-operative Diagnosis: Same    Procedure: R TKA. Application PRP    Anesthesia: General    Surgeons/Assistants: Dr. Loman Chroman, Stalling, Marzella Miracle    Estimated Blood Loss: less than 50     Complications: None    Electronically signed by Mikki Santee, DO on 02/06/2013 at 9:42 AM

## 2013-02-06 NOTE — Brief Op Note (Signed)
Brief Postoperative Note    Christina Robbins  Date of Birth:  29-Jan-1946  [id number]    Pre-operative Diagnosis: Osteoarthritis, right knee    Post-operative Diagnosis: Same    Procedure: 1)  Right total knee arthroplasty          2)  Application of PRP right knee    Anesthesia: General    Surgeons/Assistants: Dr. Doren Custard, J. Denker, C. Lanique Gonzalo    Estimated Blood Loss: less than 50     Complications: None    Specimens: Was Obtained: Bone    Findings: As above    Electronically signed by Liana Crocker, DO on 02/06/2013 at 9:43 AM

## 2013-02-06 NOTE — Progress Notes (Signed)
Physical Therapy      Initial Assessment    Date: 02/06/2013  Patient Name: Christina Robbins  MRN: [id number]    DOB: January 08, 1946      Room #  5210  Diagnosis:  R knee OA, R TKA 02/06/13  Precautions:  WBAT R LE  Discharge Recommendation:  SAR  Equipment Needs:  w/w    Pt is alert and Oriented x 3  Pain level: R knee  Sensation:  Pt denies numbness and tingling to extremities.    Pt lives with husband in a 2 story home with 3 stairs to enter and 1 rail.  Bed is on 2 floor and bath is on 2 floor.  Pt ambulated independently with no device PTA.    Current Functional Level  Activity NT Dep Max Mod A Min A CGA SBA Super Mod I Indep Comments   Supine-Sit    x          Sit-Supine    x          Roll    x          Scoot    x          Sit-Stand   x x          Stand-Sit   x x          Stand Pivot   x x          Ambulation     x          Stairs              Activity NT Dep Max Mod A Min A CGA SBA Super Mod I Indep Comments       ROM  BUE:  WFL  BLE:  WFL, R knee; 20 to 60 deg    Strength:  BUE:  WFL  BLE:  4/5, R knee; 2+/5    Balance:  Sitting EOB:  3+/4  Dynamic Standing:  2+/4    Comments:  Pt limited by R knee pain, moderate light headedness and nausea  Pt is determined    Goals  Activity NT Dep Max Mod A Min A CGA SBA Super Mod I Indep Comments   Supine-Sit     x x        Sit-Supine     x x        Roll     x x        Scoot     x x        Sit-Stand     x x        Stand-Sit     x x        Stand Pivot     x x        Ambulation       x x    35' with w/w   Stairs     x      4 with 2 rail   Activity NT Dep Max Mod A Min A CGA SBA Super Mod I Indep Comments     Strength Goals:   Increase R knee strength by 2/3 mm. Grade  ROM: 0 to 90 deg  Balance Goals:   Increase sitting balance to 4/4   Increase standing balance to 3+/4    Pt educated on knee exercises and precautions   Pt's response to education:  Pt verbalized understanding Pt demonstrated understanding Pt unable to verbalize/ demonstrate  understanding  No further education required  in this area Pt would benefit from further education   x x   x       Pt appropriate for gait team at this time:    YES NO    x         Pt???s/ family goals   1. Improve function at rehab        Patient and or family understand(s) diagnosis, prognosis, and plan of care.  Yes, husband present    PLAN  PT care will be provided in accordance with the objectives noted above.  Whenever appropriate, clear delegation orders will be provided for nursing staff.  Exercises and functional mobility practice will be used as well as appropriate assistive devices or modalities to obtain goals. Patient and family education will also be administered as needed.    Frequency of treatments: BID daily x 3 days  Time in  1310  Time out  1340  Lovett Calender, PT  License number:  [id number]  Lovett Calender

## 2013-02-06 NOTE — Progress Notes (Signed)
Dr. Leo Grosser notified of consult for medical mang.  I spoke to [place] at the office.  Christina Robbins  02/06/2013  2:37 PM

## 2013-02-06 NOTE — Progress Notes (Signed)
Patient able to wiggle right toes. Christina Robbins

## 2013-02-06 NOTE — Progress Notes (Signed)
Dr boniface up to see patient. Triton Heidrich

## 2013-02-07 LAB — CBC WITH AUTO DIFFERENTIAL
Basophils %: 0 % (ref 0–2)
Basophils Absolute: 0.02 E9/L (ref 0.00–0.20)
Eosinophils %: 0 % (ref 0–6)
Eosinophils Absolute: 0 E9/L — ABNORMAL LOW (ref 0.05–0.50)
Hematocrit: 32.6 % — ABNORMAL LOW (ref 34.0–48.0)
Hemoglobin: 10.9 g/dL — ABNORMAL LOW (ref 11.5–15.5)
Lymphocytes %: 12 % — ABNORMAL LOW (ref 20–42)
Lymphocytes Absolute: 1.34 E9/L — ABNORMAL LOW (ref 1.50–4.00)
MCH: 30.8 pg (ref 26.0–35.0)
MCHC: 33.5 % (ref 32.0–34.5)
MCV: 92 fL (ref 80.0–99.9)
MPV: 7.7 fL (ref 7.0–12.0)
Monocytes %: 9 % (ref 2–12)
Monocytes Absolute: 0.95 E9/L (ref 0.10–0.95)
Neutrophils %: 79 % (ref 43–80)
Neutrophils Absolute: 8.71 E9/L — ABNORMAL HIGH (ref 1.80–7.30)
Platelets: 204 E9/L (ref 130–450)
RBC: 3.54 E12/L (ref 3.50–5.50)
RDW: 13.8 fL (ref 11.5–15.0)
WBC: 11 E9/L (ref 4.5–11.5)

## 2013-02-07 MED ADMIN — promethazine (PHENERGAN) injection 12.5 mg: INTRAMUSCULAR | @ 11:00:00 | NDC 00641092821

## 2013-02-07 MED ADMIN — morphine (PF) injection 4 mg: INTRAMUSCULAR | @ 11:00:00 | NDC 00409189101

## 2013-02-07 MED ADMIN — enoxaparin (LOVENOX) injection 30 mg: SUBCUTANEOUS | @ 13:00:00 | NDC 00075801301

## 2013-02-07 MED ADMIN — sodium chloride flush 0.9 % injection 10 mL: INTRAVENOUS | @ 02:00:00

## 2013-02-07 MED ADMIN — morphine (PF) injection 4 mg: INTRAMUSCULAR | @ 21:00:00 | NDC 00409189101

## 2013-02-07 MED ADMIN — aspirin EC tablet 81 mg: ORAL | @ 13:00:00 | NDC 00904770480

## 2013-02-07 MED ADMIN — morphine (PF) injection 4 mg: INTRAMUSCULAR | @ 06:00:00 | NDC 00409189101

## 2013-02-07 MED ADMIN — ceFAZolin (ANCEF) 2 g in dextrose 5 % 100 mL IVPB: INTRAVENOUS | @ 12:00:00 | NDC 00781345170

## 2013-02-07 MED ADMIN — morphine (PF) injection 4 mg: INTRAMUSCULAR | @ 16:00:00 | NDC 00409189101

## 2013-02-07 MED ADMIN — ceFAZolin (ANCEF) 2 g in dextrose 5 % 100 mL IVPB: INTRAVENOUS | @ 05:00:00 | NDC 00781345170

## 2013-02-07 MED ADMIN — promethazine (PHENERGAN) injection 12.5 mg: INTRAMUSCULAR | @ 05:00:00 | NDC 00641092821

## 2013-02-07 MED ADMIN — sodium chloride flush 0.9 % injection 10 mL: INTRAVENOUS | @ 13:00:00

## 2013-02-07 MED ADMIN — benazepril (LOTENSIN) tablet 10 mg: ORAL | @ 13:00:00 | NDC 65162075210

## 2013-02-07 MED ADMIN — docusate sodium (COLACE) capsule 100 mg: ORAL | @ 13:00:00 | NDC 63739047801

## 2013-02-07 MED ADMIN — pantoprazole (PROTONIX) tablet 40 mg: ORAL | @ 18:00:00 | NDC 00008060701

## 2013-02-07 MED ADMIN — pravastatin (PRAVACHOL) tablet 80 mg: ORAL | @ 13:00:00 | NDC 68180048609

## 2013-02-07 MED ADMIN — fluticasone (FLONASE) 50 MCG/ACT nasal spray 2 spray: NASAL | @ 13:00:00 | NDC 50383070016

## 2013-02-07 MED FILL — NORMAL SALINE FLUSH 0.9 % IV SOLN: 0.9 % | INTRAVENOUS | Qty: 20

## 2013-02-07 MED FILL — NORMAL SALINE FLUSH 0.9 % IV SOLN: 0.9 % | INTRAVENOUS | Qty: 10

## 2013-02-07 MED FILL — PROMETHAZINE HCL 25 MG/ML IJ SOLN: 25 MG/ML | INTRAMUSCULAR | Qty: 1

## 2013-02-07 MED FILL — DOCUSATE SODIUM 100 MG PO CAPS: 100 MG | ORAL | Qty: 1

## 2013-02-07 MED FILL — MORPHINE SULFATE 10 MG/ML IJ SOLN: 10 MG/ML | INTRAMUSCULAR | Qty: 1

## 2013-02-07 MED FILL — DOK PLUS 50-8.6 MG PO TABS: ORAL | Qty: 3

## 2013-02-07 MED FILL — BENAZEPRIL HCL 10 MG PO TABS: 10 MG | ORAL | Qty: 1

## 2013-02-07 MED FILL — ASPIRIN ADULT LOW STRENGTH 81 MG PO TBEC: 81 MG | ORAL | Qty: 1

## 2013-02-07 MED FILL — BENZONATATE 100 MG PO CAPS: 100 MG | ORAL | Qty: 2

## 2013-02-07 MED FILL — MIDAZOLAM HCL 2 MG/2ML IJ SOLN: 2 MG/ML | INTRAMUSCULAR | Qty: 2

## 2013-02-07 MED FILL — TRAMADOL HCL 50 MG PO TABS: 50 MG | ORAL | Qty: 1

## 2013-02-07 MED FILL — FENTANYL CITRATE 0.05 MG/ML IJ SOLN: 0.05 MG/ML | INTRAMUSCULAR | Qty: 5

## 2013-02-07 MED FILL — MORPHINE SULFATE (PF) 4 MG/ML IV SOLN: 4 mg/mL | INTRAVENOUS | Qty: 1

## 2013-02-07 MED FILL — LOVENOX 30 MG/0.3ML SC SOLN: 30 MG/0.3ML | SUBCUTANEOUS | Qty: 0.3

## 2013-02-07 MED FILL — FENTANYL CITRATE 0.05 MG/ML IJ SOLN: 0.05 MG/ML | INTRAMUSCULAR | Qty: 2

## 2013-02-07 MED FILL — DIPRIVAN 10 MG/ML IV EMUL: 10 MG/ML | INTRAVENOUS | Qty: 20

## 2013-02-07 MED FILL — PRAVASTATIN SODIUM 20 MG PO TABS: 20 MG | ORAL | Qty: 4

## 2013-02-07 MED FILL — PANTOPRAZOLE SODIUM 40 MG PO TBEC: 40 MG | ORAL | Qty: 1

## 2013-02-07 NOTE — Care Coordination-Inpatient (Signed)
02/07/2013 Social Work Discharge Planning  SW met with patient in regards to discharge plan and initial assessment. SW met with patient she states she lived independently with her husband Lona Kettle and son Marin Olp; in a 2 story home with bed and bath on second floor and 1/2 bath on first floor. There are 3 steps to enter through h the garage with rt railing going into the home. Patient has history of rehab at [facility] for Left knee surgery (Aug 2013). Patient also states she utilized OP services at [facility]) for 2 months; 2 x's a week. She states she ambulated independently prior to admission, has elevated seat, Jr size walker, cane. Per patient she has reserved bed at [facility], SW made call to Tami in regards to referral she states she is aware of patients reservation and is attempting to work out bed availability. SW faxed face sheet to facility. Will await call back regarding acceptance. Will follow. Herminio Heads

## 2013-02-07 NOTE — Progress Notes (Signed)
Department of Orthopedic Surgery  Resident Progress Note      SUBJECTIVE  Pt seen and examined. Pain controlled. No new complaints.  Up to chair yesterday afternoon for 2 hours.  No pain meds overnight, slept comfortably.    OBJECTIVE    Physical    VITALS:  BP 124/60   Pulse 90   Temp(Src) 98.6 ??F (37 ??C) (Oral)   Resp 14   Ht 5\' 1"  (1.549 m)   Wt 200 lb (90.719 kg)   BMI 37.81 kg/m2   SpO2 95%    right LE:   ?? Dressing C/D/I  ?? Distal sensory intact  ?? +2/4 PT and DP  ?? +PF/DF/EHL  ?? Compartments soft and compressible    Data    CBC:   Lab Results   Component Value Date    WBC 8.6 01/30/2013    RBC 4.23 01/30/2013    HGB 13.1 01/30/2013    HCT 39.1 01/30/2013    MCV 92.3 01/30/2013    MCH 31.0 01/30/2013    MCHC 33.6 01/30/2013    RDW 13.6 01/30/2013    PLT 245 01/30/2013    MPV 8.0 01/30/2013     PT/INR:    No results found for this basename: PROTIME, INR         ASSESSMENT   ?? POD 1 s/p Right TKA    PLAN    ?? PT/OT -- WBAT  ?? DVT ppx -- Lovenox  ?? CBC pending this AM, will follow Hgb  ?? Pain control  ?? Dressing change tomorrow  ?? Discharge planning

## 2013-02-07 NOTE — Progress Notes (Signed)
Physical Therapy  Daily Treatment Note  Date: 02/07/2013  Patient Name: Christina Robbins  MRN: [id number]     DOB: 03-Aug-1945  Recommendation for discharge: SAR  Room # 5210 A   Diagnosis: R knee OA, R TKA 02/06/13  Precautions: WBAT R LE    Pt lives with husband in a 2 story home with 3 stairs to enter and 1 rail. Bed is on 2 floor and bath is on 2 floor. Pt ambulated independently with no device PTA.     BID SESSION:    Patient cleared by nursing for treatment.  Patient is agreeable to treatment.    Pain level is: no c/o pain   Balance: fair w/ww     Functional Mobility  Bed Mobility  Rolling: NT  Supine to sit: CGA of R LE   Sit to supine: CGA of R LE   Scooting: CGA of R LE to EOB      Transfers  Sit to stand: CG/min A cues for technique   Stand to sit: CGA cues for safety   Stand pivot: CGAw/ww     Locomotion  Ambulation: 30 feet x 1 reps WBAT BLE with CGA assistance with ww device. Cues for sequencing   Pt returned to bed , ice provided for R knee. Nsg to apply CPM     Pt educated on mobility as noted   Pt's response to education:  Pt verbalized understanding Pt demonstrated understanding Pt unable to verbalize/ demonstrate  understanding  No further education required in this area Pt would benefit from further education   x X w/ cuing    x       Call light left by patient.  Pt is making good progress towards physical therapy goals.  Continue with physical therapy  Time : 1310 NA eating lunch             1350 NA bathing              1410 - 1430     License Number: Jonette Mate PTA # [phone]

## 2013-02-07 NOTE — Plan of Care (Signed)
Problem: Discharge Planning  Goal: Knowledge of discharge instructions  Outcome: Met This Shift

## 2013-02-07 NOTE — Plan of Care (Signed)
Problem: Musculor/Skeletal Functional Status  Goal: Highest potential functional level  Outcome: Ongoing

## 2013-02-07 NOTE — Progress Notes (Signed)
Physical Therapy  Daily Treatment Note  Date: 02/07/2013  Patient Name: Christina Robbins  MRN: [id number]     DOB: 22-May-1945  Recommendation for discharge: SAR  Room # 5210 A   Diagnosis: R knee OA, R TKA 02/06/13  Precautions: WBAT R LE    Pt lives with husband in a 2 story home with 3 stairs to enter and 1 rail. Bed is on 2 floor and bath is on 2 floor. Pt ambulated independently with no device PTA.    Patient cleared by nursing for treatment.  Patient is agreeable to treatment.    Pain level is: c/o pain w/ rom , nsg aware and medicating pt   Balance: sitting fair +, standing fair w/ww     Functional Mobility  Bed Mobility  Rolling: NT  Supine to sit: min A of R LE cues fr technique pt has difficulty d/t her height and lack of leverage w/ UEs   Sit to supine: NT  Scooting: min A of R LE to EOB   Pt sat EOB supervision for meds      Transfers  Sit to stand: min/mod +2 cues for technique and safety   Stand to sit: min A +2 cues for safety , eccentric control   Stand pivot: w/ww min A +2 cues for sequencing and walker safety , 5 steps EOB to B/S chair  Pt remained OOB w/ R foot supported on pillow d/t pt ht and inability to have LEs reach floor in sitting      Pt performed therapeutic exercise of the following: AP, QS, x 15 ea AROM ,  heel slides, SLR R LE  X 15 ea AAROM   Pt educated on mobility as noted   Pt's response to education:  Pt verbalized understanding Pt demonstrated understanding Pt unable to verbalize/ demonstrate  understanding  No further education required in this area Pt would benefit from further education   x X w/ cuing    x     Call light left by patient.  Pt is making good progress towards physical therapy goals.  Continue with physical therapy  Time : 750 - 540    License Number: Jonette Mate PTA # [phone]

## 2013-02-07 NOTE — Progress Notes (Signed)
Addendum to Dr. Ammie Dalton Progress Note from this am:    Pre Op   01/30/13:  Hgb.13.1  Hct.39.1  Platelet Count 245                                                                                                      Acute Blood Loss / Post Op Anemia  Post Op 02/07/13:  Hgb.10.9  Hct.32.6  Platelet Count 204    WBC's Pre Op:  8.6  Post Op :  11.0    Plan:  Check CBC 02/08/13 & 02/09/13            Pulmonary Hygiene (reason: wbc's elevated post op)    P.Marybel Alcott RN, BSN, ONC 02/07/13  9:23 am

## 2013-02-07 NOTE — Plan of Care (Signed)
Problem: Mobility - Impaired  Goal: Able to achieve maximum mobility level  Outcome: Ongoing

## 2013-02-08 LAB — CBC WITH AUTO DIFFERENTIAL
Basophils %: 1 % (ref 0–2)
Basophils Absolute: 0.06 E9/L (ref 0.00–0.20)
Eosinophils %: 1 % (ref 0–6)
Eosinophils Absolute: 0.04 E9/L — ABNORMAL LOW (ref 0.05–0.50)
Hematocrit: 29.9 % — ABNORMAL LOW (ref 34.0–48.0)
Hemoglobin: 10 g/dL — ABNORMAL LOW (ref 11.5–15.5)
Lymphocytes %: 27 % (ref 20–42)
Lymphocytes Absolute: 2.26 E9/L (ref 1.50–4.00)
MCH: 30.8 pg (ref 26.0–35.0)
MCHC: 33.5 % (ref 32.0–34.5)
MCV: 91.7 fL (ref 80.0–99.9)
MPV: 7.5 fL (ref 7.0–12.0)
Monocytes %: 10 % (ref 2–12)
Monocytes Absolute: 0.81 E9/L (ref 0.10–0.95)
Neutrophils %: 62 % (ref 43–80)
Neutrophils Absolute: 5.12 E9/L (ref 1.80–7.30)
Platelets: 180 E9/L (ref 130–450)
RBC: 3.26 E12/L — ABNORMAL LOW (ref 3.50–5.50)
RDW: 13.6 fL (ref 11.5–15.0)
WBC: 8.3 E9/L (ref 4.5–11.5)

## 2013-02-08 LAB — POCT GLUCOSE: Meter Glucose: 87 mg/dL (ref 70–110)

## 2013-02-08 LAB — CULTURE, URINE

## 2013-02-08 MED ADMIN — enoxaparin (LOVENOX) injection 30 mg: SUBCUTANEOUS | @ 02:00:00 | NDC 00075801301

## 2013-02-08 MED ADMIN — morphine (PF) injection 4 mg: INTRAMUSCULAR | @ 11:00:00 | NDC 00409189101

## 2013-02-08 MED ADMIN — docusate sodium (COLACE) capsule 100 mg: ORAL | @ 14:00:00 | NDC 63739047801

## 2013-02-08 MED ADMIN — morphine (PF) injection 4 mg: INTRAMUSCULAR | @ 21:00:00 | NDC 00409189101

## 2013-02-08 MED ADMIN — aspirin EC tablet 81 mg: ORAL | @ 14:00:00 | NDC 00904770480

## 2013-02-08 MED ADMIN — morphine (PF) injection 4 mg: INTRAMUSCULAR | @ 03:00:00 | NDC 00409189101

## 2013-02-08 MED ADMIN — pantoprazole (PROTONIX) tablet 40 mg: ORAL | @ 12:00:00 | NDC 00008060701

## 2013-02-08 MED ADMIN — sodium chloride flush 0.9 % injection 10 mL: INTRAVENOUS | @ 14:00:00

## 2013-02-08 MED ADMIN — fluticasone (FLONASE) 50 MCG/ACT nasal spray 2 spray: NASAL | @ 14:00:00 | NDC 50383070016

## 2013-02-08 MED ADMIN — morphine (PF) injection 4 mg: INTRAMUSCULAR | @ 16:00:00 | NDC 00409189101

## 2013-02-08 MED ADMIN — sodium chloride flush 0.9 % injection 10 mL: INTRAVENOUS | @ 16:00:00

## 2013-02-08 MED ADMIN — enoxaparin (LOVENOX) injection 30 mg: SUBCUTANEOUS | @ 14:00:00 | NDC 00075801301

## 2013-02-08 MED ADMIN — benazepril (LOTENSIN) tablet 10 mg: ORAL | @ 14:00:00 | NDC 65162075210

## 2013-02-08 MED ADMIN — pravastatin (PRAVACHOL) tablet 80 mg: ORAL | @ 14:00:00 | NDC 68180048609

## 2013-02-08 MED FILL — MORPHINE SULFATE (PF) 4 MG/ML IV SOLN: 4 mg/mL | INTRAVENOUS | Qty: 1

## 2013-02-08 MED FILL — BENZONATATE 100 MG PO CAPS: 100 MG | ORAL | Qty: 2

## 2013-02-08 MED FILL — TRAMADOL HCL 50 MG PO TABS: 50 MG | ORAL | Qty: 1

## 2013-02-08 MED FILL — PANTOPRAZOLE SODIUM 40 MG PO TBEC: 40 MG | ORAL | Qty: 1

## 2013-02-08 MED FILL — LOVENOX 30 MG/0.3ML SC SOLN: 30 MG/0.3ML | SUBCUTANEOUS | Qty: 0.3

## 2013-02-08 MED FILL — ASPIRIN ADULT LOW STRENGTH 81 MG PO TBEC: 81 MG | ORAL | Qty: 1

## 2013-02-08 MED FILL — PRAVASTATIN SODIUM 20 MG PO TABS: 20 MG | ORAL | Qty: 4

## 2013-02-08 MED FILL — BENAZEPRIL HCL 10 MG PO TABS: 10 MG | ORAL | Qty: 1

## 2013-02-08 NOTE — Progress Notes (Signed)
Physical Therapy  Daily Treatment Note  Date: 02/08/2013  Patient Name: Christina Robbins  MRN: [id number]     DOB: [birth year]  Recommendation for discharge: SAR  Room # 5210 A   Diagnosis: R knee OA, R TKA 02/06/13  Precautions: WBAT R LE    Pt lives with husband in a 2 story home with 3 stairs to enter and 1 rail. Bed is on 2 floor and bath is on 2 floor. Pt ambulated independently with no device PTA.     BID SESSION:    Patient cleared by nursing for treatment.  Patient is agreeable to treatment.    Pain level is: no c/o pain   Balance: fair w/ww     Functional Mobility  Bed Mobility  Rolling: NT  Supine to sit: CGA of R LE   Sit to supine: CGA of R LE   Scooting: CGA of R LE      Transfers  Sit to stand: SB/CGA cues for safety   Stand to sit: SB/CGA cues for safety   Stand pivot: CGA w/ww      Locomotion  Ambulation: 50 feet x 1 reps WBAT BLE with CGA assistance with ww device.  A pt back to bed for CPM application /nsg     Pt educated on mobility as noted   Pt's response to education:  Pt verbalized understanding Pt demonstrated understanding Pt unable to verbalize/ demonstrate  understanding  No further education required in this area Pt would benefit from further education   x X w/ cuing    x     Call light  left by patient.  Pt is making good progress towards physical therapy goals.  Continue with physical therapy  Time : 1330 NA eating lunch              1400 - 1420     License Number: Jonette Mate PTA # [phone]

## 2013-02-08 NOTE — Plan of Care (Signed)
Problem: Pain  Goal: Reduction in pain sensation  Outcome: Ongoing

## 2013-02-08 NOTE — Progress Notes (Signed)
Physical Therapy  Daily Treatment Note  Date: 02/08/2013  Patient Name: Christina Robbins  MRN: [id number]     DOB: 12/15/1945  Recommendation for discharge: SAR  Room # 5210 A   Diagnosis: R knee OA, R TKA 02/06/13  Precautions: WBAT R LE    Pt lives with husband in a 2 story home with 3 stairs to enter and 1 rail. Bed is on 2 floor and bath is on 2 floor. Pt ambulated independently with no device PTA.     Patient cleared by nursing for treatment.  Patient is agreeable to treatment.    Pain level is: no c/o pain   Balance: fair w/ww      Functional Mobility  Bed Mobility  Rolling: NT  Supine to sit: CGA of R LE   Sit to supine: NT  Scooting: CGA of R LE to EOB      Transfers  Sit to stand: CGA/min  cues for safety   Stand to sit: CGA cues for safety   Stand pivot: CGA w/ww      Locomotion  Ambulation: 15 / 25 / 10  feet x 1 / 1 /1  reps WBAT BLE with CG assistance with ww device. Cues for heel strike / toe off   Pt initially amb into B.R to void, continued gait into the hallway and while returning to the room , pt became pale and c/o nausea. Chair brought behind pt and she rested in the chair. Pt reported that she took medicine on an empty stomach, and she felt that was the cause.  Pt amb remaining distance to B/S chair .  Color improved w/ seated rest . nsg aware .  Provided pt w/ saltines and water.   Ice give to pt for R knee PRN       Pt performed therapeutic exercise of the following: AP, QS x15 ea AROM      Pt educated on mobility as noted   Pt's response to education:  Pt verbalized understanding Pt demonstrated understanding Pt unable to verbalize/ demonstrate  understanding  No further education required in this area Pt would benefit from further education   x X w/ cuing    x       Call light left by patient.  Pt is making good progress towards physical therapy goals.  Continue with physical therapy  Time : 730 - 540    License Number: Jonette Mate PTA # [id number]

## 2013-02-08 NOTE — Progress Notes (Signed)
Department of Orthopedic Surgery  Resident Progress Note      SUBJECTIVE  Pt seen and examined. Pain controlled. No new complaints.  Nursing adds no overnight events.   OBJECTIVE    Physical    VITALS:  BP 148/71   Pulse 104   Temp(Src) 99.2 ??F (37.3 ??C) (Oral)   Resp 16   Ht 5\' 1"  (1.549 m)   Wt 200 lb (90.719 kg)   BMI 37.81 kg/m2   SpO2 98%    right LE:   ?? Dressing C/D/I, changed- incision clean.   ?? Distal sensory intact  ?? +2/4 PT and DP  ?? +PF/DF/EHL  ?? Compartments soft and compressible    Data    CBC:   Lab Results   Component Value Date    WBC 11.0 02/07/2013    RBC 3.54 02/07/2013    HGB 10.9 02/07/2013    HCT 32.6 02/07/2013    MCV 92.0 02/07/2013    MCH 30.8 02/07/2013    MCHC 33.5 02/07/2013    RDW 13.8 02/07/2013    PLT 204 02/07/2013    MPV 7.7 02/07/2013     PT/INR:    No results found for this basename: PROTIME,  INR         ASSESSMENT   ?? POD 2 s/p Right TKA    PLAN    ?? PT/OT -- WBAT  ?? DVT ppx -- Lovenox  ?? Pain control  ?? Discharge planning

## 2013-02-09 LAB — CBC WITH AUTO DIFFERENTIAL
Basophils %: 1 % (ref 0–2)
Basophils Absolute: 0.06 E9/L (ref 0.00–0.20)
Eosinophils %: 2 % (ref 0–6)
Eosinophils Absolute: 0.16 E9/L (ref 0.05–0.50)
Hematocrit: 29.6 % — ABNORMAL LOW (ref 34.0–48.0)
Hemoglobin: 10 g/dL — ABNORMAL LOW (ref 11.5–15.5)
Lymphocytes %: 26 % (ref 20–42)
Lymphocytes Absolute: 2.11 E9/L (ref 1.50–4.00)
MCH: 31.2 pg (ref 26.0–35.0)
MCHC: 33.9 % (ref 32.0–34.5)
MCV: 92 fL (ref 80.0–99.9)
MPV: 7.4 fL (ref 7.0–12.0)
Monocytes %: 9 % (ref 2–12)
Monocytes Absolute: 0.77 E9/L (ref 0.10–0.95)
Neutrophils %: 62 % (ref 43–80)
Neutrophils Absolute: 5.05 E9/L (ref 1.80–7.30)
Platelets: 170 E9/L (ref 130–450)
RBC: 3.21 E12/L — ABNORMAL LOW (ref 3.50–5.50)
RDW: 12.9 fL (ref 11.5–15.0)
WBC: 8.1 E9/L (ref 4.5–11.5)

## 2013-02-09 MED ORDER — BISACODYL 10 MG RE SUPP
10 MG | Freq: Every day | RECTAL | Status: DC | PRN
Start: 2013-02-09 — End: 2014-06-12

## 2013-02-09 MED ORDER — DSS 100 MG PO CAPS
100 MG | Freq: Every day | ORAL | Status: AC
Start: 2013-02-09 — End: ?

## 2013-02-09 MED ORDER — TRAMADOL HCL 50 MG PO TABS
50 MG | ORAL_TABLET | Freq: Four times a day (QID) | ORAL | Status: DC | PRN
Start: 2013-02-09 — End: 2013-12-12

## 2013-02-09 MED ADMIN — enoxaparin (LOVENOX) injection 30 mg: SUBCUTANEOUS | @ 02:00:00 | NDC 00075801301

## 2013-02-09 MED ADMIN — fluticasone (FLONASE) 50 MCG/ACT nasal spray 2 spray: NASAL | @ 14:00:00 | NDC 50383070016

## 2013-02-09 MED ADMIN — pantoprazole (PROTONIX) tablet 40 mg: ORAL | @ 12:00:00 | NDC 00008060701

## 2013-02-09 MED ADMIN — morphine (PF) injection 4 mg: INTRAMUSCULAR | @ 18:00:00 | NDC 00409189101

## 2013-02-09 MED ADMIN — aspirin EC tablet 81 mg: ORAL | @ 13:00:00 | NDC 00904770480

## 2013-02-09 MED ADMIN — enoxaparin (LOVENOX) injection 30 mg: SUBCUTANEOUS | @ 13:00:00 | NDC 00075801301

## 2013-02-09 MED ADMIN — pravastatin (PRAVACHOL) tablet 80 mg: ORAL | @ 13:00:00 | NDC 68180048609

## 2013-02-09 MED ADMIN — sodium chloride flush 0.9 % injection 10 mL: INTRAVENOUS | @ 13:00:00

## 2013-02-09 MED ADMIN — morphine (PF) injection 4 mg: INTRAMUSCULAR | @ 07:00:00 | NDC 00409189101

## 2013-02-09 MED ADMIN — sodium chloride flush 0.9 % injection 10 mL: INTRAVENOUS | @ 02:00:00

## 2013-02-09 MED ADMIN — benazepril (LOTENSIN) tablet 10 mg: ORAL | @ 13:00:00 | NDC 65162075210

## 2013-02-09 MED ADMIN — docusate sodium (COLACE) capsule 100 mg: ORAL | @ 13:00:00 | NDC 63739047801

## 2013-02-09 MED ADMIN — morphine (PF) injection 4 mg: INTRAMUSCULAR | @ 02:00:00 | NDC 00409189101

## 2013-02-09 MED FILL — DOCUSATE SODIUM 100 MG PO CAPS: 100 MG | ORAL | Qty: 1

## 2013-02-09 MED FILL — LOVENOX 30 MG/0.3ML SC SOLN: 30 MG/0.3ML | SUBCUTANEOUS | Qty: 0.3

## 2013-02-09 MED FILL — BENZONATATE 100 MG PO CAPS: 100 MG | ORAL | Qty: 2

## 2013-02-09 MED FILL — NORMAL SALINE FLUSH 0.9 % IV SOLN: 0.9 % | INTRAVENOUS | Qty: 10

## 2013-02-09 MED FILL — PRAVASTATIN SODIUM 20 MG PO TABS: 20 MG | ORAL | Qty: 4

## 2013-02-09 MED FILL — ASPIRIN ADULT LOW STRENGTH 81 MG PO TBEC: 81 MG | ORAL | Qty: 1

## 2013-02-09 MED FILL — PANTOPRAZOLE SODIUM 40 MG PO TBEC: 40 MG | ORAL | Qty: 1

## 2013-02-09 MED FILL — MORPHINE SULFATE (PF) 4 MG/ML IV SOLN: 4 mg/mL | INTRAVENOUS | Qty: 1

## 2013-02-09 MED FILL — BENAZEPRIL HCL 10 MG PO TABS: 10 MG | ORAL | Qty: 1

## 2013-02-09 MED FILL — MORPHINE SULFATE (PF) 4 MG/ML IV SOLN: 4 MG/ML | INTRAVENOUS | Qty: 1

## 2013-02-09 MED FILL — TRAMADOL HCL 50 MG PO TABS: 50 MG | ORAL | Qty: 1

## 2013-02-09 NOTE — Progress Notes (Signed)
Physical Therapy  Daily Treatment Note  Date: 02/09/2013  Patient Name: Christina Robbins  MRN: [id number]     DOB: [birth year]  Recommendation for discharge: SAR  Room # 5210 A   Diagnosis: R knee OA, R TKA 02/06/13  Precautions: WBAT R LE    Pt lives with husband in a 2 story home with 3 stairs to enter and 1 rail. Bed is on 2 floor and bath is on 2 floor. Pt ambulated independently with no device PTA.    Patient cleared by nursing for treatment.  Patient is agreeable to treatment.    Pain level is: no c/o pain   Balance: fair w/ ww     Functional Mobility  Bed Mobility  Rolling: NT  Supine to sit: CGA of R LE   Sit to supine: NT  Scooting: CGA of R LE to EOB      Transfers  Sit to stand: SB/CGA  Stand to sit: SB/CGA   Stand pivot: SB/CGA cues for safety      Locomotion  Ambulation: 15/ 40 feet x 1 /1 reps WBAT BLE with SB/CGA assistance with ww device. Cues for heel strike /toe off R LE .  amb into B.R to move bowels   Pt returned to B.R to bath. nsg present     Pt educated on mobility as noted   Pt's response to education:  Pt verbalized understanding Pt demonstrated understanding Pt unable to verbalize/ demonstrate  understanding  No further education required in this area Pt would benefit from further education   x X w/ cuing    x     Call light left by patient.  Pt is making good progress towards physical therapy goals.  Continue with physical therapy  Time : 750 - 800              [phone]  License Number: Jonette Mate PTA # [phone]

## 2013-02-09 NOTE — Progress Notes (Signed)
Department of Orthopedic Surgery  Resident Progress Note      SUBJECTIVE  Pt seen and examined. Pain controlled. No new complaints.  Progressing well with PT.   OBJECTIVE    Physical    VITALS:  BP 150/75   Pulse 109   Temp(Src) 98.6 ??F (37 ??C) (Oral)   Resp 15   Ht 5\' 1"  (1.549 m)   Wt 200 lb (90.719 kg)   BMI 37.81 kg/m2   SpO2 96%    right LE:   ?? Dressing C/D/I  ?? Distal sensory intact  ?? +2/4 PT and DP  ?? +PF/DF/EHL  ?? Compartments soft and compressible    Data    CBC:   Lab Results   Component Value Date    WBC 8.1 02/09/2013    RBC 3.21 02/09/2013    HGB 10.0 02/09/2013    HCT 29.6 02/09/2013    MCV 92.0 02/09/2013    MCH 31.2 02/09/2013    MCHC 33.9 02/09/2013    RDW 12.9 02/09/2013    PLT 170 02/09/2013    MPV 7.4 02/09/2013     PT/INR:    No results found for this basename: PROTIME,  INR         ASSESSMENT   ?? POD 3 s/p Right TKA    PLAN    ?? PT/OT -- WBAT  ?? DVT ppx -- Lovenox  ?? Pain control  ?? Discharge planning -- to [facility] today  ?? F/U 2 weeks

## 2013-02-09 NOTE — Plan of Care (Signed)
Problem: Discharge Planning  Goal: Knowledge of discharge instructions  Outcome: Met This Shift    Problem: Mobility - Impaired  Goal: Able to achieve maximum mobility level  Outcome: Met This Shift    Problem: Infection-Surgical Site  Goal: Absence of infection signs and symptoms  Outcome: Met This Shift    Problem: Musculor/Skeletal Functional Status  Goal: Highest potential functional level  Outcome: Met This Shift  Goal: Absence of falls  Outcome: Met This Shift    Problem: Pain  Goal: Reduction in pain sensation  Outcome: Met This Shift  Goal: Control of acute pain  Outcome: Met This Shift  Goal: Control of chronic pain  Outcome: Met This Shift    Problem: Falls - Risk of  Goal: Absence of physical injury  Outcome: Met This Shift  Goal: Knowledge of fall prevention  Outcome: Met This Shift

## 2013-02-09 NOTE — Progress Notes (Signed)
Report called to [facility] with med rec faxed there

## 2013-02-09 NOTE — Progress Notes (Signed)
NURSES NOTE [facility]  POD #3 RIGHT KNEE TJA    Subjective: "I am going to [facility] today."                                                                                                                                             Objective/Assessment: Saw patient earlier this am with RJB. Patient appeared to be in no acute distress. Dressing dry. Incision line well-approximated, staples intact, no redness present. Thigh / calf soft and non tender. N/V intact.    BP 150/75   Pulse 109   Temp(Src) 98.6 ??F (37 ??C) (Oral)   Resp 15   Ht 5\' 1"  (1.549 m)   Wt 200 lb (90.719 kg)   BMI 37.81 kg/m2   SpO2 96%    Recent Labs      02/07/13   0600  02/08/13   0615  02/09/13   0550   WBC  11.0  8.3  8.1   HGB  10.9*  10.0*  10.0*   HCT  32.6*  29.9*  29.6*   PLT  204  180  170   H&H is stable.    Plan: Continue PT           DVT prophylaxis: D/C Lovenox upon discharge from the hospital / continue daily baby ASA           Discharge today - [facility] for rehab           Patient will F/U with RJB after discharge from rehab for exam and prescription for OP PT      Erin Fulling RN, BSN, ONC  02/09/2013 10:19 AM

## 2013-02-09 NOTE — Progress Notes (Signed)
IM  Letter  Signed  By  Pt  Put  In  Chart  Copy  With  pt

## 2013-02-09 NOTE — Care Coordination-Inpatient (Signed)
02/09/2013 Social Work Discharge Planning  SW received message from [place] at [facility] she states she did get precert for patient and patient is able to discharge today, 11/7. SW notified unit clerk of this matter. Patient to discharge to [facility]  At 1pm via [employer]. HENS, N17 and Folder completed and in patients soft chart. Herminio Heads

## 2013-02-09 NOTE — Plan of Care (Signed)
Problem: Mobility - Impaired  Goal: Able to achieve maximum mobility level  Outcome: Ongoing

## 2013-02-22 NOTE — Discharge Summary (Signed)
Physician Discharge Summary     Patient ID:  Christina Robbins  [id number]  67 y.o.  1945/07/20    Admit date: 02/06/2013    Discharge date and time: 02/09/2013  1:18 PM     Admitting Physician: Concha Norway, MD     Discharge Physician: Concha Norway, MD    Admission Diagnoses: OSTEOARTHRITIS    Discharge Diagnoses: s/p right total knee arthroplasty    Admission Condition: good    Discharged Condition: good    Hospital Course: The patient was admitted through the pre-operative holding area and underwent an uneventful course of right total knee arthroplasty by Concha Norway, MD on 02/06/2013. Patient was taken to the PACU and subsequently to the floor in stable condition. Physical therapy was ordered for ROM and gait training.  Antibiotics were continued 24 hours post-operatively as the patient received a dose of antibiotics pre-operatively. Blood counts were followed daily. On post-operative day 2, the dressing was changed revealing the wound to be benign in appearance without any obvious signs of infection including erythema or purulence. On post-operative day 3, the patient had made appropriate gains in pain control, and was discharged to rehab in stable condition.    Treatments: s/p right total knee arthroplasty    Disposition: Patient was given prescriptions for Norco and aspirin for DVT prophylaxis. The patient was given instructions that they may shower, but not to scrub the wound. Daily dry dressing changes. No driving. Follow up with Concha Norway, MD in 2 weeks and to call for an appointment.      SignedLiana Crocker  02/22/2013  1:26 PM

## 2013-06-22 LAB — LIPID PANEL
Cholesterol, Total: 127 mg/dL
HDL: 49 mg/dL (ref 35–70)
LDL Calculated: 63 mg/dL (ref 0–160)
Triglycerides: 73 mg/dL

## 2013-09-05 LAB — URINALYSIS
Bilirubin Urine: NEGATIVE
Glucose, Ur: NEGATIVE mg/dL
Ketones, Urine: NEGATIVE mg/dL
Leukocyte Esterase, Urine: NEGATIVE
Nitrite, Urine: NEGATIVE
Protein, UA: NEGATIVE mg/dL
Specific Gravity, UA: 1.02 (ref 1.005–1.030)
Urobilinogen, Urine: 0.2 E.U./dL (ref <2.0)
pH, UA: 6 (ref 5.0–9.0)

## 2013-09-05 LAB — MICROSCOPIC URINALYSIS

## 2013-09-06 LAB — CULTURE, URINE

## 2013-12-12 DIAGNOSIS — G4733 Obstructive sleep apnea (adult) (pediatric): Secondary | ICD-10-CM | POA: Insufficient documentation

## 2013-12-12 DIAGNOSIS — Z9989 Dependence on other enabling machines and devices: Secondary | ICD-10-CM

## 2013-12-12 MED ORDER — PNEUMOCOCCAL VAC POLYVALENT 25 MCG/0.5ML IJ INJ
25 MCG/0.5ML | Freq: Once | INTRAMUSCULAR | Status: AC
Start: 2013-12-12 — End: 2013-12-12

## 2013-12-12 NOTE — Progress Notes (Signed)
Christina Robbins   12/12/2013      INTERIM HISTORY  Christina Robbins is being seen today for the known diagnosis for Obstructive Sleep Apnea. Christina Robbins is  compliant with the use of her CPAP machine. She uses the machine for 6 hours each night. Patient does not have headaches in the morning and does not have night sweats. She does feel refreshed in the morning and does not have any daytime sleepiness. She does not snore with the machine on at night and she does not wake up gagging, choking or snorting with the machine nocturnally. The patient does not have increased urination throughout the night.      ALLERGIES  Allergies   Allergen Reactions   ??? Adhesive Tape      Paper tape only   ??? Codeine Hives     High doses only   ??? Entex [Ami-Tex] Swelling     tongue   ??? Sulfa Antibiotics Hives       SOCIAL HISTORY   She reports that she has quit smoking. Her smoking use included Cigarettes. She has a 9 pack-year smoking history. She has never used smokeless tobacco. She reports that she does not drink alcohol or use illicit drugs.    PAST MEDICAL HISTORY   She has a past medical history of Sleep apnea; GERD (gastroesophageal reflux disease); Asthma; Hyperlipidemia; Hypertension; Necrosis; Hiatal hernia; and Osteoarthritis.     MEDICATIONS      She has a current medication list which includes the following prescription(s): alendronate, bisacodyl, docusate, omeprazole, amoxicillin, albuterol, aspirin, ascorbic acid, benzonatate, fluticasone, pravastatin, benazepril, and viactiv multi-vitamin.    IMMUNIZATIONS  To get pneumovax today.    REVIEW OF SYSTEMS  The patient has no problems with hearing. There is no problem with seeing, smelling, or tasting. There is no history of seizure, syncope, or stroke. There is no history of hemoptysis, hematemesis, hematochezia, hematuria, or dysuria. Other than as noted above, the review of systems is unremarkable.     OBJECTIVE: On physical examination, the patient is awake and alert and in no  acute distress. BP 130/80 mmHg   Pulse 88   Temp(Src) 97.7 ??F (36.5 ??C) (Tympanic)   Resp 20   Ht 5' (1.524 m)   Wt 205 lb (92.987 kg)   BMI 40.04 kg/m2   SpO2 98% Body mass index is 40.04 kg/(m^2). Nose and throat examination shows no erythema or exudates. Neck is supple without JVD, adenopathy or thyromegaly. Chest has symmetrical excursion. Breath sounds are equal. Lungs are clear to auscultation and percussion. Cardiovascular exam reveals the heart rhythm to be regular. No murmur or gallops are present. Abdomen has no masses or organomegaly. Extremities show no signs of clubbing, cyanosis or edema. Skin is warm and dry and without rash. GAIT is normal.    Epworth Sleepiness Scale score is 3    DIAGNOSIS  1. OSA on CPAP    2. Non morbid obesity due to excess calories    3. Hypertension, essential, benign        DISCUSSION Christina Robbins seems to be doing well.  We did have a discussion about some of the new issues in sleep medicine that have come up over the past year.  We also discussed weight loss today. All questions were answered.    PLAN/IMPRESSION  Sleep clinic in 6 months  No changes at this time.  Consider mini CPAP  Lose weight           Christina Robbins.  Christina Robbins, M.D.  Diplomat in Sleep Medicine, [facility]

## 2013-12-12 NOTE — Patient Instructions (Signed)
Sleep Apnea: Care Instructions  Your Care Instructions  Sleep apnea means that you frequently stop breathing for 10 seconds or longer during sleep. It can be mild to severe, based on the number of times an hour that you stop breathing or have slowed breathing.  Blocked or narrowed airways in your nose, mouth, or throat can cause sleep apnea. Your airway can become blocked when your throat muscles and tongue relax during sleep.  You can treat sleep apnea at home by making lifestyle changes. You also can use a CPAP breathing machine that keeps tissues in the throat from blocking your airway. Or your doctor may suggest that you use a breathing device while you sleep. It helps keep your airway open. This could be a device that you put in your mouth. Other examples include strips or disks that you use on your nose. In some cases, surgery may be needed to remove enlarged tissues in the throat.  Follow-up care is a key part of your treatment and safety. Be sure to make and go to all appointments, and call your doctor if you are having problems. It's also a good idea to know your test results and keep a list of the medicines you take.  How can you care for yourself at home?  ?? Lose weight, if needed. It may reduce the number of times you stop breathing or have slowed breathing.  ?? Sleep on your side. It may stop mild apnea. If you tend to roll onto your back, sew a pocket in the back of your pajama top. Put a tennis ball into the pocket, and stitch the pocket shut. This will help keep you from sleeping on your back.  ?? Avoid alcohol and medicines such as sleeping pills and sedatives before bed.  ?? Do not smoke. Smoking can make sleep apnea worse. If you need help quitting, talk to your doctor about stop-smoking programs and medicines. These can increase your chances of quitting for good.  ?? Prop up the head of your bed 4 to 6 inches by putting bricks under the legs of the bed.  ?? Treat breathing problems, such as a stuffy  nose, caused by a cold or allergies.  ?? Try a continuous positive airway pressure (CPAP) breathing machine if your doctor recommends it. The machine keeps your airway open when you sleep.  ?? If CPAP does not work for you, ask your doctor if you can try other breathing machines. A bilevel positive airway pressure machine uses one type of air pressure for breathing in and another type for breathing out. Another device raises or lowers air pressure as needed while you breathe.  ?? Talk to your doctor if:  ?? Your nose feels dry or bleeds when you use one of these machines. You may need to increase moisture in the air. A humidifier may help.  ?? Your nose is runny or stuffy from using a breathing machine. Decongestants or a corticosteroid nasal spray may help.  ?? You are sleepy during the day and it gets in the way of the normal things you do. Do not drive when you are drowsy.  When should you call for help?  Watch closely for changes in your health, and be sure to contact your doctor if:  ?? You still have sleep apnea even though you have made lifestyle changes.  ?? You are thinking of trying a device such as CPAP.  ?? You are having problems using a CPAP or similar machine.     Where can you learn more?   Go to [email or web address] and sign in to your MyChart account. Enter J936 in the Search Health Information box to learn more about ???Sleep Apnea: Care Instructions.???    If you do not have an account, please click on the ???Sign Up Now??? link.     ?? 2006-2015 Healthwise, Incorporated. Care instructions adapted under license by [facility]. This care instruction is for use with your licensed healthcare professional. If you have questions about a medical condition or this instruction, always ask your healthcare professional. Healthwise, Incorporated disclaims any warranty or liability for your use of this information.  Content Version: 10.6.465758; Current as of: December 12, 2012

## 2013-12-25 ENCOUNTER — Telehealth

## 2013-12-25 MED ORDER — CPAP MACHINE
Status: AC
Start: 2013-12-25 — End: ?

## 2013-12-25 NOTE — Telephone Encounter (Signed)
Script sent to [facility] as requested.

## 2013-12-25 NOTE — Telephone Encounter (Signed)
Ms. Christina Robbins [facility] cpap [facility].  On 12/13/13 her 5 years was up for insurance.  She's been using a loaner one since January.  Any questions please call [phone]

## 2014-06-12 ENCOUNTER — Ambulatory Visit: Admit: 2014-06-12 | Discharge: 2014-06-12 | Payer: MEDICARE | Attending: Adult Health | Primary: Family Medicine

## 2014-06-12 DIAGNOSIS — G4733 Obstructive sleep apnea (adult) (pediatric): Secondary | ICD-10-CM

## 2014-06-12 NOTE — Progress Notes (Signed)
Patient being seen in the office for Obstructive Sleep Apnea.    Chief Complaint  Chief Complaint   Patient presents with   ??? 6 Month Follow-Up       History of present illness:    Patient continues to remain complaint with the use of  CPAP mask at 6 cm of water pressure  for 6-7 hours each night. Patient denies headaches in the morning or problems with night sweats. Patient feels refreshed in the morning and denies excessive daytime sleepiness. Patient does not not snore with the CPAP mask on each night and the patient denies waking up gagging, choking or snorting with the CPAP  mask nocturnally. Patient denies increased urination throughout the night.     Sleep Medicine 06/12/2014   Sitting and reading 1   Watching TV 1   Sitting, inactive in a public place (e.g. a theatre or a meeting) 0   As a passenger in a car for an hour without a break 1   Lying down to rest in the afternoon when circumstances permit 1   Sitting and talking to someone 0   Sitting quietly after a lunch without alcohol 0   In a car, while stopped for a few minutes in traffic 0   Total score 4   Neck circumference 13.5       Allergies  Allergies   Allergen Reactions   ??? Adhesive Tape      Paper tape only   ??? Codeine Hives     High doses only   ??? Entex [Ami-Tex] Swelling     tongue   ??? Sulfa Antibiotics Hives       Medications and labs have all been reviewed.  Current Outpatient Prescriptions   Medication Sig Dispense Refill   ??? dapsone 100 MG tablet Take 100 mg by mouth daily     ??? CPAP Machine MISC Please provide patient with new CPAP machine and supplies as needed.  Diagnosis-OSA  [facility]ed to [facility] each 0   ??? docusate sodium (COLACE, DULCOLAX) 100 MG CAPS Take 100 mg by mouth daily.     ??? omeprazole (PRILOSEC) 20 MG capsule Take 20 mg by mouth daily.     ??? amoxicillin (AMOXIL) 500 MG capsule Take 2,000 mg by mouth as needed. Prior dental procedures     ??? albuterol (PROVENTIL HFA;VENTOLIN HFA) 108 (90 BASE) MCG/ACT inhaler Inhale 2 puffs into the  lungs every 6 hours as needed for Wheezing.     ??? aspirin 81 MG tablet Take 81 mg by mouth daily.     ??? ascorbic acid (VITAMIN C) 500 MG tablet Take 500 mg by mouth as needed.     ??? benzonatate (TESSALON) 100 MG capsule Take 200 mg by mouth 3 times daily as needed.     ??? fluticasone (VERAMYST) 27.5 MCG/SPRAY nasal spray 2 sprays by Nasal route daily as needed.     ??? pravastatin (PRAVACHOL) 80 MG tablet Take 80 mg by mouth daily.       ??? benazepril (LOTENSIN) 10 MG tablet Take 10 mg by mouth daily. Instructed to take with sip water am of procedure     ??? Multiple Vitamins-Calcium (VIACTIV MULTI-VITAMIN) CHEW Take 1 tablet by mouth daily        No current facility-administered medications for this visit.       Objective:  BP 110/70 mmHg   Pulse 95   Temp(Src) 96.6 ??F (35.9 ??C)   Resp 16   Ht  5' (1.524 m)   Wt 209 lb 6.4 oz (94.983 kg)   BMI 40.90 kg/m2   SpO2 94%    SpO2 Readings from Last 1 Encounters:   06/12/14 94%            Epsworth Sleepiness Scale Score of 4/24 which suggests the patient does not suffer from excessive daytime sleepiness/hypersomnolence.      PHYSICAL EXAM:  General Appearance:   The patient is a well developed and nourished female who   is awake, alert and in no acute distress.    Head:    Normocephalic, without obvious abnormality, atraumatic   Eyes:    PERRL, conjunctiva/corneas clear, both eyes.       Nose:  Nares normal, mucosa normal   Throat:  Lips, mucosa, and tongue normal. Mallampati class I-II.   Neck:  Supple, symmetrical, trachea midline, no adenopathy;     thyroid:  no enlargement/tenderness/nodules or JVD.         Lungs:   Resonant. Breath sounds vesicular, CTA. Respirations   unlabored. Symmetrical expansion. Normal excursion.        Heart:   Regular rate and rhythm, S1 and S2 normal, no murmur, rub   or gallop.         Abdomen:    Soft, round, non-tender, bowel sounds active all four quadrants,     no masses, no organomegaly, no bruit.           Extremities:  Extremities normal,  no cyanosis, clubbing, or edema.       Skin: Warm and dry. Skin color normal.  No rashes,    bleeding,  or lesions.            ASSESSMENT:  1. OSA on CPAP     2. Obesity with sleep apnea     3. Hypertension, essential, benign          PLAN:  Ms. Christina Robbins continues to use and benefit from the CPAP mask at 6 cm of water pressure nocturnally.   Patient was encouraged to increase sleep time with her machine to 7-8 hours each night  Patient was encouraged with weight loss efforts     Patient will return to the sleep clinic every 6 months and will follow with Dr. Audelia Actonropp every 2 years.   Patient was encouraged to call the office at any time for questions or problems with  CPAP mask or machine.        This plan of care was reviewed in collaboration with Dr. Clovis Caoropp     Christina Robbins , RN, MSN, NP-C  06/12/2014  2:23 PM

## 2014-06-12 NOTE — Patient Instructions (Signed)
Sleep Apnea: Care Instructions  Your Care Instructions  Sleep apnea means that you frequently stop breathing for 10 seconds or longer during sleep. It can be mild to severe, based on the number of times an hour that you stop breathing or have slowed breathing.  Blocked or narrowed airways in your nose, mouth, or throat can cause sleep apnea. Your airway can become blocked when your throat muscles and tongue relax during sleep.  You can treat sleep apnea at home by making lifestyle changes. You also can use a CPAP breathing machine that keeps tissues in the throat from blocking your airway. Or your doctor may suggest that you use a breathing device while you sleep. It helps keep your airway open. This could be a device that you put in your mouth. Other examples include strips or disks that you use on your nose. In some cases, surgery may be needed to remove enlarged tissues in the throat.  Follow-up care is a key part of your treatment and safety. Be sure to make and go to all appointments, and call your doctor if you are having problems. It's also a good idea to know your test results and keep a list of the medicines you take.  How can you care for yourself at home?  ?? Lose weight, if needed. It may reduce the number of times you stop breathing or have slowed breathing.  ?? Sleep on your side. It may stop mild apnea. If you tend to roll onto your back, sew a pocket in the back of your pajama top. Put a tennis ball into the pocket, and stitch the pocket shut. This will help keep you from sleeping on your back.  ?? Avoid alcohol and medicines such as sleeping pills and sedatives before bed.  ?? Do not smoke. Smoking can make sleep apnea worse. If you need help quitting, talk to your doctor about stop-smoking programs and medicines. These can increase your chances of quitting for good.  ?? Prop up the head of your bed 4 to 6 inches by putting bricks under the legs of the bed.  ?? Treat breathing problems, such as a stuffy  nose, caused by a cold or allergies.  ?? Try a continuous positive airway pressure (CPAP) breathing machine if your doctor recommends it. The machine keeps your airway open when you sleep.  ?? If CPAP does not work for you, ask your doctor if you can try other breathing machines. A bilevel positive airway pressure machine uses one type of air pressure for breathing in and another type for breathing out. Another device raises or lowers air pressure as needed while you breathe.  ?? Talk to your doctor if:  ?? Your nose feels dry or bleeds when you use one of these machines. You may need to increase moisture in the air. A humidifier may help.  ?? Your nose is runny or stuffy from using a breathing machine. Decongestants or a corticosteroid nasal spray may help.  ?? You are sleepy during the day and it gets in the way of the normal things you do. Do not drive when you are drowsy.  When should you call for help?  Watch closely for changes in your health, and be sure to contact your doctor if:  ?? You still have sleep apnea even though you have made lifestyle changes.  ?? You are thinking of trying a device such as CPAP.  ?? You are having problems using a CPAP or similar machine.     Where can you learn more?   Go to [email or web address] and sign in to your MyChart account. Enter J936 in the Search Health Information box to learn more about ???Sleep Apnea: Care Instructions.???    If you do not have an account, please click on the ???Sign Up Now??? link.     ?? 2006-2015 Healthwise, Incorporated. Care instructions adapted under license by [facility]. This care instruction is for use with your licensed healthcare professional. If you have questions about a medical condition or this instruction, always ask your healthcare professional. Healthwise, Incorporated disclaims any warranty or liability for your use of this information.  Content Version: 10.6.465758; Current as of: December 12, 2012

## 2014-11-24 MED ORDER — OMEPRAZOLE 20 MG PO CPDR
20 MG | ORAL_CAPSULE | ORAL | 0 refills | Status: DC
Start: 2014-11-24 — End: 2015-03-28

## 2014-11-24 MED ORDER — PRAVASTATIN SODIUM 80 MG PO TABS
80 MG | ORAL_TABLET | ORAL | 0 refills | Status: DC
Start: 2014-11-24 — End: 2015-03-28

## 2014-11-24 MED ORDER — BENAZEPRIL HCL 10 MG PO TABS
10 MG | ORAL_TABLET | ORAL | 0 refills | Status: DC
Start: 2014-11-24 — End: 2015-03-28

## 2014-12-18 ENCOUNTER — Ambulatory Visit: Admit: 2014-12-18 | Discharge: 2014-12-18 | Payer: MEDICARE | Attending: Adult Health | Primary: Family Medicine

## 2014-12-18 DIAGNOSIS — Z9989 Dependence on other enabling machines and devices: Secondary | ICD-10-CM

## 2014-12-18 NOTE — Progress Notes (Signed)
[facility]                  [address] ?? [address] [address] ?? [place], [place]   [address]    [phone]   ?? Fax  [phone] ?? [email or web address]     Alan J. Cropp, M.D., F.C.C.P.  Marlis Edelson, D.O.,F.A.,C.O.I  Alesia Banda, M.D.,F.C.C.P  Jules Schick, M.D.,F.C.C.P.  Arnoldo Hooker, M.D.      Christina Robbins  March 26, 1946    VITAL SIGNS:  Vitals:    12/18/14 1355   BP: 120/70   Pulse: 88   Resp: 18   Temp: 97.2 ??F (36.2 ??C)   SpO2: 96%       EPWORTH:  Sleep Medicine 12/18/2014 06/12/2014   Sitting and reading 1 1   Watching TV 1 1   Sitting, inactive in a public place (e.g. a theatre or a meeting) 0 0   As a passenger in a car for an hour without a break 0 1   Lying down to rest in the afternoon when circumstances permit 1 1   Sitting and talking to someone 0 0   Sitting quietly after a lunch without alcohol 0 0   In a car, while stopped for a few minutes in traffic 0 0   Total score 3 4   Neck circumference 13.5 13.5       Patient seen today for sleep clinic. Reviewed Epworth Sleep Scale with patient. She voiced no concerns and has no questions regarding sleep. Patient also seen by DME and equipment checked.

## 2015-03-28 ENCOUNTER — Ambulatory Visit: Admit: 2015-03-28 | Discharge: 2015-03-28 | Payer: MEDICARE | Attending: Family Medicine | Primary: Family Medicine

## 2015-03-28 ENCOUNTER — Inpatient Hospital Stay: Attending: Family Medicine | Primary: Family Medicine

## 2015-03-28 DIAGNOSIS — Z9989 Dependence on other enabling machines and devices: Secondary | ICD-10-CM

## 2015-03-28 DIAGNOSIS — E785 Hyperlipidemia, unspecified: Secondary | ICD-10-CM

## 2015-03-28 MED ORDER — OMEPRAZOLE 20 MG PO CPDR
20 MG | ORAL_CAPSULE | Freq: Every day | ORAL | 2 refills | Status: AC
Start: 2015-03-28 — End: ?

## 2015-03-28 MED ORDER — PRAVASTATIN SODIUM 80 MG PO TABS
80 MG | ORAL_TABLET | Freq: Every day | ORAL | 2 refills | Status: AC
Start: 2015-03-28 — End: ?

## 2015-03-28 MED ORDER — BENAZEPRIL HCL 10 MG PO TABS
10 MG | ORAL_TABLET | Freq: Every day | ORAL | 2 refills | Status: AC
Start: 2015-03-28 — End: ?

## 2015-03-28 NOTE — Progress Notes (Signed)
Christina Robbins, a female of 69 y.o. came to the office 03/28/2015.     Patient Active Problem List   Diagnosis   ??? Preoperative testing   ??? Osteoarthritis of left knee   ??? Hyperlipidemia with target LDL less than 100   ??? Hypertension, essential, benign   ??? Vaginal vault prolapse, posthysterectomy   ??? 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 Rectocele   ??? OSA on CPAP   ??? Primary osteoarthritis of left knee        Visit Vitals   ??? BP 132/78   ??? Pulse 103   ??? Temp 97.5 ??F (36.4 ??C)   ??? Resp 16   ??? Ht 5' (1.524 m)   ??? Wt 197 lb (89.4 kg)   ??? SpO2 99%   ??? BMI 38.47 kg/m2       HPI  She is here for her six month visit.  She is feeling well and she is relocating to [place] at the end of January.    Family History   Problem Relation Age of Onset   ??? Osteoporosis Mother    ??? Heart Disease Mother    ??? Early Death Father        Past Surgical History   Procedure Laterality Date   ??? Knee arthroscopy Left    ??? Appendectomy     ??? Tonsillectomy     ??? Hysterectomy     ??? Total knee arthroplasty Left 11/02/2011   ??? Other surgical history  08/04/2012     anterior and posterior colporrhaphy   ??? Colonoscopy  10/19/2005        Past Medical History   Diagnosis Date   ??? Asthma      environmentally induced   ??? GERD (gastroesophageal reflux disease)    ??? Hiatal hernia      small per chest x ray 01-2013   ??? Hyperlipidemia    ??? Hypertension      120-130/70-80s averages   ??? 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 Necrosis (HCC)      autoimmune disease, oral cavity   ??? Osteoarthritis    ??? Sleep apnea      cpap       Social History     Social History   ??? Marital status: Married     Spouse name: N/A   ??? Number of children: N/A   ??? Years of education: N/A     Occupational History   ??? teacher-retired      Social History Main Topics   ??? Smoking status: Former Smoker     Packs/day: 3.00     Years: 3.00     Types: Cigarettes   ??? Smokeless tobacco: Never Used    ??? Alcohol use 0.0 oz/week     0 Standard drinks or equivalent per week      Comment: social   ??? Drug use: No      Comment: smoked in college   ??? Sexual activity: Not Currently     Other Topics Concern   ??? None     Social History Narrative       Allergies   Allergen Reactions   ??? Adhesive Tape      Paper tape only   ??? Codeine Hives     High doses only   ??? Entex [Ami-Tex] Swelling  tongue   ??? Fosamax [Alendronate]    ??? Reclast [Zoledronic Acid]    ??? Sulfa Antibiotics Hives       Current Outpatient Prescriptions on File Prior to Visit   Medication Sig Dispense Refill   ??? dapsone 100 MG tablet Take 100 mg by mouth daily     ??? CPAP Machine MISC Please provide patient with new CPAP machine and supplies as needed.  Diagnosis-OSA  [facility]axed to [facility] 1 each 0   ??? docusate sodium (COLACE, DULCOLAX) 100 MG CAPS Take 100 mg by mouth daily.     ??? aspirin 81 MG tablet Take 81 mg by mouth daily.     ??? ascorbic acid (VITAMIN C) 500 MG tablet Take 500 mg by mouth as needed.     ??? fluticasone (VERAMYST) 27.5 MCG/SPRAY nasal spray 2 sprays by Nasal route daily as needed.     ??? Multiple Vitamins-Calcium (VIACTIV MULTI-VITAMIN) CHEW Take 1 tablet by mouth daily      ??? amoxicillin (AMOXIL) 500 MG capsule Take 2,000 mg by mouth as needed. Prior dental procedures     ??? albuterol (PROVENTIL HFA;VENTOLIN HFA) 108 (90 BASE) MCG/ACT inhaler Inhale 2 puffs into the lungs every 6 hours as needed for Wheezing.       No current facility-administered medications on file prior to visit.        Review of Systems   Constitutional: Negative for appetite change, chills, fatigue, fever and unexpected weight change.   HENT: Negative for congestion, ear discharge, ear pain, facial swelling, mouth sores, postnasal drip, rhinorrhea, sinus pressure, sore throat, tinnitus and trouble swallowing.    Eyes: Negative.    Respiratory: Negative for cough, chest tightness, shortness of breath and wheezing.    Cardiovascular: Negative for chest pain, palpitations and  leg swelling.   Gastrointestinal: Negative for abdominal distention, abdominal pain, anal bleeding, blood in stool, constipation, diarrhea, nausea, rectal pain and vomiting.   Endocrine: Negative for polydipsia, polyphagia and polyuria.   Genitourinary: Negative for difficulty urinating, dysuria, frequency, hematuria and urgency.   Musculoskeletal: Negative for arthralgias, back pain, joint swelling and myalgias.   Skin: Negative for rash.   Allergic/Immunologic: Negative for environmental allergies and food allergies.   Neurological: Negative for dizziness, tremors, syncope, speech difficulty, weakness, light-headedness, numbness and headaches.   Hematological: Negative for adenopathy. Does not bruise/bleed easily.   Psychiatric/Behavioral: Negative for behavioral problems, decreased concentration, sleep disturbance and suicidal ideas. The patient is not nervous/anxious.        OBJECTIVE:    Physical Exam   Constitutional: She is oriented to person, place, and time. She appears well-developed and well-nourished.   HENT:   Head: Normocephalic and atraumatic.   Right Ear: External ear normal.   Left Ear: External ear normal.   Nose: Nose normal.   Mouth/Throat: Oropharynx is clear and moist.   Eyes: Conjunctivae are normal. Pupils are equal, round, and reactive to light. Right eye exhibits no discharge. Left eye exhibits no discharge. No scleral icterus.   Neck: Normal range of motion. Neck supple. No JVD present. No tracheal deviation present. No thyromegaly present.   Cardiovascular: Normal rate, regular rhythm and intact distal pulses.  Exam reveals no gallop and no friction rub.    Murmur (with a grade 2/6 systolic murmur.) heard.  Pulmonary/Chest: Effort normal and breath sounds normal. No stridor. No respiratory distress. She has no wheezes. She has no rales. She exhibits no tenderness.   Abdominal: Soft. Bowel sounds are normal. She  exhibits no distension and no mass. There is no tenderness. There is no  rebound and no guarding.   Musculoskeletal: Normal range of motion. She exhibits no edema or tenderness.   Lymphadenopathy:     She has no cervical adenopathy.   Neurological: She is alert and oriented to person, place, and time. She has normal reflexes. No cranial nerve deficit. She exhibits normal muscle tone. Coordination normal.   Skin: Skin is warm and dry. No rash noted. No erythema. No pallor.   Psychiatric: She has a normal mood and affect. Her behavior is normal. Judgment and thought content normal.       ASSESSMENT AND PLAN:    Christina Robbins was seen today for hypertension and hyperlipidemia.    Diagnoses and all orders for this visit:    OSA on CPAP    Primary osteoarthritis of left knee    Hyperlipidemia with target LDL less than 100  -     pravastatin (PRAVACHOL) 80 MG tablet; Take 1 tablet by mouth daily  -     CBC Auto Differential; Future  -     Comprehensive Metabolic Panel; Future  -     Lipid Panel; Future    Hypertension, essential, benign  -     benazepril (LOTENSIN) 10 MG tablet; Take 1 tablet by mouth daily    Gastroesophageal reflux disease without esophagitis  -     omeprazole (PRILOSEC) 20 MG delayed release capsule; Take 1 capsule by mouth Daily    Her problems are all stable and she feels well.  She is leaving at the end of January for [facility] and she is happy and well. Her knees are much better since the surgery they get a little stiff but very manageable.      Return in about 6 months (around 09/26/2015) for Blood work and visit, Biannual visit, Medication check, review of chronic problems, blood work.    Renata Caprice, DO

## 2015-03-29 LAB — CBC WITH AUTO DIFFERENTIAL
Basophils %: 0.7 % (ref 0.0–2.0)
Basophils Absolute: 0.06 E9/L (ref 0.00–0.20)
Eosinophils %: 2 % (ref 0.0–6.0)
Eosinophils Absolute: 0.16 E9/L (ref 0.05–0.50)
Hematocrit: 33.7 % — ABNORMAL LOW (ref 34.0–48.0)
Hemoglobin: 11 g/dL — ABNORMAL LOW (ref 11.5–15.5)
Immature Granulocytes #: 0.03 E9/L
Immature Granulocytes %: 0.4 % (ref 0.0–5.0)
Lymphocytes %: 23.6 % (ref 20.0–42.0)
Lymphocytes Absolute: 1.9 E9/L (ref 1.50–4.00)
MCH: 33.6 pg (ref 26.0–35.0)
MCHC: 32.6 % (ref 32.0–34.5)
MCV: 103.1 fL — ABNORMAL HIGH (ref 80.0–99.9)
MPV: 10 fL (ref 7.0–12.0)
Monocytes %: 7 % (ref 2.0–12.0)
Monocytes Absolute: 0.56 E9/L (ref 0.10–0.95)
Neutrophils %: 66.3 % (ref 43.0–80.0)
Neutrophils Absolute: 5.33 E9/L (ref 1.80–7.30)
Platelets: 229 E9/L (ref 130–450)
RBC: 3.27 E12/L — ABNORMAL LOW (ref 3.50–5.50)
RDW: 13.7 fL (ref 11.5–15.0)
WBC: 8 E9/L (ref 4.5–11.5)

## 2015-03-29 LAB — COMPREHENSIVE METABOLIC PANEL
ALT: 13 U/L (ref 0–32)
AST: 18 U/L (ref 0–31)
Albumin: 4.2 g/dL (ref 3.5–5.2)
Alkaline Phosphatase: 66 U/L (ref 35–104)
Anion Gap: 15 mmol/L (ref 7–16)
BUN: 16 mg/dL (ref 8–23)
CO2: 24 mmol/L (ref 22–29)
Calcium: 9 mg/dL (ref 8.6–10.2)
Chloride: 103 mmol/L (ref 98–107)
Creatinine: 0.5 mg/dL (ref 0.5–1.0)
GFR African American: >60
GFR Non-African American: >60 mL/min/{1.73_m2} (ref >=60)
Glucose: 89 mg/dL (ref 74–109)
Potassium: 4 mmol/L (ref 3.5–5.0)
Sodium: 142 mmol/L (ref 132–146)
Total Bilirubin: 0.7 mg/dL (ref 0.0–1.2)
Total Protein: 6.4 g/dL (ref 6.4–8.3)

## 2015-03-29 LAB — LIPID PANEL
Cholesterol, Total: 149 mg/dL (ref 0–199)
HDL: 52 mg/dL (ref >40)
LDL Calculated: 80 mg/dL (ref 0–99)
Triglycerides: 86 mg/dL (ref 0–149)
VLDL Cholesterol Calculated: 17 mg/dL

## 2015-04-01 NOTE — Telephone Encounter (Signed)
Spoke with patients husband he will tell her lab okay

## 2015-04-01 NOTE — Telephone Encounter (Signed)
-----   Message from Renata CapriceJoseph P Ambrose, DO sent at 03/28/2015  8:11 PM EST -----  The lab work is decent

## 2015-04-07 DIAGNOSIS — J3089 Other allergic rhinitis: Secondary | ICD-10-CM | POA: Diagnosis not present

## 2015-04-07 DIAGNOSIS — J301 Allergic rhinitis due to pollen: Secondary | ICD-10-CM | POA: Diagnosis not present

## 2015-04-21 DIAGNOSIS — J301 Allergic rhinitis due to pollen: Secondary | ICD-10-CM | POA: Diagnosis not present

## 2015-04-21 DIAGNOSIS — J3089 Other allergic rhinitis: Secondary | ICD-10-CM | POA: Diagnosis not present

## 2015-04-25 DIAGNOSIS — Z79899 Other long term (current) drug therapy: Secondary | ICD-10-CM | POA: Diagnosis not present

## 2015-05-12 DIAGNOSIS — J3089 Other allergic rhinitis: Secondary | ICD-10-CM | POA: Diagnosis not present

## 2015-05-12 DIAGNOSIS — J301 Allergic rhinitis due to pollen: Secondary | ICD-10-CM | POA: Diagnosis not present

## 2015-05-22 DIAGNOSIS — M359 Systemic involvement of connective tissue, unspecified: Secondary | ICD-10-CM | POA: Diagnosis not present

## 2015-05-26 DIAGNOSIS — J301 Allergic rhinitis due to pollen: Secondary | ICD-10-CM | POA: Diagnosis not present

## 2015-05-26 DIAGNOSIS — J3089 Other allergic rhinitis: Secondary | ICD-10-CM | POA: Diagnosis not present

## 2015-06-09 DIAGNOSIS — J3089 Other allergic rhinitis: Secondary | ICD-10-CM | POA: Diagnosis not present

## 2015-06-09 DIAGNOSIS — G4733 Obstructive sleep apnea (adult) (pediatric): Secondary | ICD-10-CM | POA: Diagnosis not present

## 2015-06-09 DIAGNOSIS — J301 Allergic rhinitis due to pollen: Secondary | ICD-10-CM | POA: Diagnosis not present

## 2015-06-11 ENCOUNTER — Ambulatory Visit: Admit: 2015-06-11 | Discharge: 2015-06-11 | Payer: MEDICARE | Attending: Family | Primary: Family Medicine

## 2015-06-11 DIAGNOSIS — Z9989 Dependence on other enabling machines and devices: Secondary | ICD-10-CM

## 2015-06-11 DIAGNOSIS — G4733 Obstructive sleep apnea (adult) (pediatric): Secondary | ICD-10-CM | POA: Diagnosis not present

## 2015-06-11 DIAGNOSIS — I10 Essential (primary) hypertension: Secondary | ICD-10-CM | POA: Diagnosis not present

## 2015-06-11 DIAGNOSIS — E668 Other obesity: Secondary | ICD-10-CM | POA: Diagnosis not present

## 2015-06-11 NOTE — Progress Notes (Signed)
Christina Robbins   06/11/15      INTERIM HISTORY  Christina Robbins is being seen today for the known diagnosis for Obstructive Sleep Apnea. She is  compliant with the use of her CPAP machine. She does well with her current setting of 7.9cm water pressure.  She uses the machine for 7 hours each night. Patient does not have headaches in the morning and does not have night sweats. She does feel refreshed in the morning and does not have daytime sleepiness. Patient does not take naps. She does not snore with the machine on at night and she does not wake up gagging, choking or snorting with the machine nocturnally. The patient does not have increased urination throughout the night. There is not recent chest pain or palpitations. Neck circumference: 14     Sleep Medicine 06/11/2015 12/18/2014 06/12/2014   Sitting and reading Watching TV 0 1 1   Sitting, inactive in a public place (e.g. a theatre or a meeting) 0 0 0   As a passenger in a car for an hour without a break 0 0 1   Lying down to rest in the afternoon when circumstances permit Sitting and talking to someone 0 0 0   Sitting quietly after a lunch without alcohol 0 0 0   In a car, while stopped for a few minutes in traffic 0 0 0   Total score Neck circumference 14 13.5 13.5        ALLERGIES  Allergies   Allergen Reactions   ??? Adhesive Tape      Paper tape only   ??? Codeine Hives     High doses only   ??? Entex [Ami-Tex] Swelling     tongue   ??? Fosamax [Alendronate]    ??? Reclast [Zoledronic Acid]    ??? Sulfa Antibiotics Hives       SOCIAL HISTORY  She  reports that she has quit smoking. Her smoking use included Cigarettes. She has a 9.00 pack-year smoking history. She has never used smokeless tobacco. She reports that she drinks alcohol. She reports that she does not use illicit drugs.    PAST MEDICAL HISTORY  She  has a past medical history of Asthma; GERD (gastroesophageal reflux disease); Hiatal hernia; Hyperlipidemia; Hypertension; LABD (linear  immunoglobulin A bullous dermatosis); Necrosis (HCC); Osteoarthritis; and Sleep apnea.     MEDICATIONS      The patient has a current medication list which includes the following prescription(s): levocetirizine, benazepril, omeprazole, pravastatin, dapsone, cpap machine, docusate, amoxicillin, albuterol sulfate hfa, aspirin, ascorbic acid, fluticasone, and viactiv multi-vitamin.    IMMUNIZATIONS  Immunizations are up to date    REVIEW OF SYSTEMS  The patient has no problems with hearing. There is no problem with seeing, smelling, or tasting. There is no history of seizure, or stroke. She has orthostatic hypotension with squatting.  There is no history of hemoptysis, hematemesis, hematochezia, hematuria, or dysuria. 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  Other than as noted above, the review of systems is unremarkable.     OBJECTIVE: On physical examination, the patient is awake and alert and in no acute distress. BP 126/84 (Site: Left Arm)  Pulse 114  Temp 97.7 ??F (36.5 ??C) (Tympanic)  Resp 16  Ht  (1.549 m)  Wt 207 lb (93.9 kg)  SpO2 93%  BMI 39.11 kg/m2 Body mass index is  39.11 kg/(m^2). Nose and throat examination shows no erythema or exudates. Neck is supple without JVD, adenopathy or thyromegaly. Chest has symmetrical excursion. Breath sounds are equal. Lungs are clear to auscultation and percussion. Cardiovascular exam reveals the heart rhythm to be regular. No murmur or gallops are present. Abdomen has no masses or organomegaly. Extremities show no signs of clubbing, cyanosis or edema. Skin is warm and dry and without rash. GAIT is normal.      DIAGNOSIS  1. OSA on CPAP     2. Obesity with sleep apnea     3. Hypertension, essential, benign         DISCUSSION   Christina Robbins seems to be doing well.     PLAN:  Machine compliance was discussed  Good sleep hygiene was discussed  Weight management was discussed  Weight loss is encouraged  Flu vaccine is up to date  Encouraged to call  with any questions or concerns  Sleep clinic in 6 months  Adjust auto range to 4-10

## 2015-06-11 NOTE — Telephone Encounter (Signed)
[name] was in for an appointment today and informed us that she will be moving to [place] in 6 weeks

## 2015-06-16 DIAGNOSIS — J3089 Other allergic rhinitis: Secondary | ICD-10-CM | POA: Diagnosis not present

## 2015-06-16 DIAGNOSIS — J301 Allergic rhinitis due to pollen: Secondary | ICD-10-CM | POA: Diagnosis not present

## 2015-06-30 DIAGNOSIS — J3089 Other allergic rhinitis: Secondary | ICD-10-CM | POA: Diagnosis not present

## 2015-06-30 DIAGNOSIS — J301 Allergic rhinitis due to pollen: Secondary | ICD-10-CM | POA: Diagnosis not present

## 2015-07-17 DIAGNOSIS — J301 Allergic rhinitis due to pollen: Secondary | ICD-10-CM | POA: Diagnosis not present

## 2015-07-17 DIAGNOSIS — J3089 Other allergic rhinitis: Secondary | ICD-10-CM | POA: Diagnosis not present

## 2015-07-29 DIAGNOSIS — J3089 Other allergic rhinitis: Secondary | ICD-10-CM | POA: Diagnosis not present

## 2015-07-29 DIAGNOSIS — J301 Allergic rhinitis due to pollen: Secondary | ICD-10-CM | POA: Diagnosis not present

## 2015-08-01 DIAGNOSIS — H25013 Cortical age-related cataract, bilateral: Secondary | ICD-10-CM | POA: Diagnosis not present

## 2015-08-04 DIAGNOSIS — J3089 Other allergic rhinitis: Secondary | ICD-10-CM | POA: Diagnosis not present

## 2015-08-04 DIAGNOSIS — J301 Allergic rhinitis due to pollen: Secondary | ICD-10-CM | POA: Diagnosis not present

## 2015-08-18 ENCOUNTER — Emergency Department
Admission: EM | Admit: 2015-08-18 | Discharge: 2015-08-18 | Disposition: A | Discharge status: Home / Self Care | Payer: Medicare HMO | Attending: Emergency Medicine | Admitting: Emergency Medicine

## 2015-08-18 ENCOUNTER — Emergency Department: Payer: Medicare HMO

## 2015-08-18 ENCOUNTER — Encounter: Payer: Self-pay | Admitting: *Deleted

## 2015-08-18 DIAGNOSIS — W1839XA Other fall on same level, initial encounter: Secondary | ICD-10-CM | POA: Diagnosis not present

## 2015-08-18 DIAGNOSIS — Z87891 Personal history of nicotine dependence: Secondary | ICD-10-CM | POA: Insufficient documentation

## 2015-08-18 DIAGNOSIS — Y939 Activity, unspecified: Secondary | ICD-10-CM | POA: Insufficient documentation

## 2015-08-18 DIAGNOSIS — I10 Essential (primary) hypertension: Secondary | ICD-10-CM | POA: Diagnosis not present

## 2015-08-18 DIAGNOSIS — T148 Other injury of unspecified body region: Secondary | ICD-10-CM | POA: Diagnosis not present

## 2015-08-18 DIAGNOSIS — R42 Dizziness and giddiness: Secondary | ICD-10-CM

## 2015-08-18 DIAGNOSIS — Y929 Unspecified place or not applicable: Secondary | ICD-10-CM | POA: Insufficient documentation

## 2015-08-18 DIAGNOSIS — Y999 Unspecified external cause status: Secondary | ICD-10-CM | POA: Insufficient documentation

## 2015-08-18 DIAGNOSIS — T148XXA Other injury of unspecified body region, initial encounter: Secondary | ICD-10-CM

## 2015-08-18 DIAGNOSIS — S0083XA Contusion of other part of head, initial encounter: Secondary | ICD-10-CM | POA: Diagnosis not present

## 2015-08-18 DIAGNOSIS — S0081XA Abrasion of other part of head, initial encounter: Secondary | ICD-10-CM | POA: Diagnosis not present

## 2015-08-18 HISTORY — DX: Nonrheumatic mitral (valve) prolapse: I34.1

## 2015-08-18 HISTORY — DX: Other specified bullous disorders: L13.8

## 2015-08-18 HISTORY — DX: Essential (primary) hypertension: I10

## 2015-08-18 LAB — BASIC METABOLIC PANEL
ANION GAP: 7 (ref 5–15)
BUN: 19 mg/dL (ref 6–20)
CALCIUM: 9.1 mg/dL (ref 8.9–10.3)
CO2: 24 mmol/L (ref 22–32)
Chloride: 109 mmol/L (ref 101–111)
Creatinine, Ser: 0.4 mg/dL — ABNORMAL LOW (ref 0.44–1.00)
GFR calc Af Amer: >60 mL/min (ref >60)
GFR calc non Af Amer: >60 mL/min (ref >60)
GLUCOSE: 99 mg/dL (ref 65–99)
POTASSIUM: 3.6 mmol/L (ref 3.5–5.1)
Sodium: 140 mmol/L (ref 135–145)

## 2015-08-18 LAB — CBC WITH DIFFERENTIAL/PLATELET
Basophils Absolute: 0.1 10*3/uL (ref 0–0.1)
EOS ABS: 0.2 10*3/uL (ref 0–0.7)
HEMATOCRIT: 35.4 % (ref 35.0–47.0)
Hemoglobin: 12.2 g/dL (ref 12.0–16.0)
Lymphocytes Relative: 15 %
Lymphs Abs: 1.6 10*3/uL (ref 1.0–3.6)
MCH: 33.8 pg (ref 26.0–34.0)
MCHC: 34.4 g/dL (ref 32.0–36.0)
MCV: 98.4 fL (ref 80.0–100.0)
MONO ABS: 0.6 10*3/uL (ref 0.2–0.9)
NEUTROS ABS: 8.3 10*3/uL — AB (ref 1.4–6.5)
Neutrophils Relative %: 76 %
PLATELETS: 212 10*3/uL (ref 150–440)
RBC: 3.6 MIL/uL — ABNORMAL LOW (ref 3.80–5.20)
RDW: 13.2 % (ref 11.5–14.5)
WBC: 10.8 10*3/uL (ref 3.6–11.0)

## 2015-08-18 LAB — TSH: TSH: 0.857 u[IU]/mL (ref 0.350–4.500)

## 2015-08-18 LAB — PROTIME-INR
INR: 0.96
PROTHROMBIN TIME: 13 s (ref 11.4–15.0)

## 2015-08-18 LAB — TROPONIN I: Troponin I: <0.03 ng/mL (ref <0.031)

## 2015-08-18 MED ORDER — TETANUS-DIPHTH-ACELL PERTUSSIS 5-2.5-18.5 LF-MCG/0.5 IM SUSP
INTRAMUSCULAR | Status: AC
  Administered 2015-08-18: 0.5 mL via INTRAMUSCULAR
  Filled 2015-08-18: qty 0.5

## 2015-08-18 MED ORDER — IOPAMIDOL (ISOVUE-370) INJECTION 76%
75.0000 mL | Freq: Once | INTRAVENOUS | Status: AC | PRN
  Administered 2015-08-18: 75 mL via INTRAVENOUS
  Filled 2015-08-18: qty 75

## 2015-08-18 MED ORDER — TETANUS-DIPHTH-ACELL PERTUSSIS 5-2.5-18.5 LF-MCG/0.5 IM SUSP
0.5000 mL | Freq: Once | INTRAMUSCULAR | Status: AC
  Administered 2015-08-18: 0.5 mL via INTRAMUSCULAR

## 2015-08-18 MED ORDER — MECLIZINE HCL 25 MG PO TABS
ORAL_TABLET | ORAL | Status: AC
  Administered 2015-08-18: 25 mg via ORAL
  Filled 2015-08-18: qty 1

## 2015-08-18 MED ORDER — MECLIZINE HCL 25 MG PO TABS: 25.0000 mg | ORAL_TABLET | Freq: Three times a day (TID) | ORAL | Status: DC | PRN

## 2015-08-18 MED ORDER — ONDANSETRON HCL 4 MG PO TABS: 4.0000 mg | ORAL_TABLET | Freq: Every day | ORAL | Status: DC | PRN

## 2015-08-18 MED ORDER — SODIUM CHLORIDE 0.9 % IV BOLUS (SEPSIS)
500.0000 mL | Freq: Once | INTRAVENOUS | Status: AC
  Administered 2015-08-18: 500 mL via INTRAVENOUS

## 2015-08-18 MED ORDER — MECLIZINE HCL 25 MG PO TABS
25.0000 mg | ORAL_TABLET | Freq: Once | ORAL | Status: AC
  Administered 2015-08-18: 25 mg via ORAL

## 2015-08-18 NOTE — ED Notes (Signed)
MD at bedside to update pt

## 2015-08-18 NOTE — ED Provider Notes (Addendum)
[facility] Emergency Department Provider Note  ____________________________________________   I have reviewed the triage vital signs and the nursing notes.   HISTORY  Chief Complaint Dizziness and Fall    HPI Lindsay Stephenson is a 70 y.o. female who is largely healthy not on any blood thinners states that last Thursday, today is Monday, she had an episode of vertigo in her house. She is bending over to put her head under the sink and she felt very dizzy with true spinning and fell to the ground. Did not pass out. No chest pain or shortness of breath. The dizziness lasted for a few minutes and went away and she is back to normal until today when she was bending over to pick weeds. She became acutely vertiginous again with contra clockwise spinning noted and fell, hitting her head. Did not pass out. No chest pain or shortness of breath that time either. No focal numbness or weakness. Denies headache. Patient sustained an abrasion and bruising to her face. She denies any neck pain. Her symptoms again lasted for a few minutes and completely resolved. No difficulty speaking or walking or talking otherwise. Denies any other complaints at this time is in her normal state of health.     Past Medical History  Diagnosis Date  . LABD (linear immunoglobulin A bullous dermatosis)   . Hypertension     There are no active problems to display for this patient.   Past Surgical History  Procedure Laterality Date  . Replacement total knee bilateral    . Tonsillectomy    . Appendectomy    . Abdominal hysterectomy      No current outpatient prescriptions on file.  Allergies Codeine; Entex lq; and Sulfa antibiotics  History reviewed. No pertinent family history.  Social History Social History  Substance Use Topics  . Smoking status: Former Research scientist (life sciences)  . Smokeless tobacco: None  . Alcohol Use: No    Review of Systems Constitutional: No  fever/chills Eyes: No visual changes. ENT: No sore throat. No stiff neck no neck pain Cardiovascular: Denies chest pain. Respiratory: Denies shortness of breath. Gastrointestinal:   no vomiting.  No diarrhea.  No constipation. Genitourinary: Negative for dysuria. Musculoskeletal: Negative lower extremity swelling Skin: Negative for rash. Neurological: Negative for headaches, focal weakness or numbness. 10-point ROS otherwise negative.  ____________________________________________   PHYSICAL EXAM:  VITAL SIGNS: ED Triage Vitals  Enc Vitals Group     BP 08/18/15 1236 135/69 mmHg     Pulse Rate 08/18/15 1236 89     Resp 08/18/15 1236 18     Temp 08/18/15 1236 98.1 F (36.7 C)     Temp Source 08/18/15 1236 Oral     SpO2 08/18/15 1236 96 %     Weight 08/18/15 1242 200 lb (90.719 kg)     Height 08/18/15 1242 5' (1.524 m)     Head Cir --      Peak Flow --      Pain Score 08/18/15 1242 4     Pain Loc --      Pain Edu? --      Excl. in [place]? --     Constitutional: Alert and oriented. Well appearing and in no acute distress. Eyes: Conjunctivae are normal. PERRL. EOMI. Head: Bruising noted around the right eye with no hyphema noted. Mild abrasion nothing that requires suture. Nose: No congestion/rhinnorhea. Mouth/Throat: Mucous membranes are moist.  Oropharynx non-erythematous., TMs are normal bilaterally Neck: No stridor.  Nontender with no meningismus Cardiovascular: Normal rate, regular rhythm. Grossly normal heart sounds.  Good peripheral circulation. Respiratory: Normal respiratory effort.  No retractions. Lungs CTAB. Abdominal: Soft and nontender. No distention. No guarding no rebound Back:  There is no focal tenderness or step off there is no midline tenderness there are no lesions noted. there is no CVA tenderness Musculoskeletal: No lower extremity tenderness. No joint effusions, no DVT signs strong distal pulses no edema Neurologic:  Patient with a few beats of  lateralizing vertigo otherwise Cranial nerves II through XII are grossly intact 5 out of 5 strength bilateral upper and lower extremity. Finger to nose within normal limits heel to shin within normal limits, speech is normal with no word finding difficulty or dysarthria, reflexes symmetric, pupils are equally round and reactive to light, there is no pronator drift, sensation is normal, vision is intact to confrontation, gait is deferred, there is no nystagmus, normal neurologic exam Skin:  Skin is warm, dry and intact. No rash noted. Psychiatric: Mood and affect are normal. Speech and behavior are normal.  ____________________________________________   LABS (all labs ordered are listed, but only abnormal results are displayed)  Labs Reviewed  TROPONIN I  CBC WITH DIFFERENTIAL/PLATELET  URINALYSIS COMPLETEWITH MICROSCOPIC ([facility] ONLY)  TSH  PROTIME-INR  BASIC METABOLIC PANEL   ____________________________________________  EKG  I personally interpreted any EKGs ordered by me or triage Normal sinus rhythm rate 80 bpm no acute ST elevation or acute ST depression normal axis unremarkable EKG ____________________________________________  RADIOLOGY  I reviewed any imaging ordered by me or triage that were performed during my shift and, if possible, patient and/or family made aware of any abnormal findings. ____________________________________________   PROCEDURES  Procedure(s) performed: None  Critical Care performed: None  ____________________________________________   INITIAL IMPRESSION / ASSESSMENT AND PLAN / ED COURSE  Pertinent labs & imaging results that were available during my care of the patient were reviewed by me and considered in my medical decision making (see chart for details).  Patient with true vertigo intermittently over the last 3 days associated with falls. I did ask her to look up and she states that it made her feel a little bit lightheaded and shortly  thereafter she had another event. This certainly is consistent with Positional vertigo, very low suspicion that is a recurrent positional stroke, given that the patient can bring out the symptoms by changing position. Concern does exist for possible vertebral artery insufficiency however given that both times it happened initially she was bending over an awkward position with her neck. We will obtain a CT CTA to evaluate the vascular over head and brain, she is neurologically intact when she is not having vertiginous symptoms and again we were able to bring up by moving her head. Specifically, patient states that she moved to the left to talk to the registration staff member and had an acute onset of vertigo lasted for a few moments and then subsided.  ----------------------------------------- 7:45 PM on 08/18/2015 -----------------------------------------  Patient with no signs or symptoms of CVA at this time, continues well with no symptoms. Neurologic exam remains normal. NIH stroke scale remained 0. CT had CTA and CT face are negative. Patient likely has peripheral vertigo. No evidence of a CVA. Especially given the intermittent and positional nature of the symptoms. We'll discuss with neurology, I feel she likely is safe for discharge.  ----------------------------------------- 8:38 PM on 08/18/2015 -----------------------------------------  Dr. Cristobal Goldmann of neurology feels the patient should go home and  not be admitted. We discussed her findings including CT CTA CT head and face, vital signs symptoms blood work and findings. Patient is very coupled with this plan she is not vertiginous at this time we will discharge her with extensive outpatient and return cautions given understood ____________________________________________   FINAL CLINICAL IMPRESSION(S) / ED DIAGNOSES  Final diagnoses:  Vertigo  Contusion  Vertigo  Contusion      This chart was dictated using voice recognition  software.  Despite best efforts to proofread,  errors can occur which can change meaning.     Schuyler Amor, MD 08/18/15 [place], MD 08/18/15 [place], MD 08/18/15 2039

## 2015-08-18 NOTE — ED Notes (Signed)
Pt able to ambulate with one assist to restroom. Pt reports having one episode of dizziness while walking.

## 2015-08-18 NOTE — ED Notes (Signed)
Pt arrived to ED from home reporting a fall while at home this morning. Pt reports she leaned forward while outside and "everything began spinning violently" Pt reports she lost balance and fell face first into gravel. Pt presents with brusing and swelling noted to right eye and right side of face. Pt reports having a similar event two days ago after leaning over the bathroom sink. Pt did not fall but reports similar spinning of the room and difficulty keeping balance. Pt presents A&O x 4. Pt denies dizziness at this time but reports neck and right sided facial pain.

## 2015-08-25 DIAGNOSIS — G4733 Obstructive sleep apnea (adult) (pediatric): Secondary | ICD-10-CM | POA: Diagnosis not present

## 2015-08-26 DIAGNOSIS — S199XXA Unspecified injury of neck, initial encounter: Secondary | ICD-10-CM | POA: Diagnosis not present

## 2015-08-26 DIAGNOSIS — R1312 Dysphagia, oropharyngeal phase: Secondary | ICD-10-CM | POA: Diagnosis not present

## 2015-08-26 DIAGNOSIS — R42 Dizziness and giddiness: Secondary | ICD-10-CM | POA: Diagnosis not present

## 2015-09-04 DIAGNOSIS — H903 Sensorineural hearing loss, bilateral: Secondary | ICD-10-CM | POA: Diagnosis not present

## 2015-09-04 DIAGNOSIS — R42 Dizziness and giddiness: Secondary | ICD-10-CM | POA: Diagnosis not present

## 2015-09-11 ENCOUNTER — Other Ambulatory Visit: Payer: Self-pay | Admitting: Otolaryngology

## 2015-09-11 DIAGNOSIS — J301 Allergic rhinitis due to pollen: Secondary | ICD-10-CM | POA: Diagnosis not present

## 2015-09-11 DIAGNOSIS — R42 Dizziness and giddiness: Secondary | ICD-10-CM | POA: Diagnosis not present

## 2015-09-11 DIAGNOSIS — J4520 Mild intermittent asthma, uncomplicated: Secondary | ICD-10-CM | POA: Diagnosis not present

## 2015-09-15 NOTE — Telephone Encounter (Signed)
Yes OK for MRI

## 2015-09-15 NOTE — Telephone Encounter (Signed)
Informed patient of RJB response.

## 2015-09-15 NOTE — Telephone Encounter (Signed)
Patient called, now living in [place].  Scheduled for MRI brain in the next 1 - 2 weeks.  Asking if ok to have MRI due to TKA's done in 2013 and 2014?   Phone # [phone]

## 2015-09-17 DIAGNOSIS — Z6838 Body mass index (BMI) 38.0-38.9, adult: Secondary | ICD-10-CM | POA: Diagnosis not present

## 2015-09-17 DIAGNOSIS — K219 Gastro-esophageal reflux disease without esophagitis: Secondary | ICD-10-CM | POA: Diagnosis not present

## 2015-09-17 DIAGNOSIS — J4520 Mild intermittent asthma, uncomplicated: Secondary | ICD-10-CM | POA: Diagnosis not present

## 2015-09-17 DIAGNOSIS — I10 Essential (primary) hypertension: Secondary | ICD-10-CM | POA: Diagnosis not present

## 2015-09-17 DIAGNOSIS — J8489 Other specified interstitial pulmonary diseases: Secondary | ICD-10-CM | POA: Diagnosis not present

## 2015-09-17 DIAGNOSIS — E785 Hyperlipidemia, unspecified: Secondary | ICD-10-CM | POA: Diagnosis not present

## 2015-09-17 DIAGNOSIS — Z0000 Encounter for general adult medical examination without abnormal findings: Secondary | ICD-10-CM | POA: Diagnosis not present

## 2015-09-29 DIAGNOSIS — M76822 Posterior tibial tendinitis, left leg: Secondary | ICD-10-CM | POA: Diagnosis not present

## 2015-09-29 DIAGNOSIS — M76821 Posterior tibial tendinitis, right leg: Secondary | ICD-10-CM | POA: Diagnosis not present

## 2015-09-30 ENCOUNTER — Encounter: Payer: Self-pay | Admitting: Family Medicine

## 2015-09-30 ENCOUNTER — Ambulatory Visit (INDEPENDENT_AMBULATORY_CARE_PROVIDER_SITE_OTHER): Payer: Medicare HMO | Admitting: Family Medicine

## 2015-09-30 VITALS — BP 120/80 | HR 70 | Ht 60.0 in | Wt 203.0 lb

## 2015-09-30 DIAGNOSIS — Z7189 Other specified counseling: Secondary | ICD-10-CM | POA: Diagnosis not present

## 2015-09-30 DIAGNOSIS — E785 Hyperlipidemia, unspecified: Secondary | ICD-10-CM | POA: Diagnosis not present

## 2015-09-30 DIAGNOSIS — Z7689 Persons encountering health services in other specified circumstances: Secondary | ICD-10-CM

## 2015-09-30 DIAGNOSIS — L138 Other specified bullous disorders: Secondary | ICD-10-CM | POA: Diagnosis not present

## 2015-09-30 DIAGNOSIS — I10 Essential (primary) hypertension: Secondary | ICD-10-CM | POA: Diagnosis not present

## 2015-09-30 DIAGNOSIS — K219 Gastro-esophageal reflux disease without esophagitis: Secondary | ICD-10-CM

## 2015-09-30 MED ORDER — OMEPRAZOLE 20 MG PO CPDR: 20.0000 mg | DELAYED_RELEASE_CAPSULE | Freq: Every day | ORAL | Status: DC

## 2015-09-30 MED ORDER — BENAZEPRIL HCL 10 MG PO TABS
10.0000 mg | ORAL_TABLET | Freq: Every day | ORAL | Status: DC
Start: 2015-09-30 — End: 2015-10-01

## 2015-09-30 MED ORDER — PRAVASTATIN SODIUM 80 MG PO TABS: 80.0000 mg | ORAL_TABLET | Freq: Every day | ORAL | Status: DC

## 2015-09-30 NOTE — Progress Notes (Addendum)
Name: Lindsay Stephenson   MRN: [id number]    DOB: [birth year]   Date:10/01/2015       Progress Note  Subjective  Chief Complaint  Chief Complaint  Patient presents with  . Establish Care  . Gastroesophageal Reflux  . Hyperlipidemia  . Hypertension    Gastroesophageal Reflux She reports no abdominal pain, no belching, no chest pain, no choking, no coughing, no dysphagia, no early satiety, no globus sensation, no heartburn, no hoarse voice, no nausea, no sore throat, no stridor, no tooth decay, no water brash or no wheezing. This is a chronic problem. The current episode started more than 1 year ago. The problem occurs frequently. The problem has been gradually improving. Nothing aggravates the symptoms. Pertinent negatives include no anemia, fatigue, melena, muscle weakness, orthopnea or weight loss. She has tried a PPI for the symptoms. The treatment provided mild relief.  Hyperlipidemia This is a chronic problem. The current episode started more than 1 year ago. The problem is controlled. She has no history of chronic renal disease, diabetes, hypothyroidism, liver disease, obesity or nephrotic syndrome. There are no known factors aggravating her hyperlipidemia. Pertinent negatives include no chest pain, focal weakness, leg pain, myalgias or shortness of breath. Current antihyperlipidemic treatment includes statins. The current treatment provides mild improvement of lipids. There are no compliance problems.   Hypertension This is a chronic problem. The current episode started more than 1 year ago. The problem has been waxing and waning since onset. The problem is controlled. Pertinent negatives include no blurred vision, chest pain, headaches, neck pain, orthopnea, palpitations, PND, shortness of breath or sweats. Risk factors for coronary artery disease include post-menopausal state. Past treatments include beta blockers. The current treatment provides mild improvement. There is no history of angina,  kidney disease, CAD/MI, CVA, heart failure, left ventricular hypertrophy, PVD, renovascular disease or retinopathy. There is no history of chronic renal disease or a hypertension causing med.    No problem-specific assessment & plan notes found for this encounter.   Past Medical History  Diagnosis Date  . LABD (linear immunoglobulin A bullous dermatosis)   . Hypertension   . Mitral valve prolapse   . GERD (gastroesophageal reflux disease)   . Hyperlipidemia     Past Surgical History  Procedure Laterality Date  . Replacement total knee bilateral    . Tonsillectomy    . Appendectomy    . Abdominal hysterectomy      History reviewed. No pertinent family history.  Social History   Social History  . Marital Status: Married    Spouse Name: N/A  . Number of Children: N/A  . Years of Education: N/A   Occupational History  . Not on file.   Social History Main Topics  . Smoking status: Former Research scientist (life sciences)  . Smokeless tobacco: Not on file  . Alcohol Use: No  . Drug Use: No  . Sexual Activity: Not on file   Other Topics Concern  . Not on file   Social History Narrative    Allergies  Allergen Reactions  . Codeine Other (See Comments)    Pt reports only having symptoms after receiving large amounts of medication  . Entex Lq [Phenylephrine-Guaifenesin] Swelling  . Sulfa Antibiotics Itching     Review of Systems  Constitutional: Negative for weight loss and fatigue.  HENT: Negative for hoarse voice and sore throat.   Eyes: Negative for blurred vision.  Respiratory: Negative for cough, choking, shortness of breath and wheezing.   Cardiovascular: Negative  for chest pain, palpitations, orthopnea and PND.  Gastrointestinal: Negative for heartburn, dysphagia, nausea, abdominal pain and melena.  Musculoskeletal: Negative for myalgias, muscle weakness and neck pain.  Neurological: Negative for focal weakness and headaches.     Objective  Filed Vitals:   09/30/15 1445   BP: 120/80  Pulse: 70  Height: 5' (1.524 m)  Weight: 203 lb (92.08 kg)    Physical Exam  Constitutional: She is well-developed, well-nourished, and in no distress. No distress.  HENT:  Head: Normocephalic and atraumatic.  Right Ear: External ear normal.  Left Ear: External ear normal.  Nose: Nose normal.  Mouth/Throat: Oropharynx is clear and moist.  Eyes: Conjunctivae and EOM are normal. Pupils are equal, round, and reactive to light. Right eye exhibits no discharge. Left eye exhibits no discharge.  Neck: Normal range of motion. Neck supple. No JVD present. No thyromegaly present.  Cardiovascular: Normal rate, regular rhythm, normal heart sounds and intact distal pulses.  Exam reveals no gallop and no friction rub.   No murmur heard. Pulmonary/Chest: Effort normal and breath sounds normal. She has no wheezes. She has no rales.  Abdominal: Soft. Bowel sounds are normal. She exhibits no mass. There is no tenderness. There is no guarding.  Musculoskeletal: Normal range of motion. She exhibits no edema.  Lymphadenopathy:    She has no cervical adenopathy.  Neurological: She is alert.  Skin: Skin is warm and dry. She is not diaphoretic.  Psychiatric: Mood and affect normal.  Nursing note and vitals reviewed.     Assessment & Plan  Problem List Items Addressed This Visit      Cardiovascular and Mediastinum   Essential hypertension   Relevant Medications   aspirin 81 MG tablet   pravastatin (PRAVACHOL) 80 MG tablet   benazepril (LOTENSIN) 10 MG tablet     Digestive   Esophageal reflux   Relevant Medications   docusate sodium (COLACE) 100 MG capsule   omeprazole (PRILOSEC) 20 MG capsule     Musculoskeletal and Integument   Linear IgA bullous dermatosis     Other   Hyperlipidemia   Relevant Medications   aspirin 81 MG tablet   pravastatin (PRAVACHOL) 80 MG tablet   benazepril (LOTENSIN) 10 MG tablet    Other Visit Diagnoses    Encounter to establish care with new  doctor    -  Primary         Dr. Deanna Jones [facility]  10/01/2015

## 2015-10-01 ENCOUNTER — Encounter: Payer: Self-pay | Admitting: Family Medicine

## 2015-10-01 ENCOUNTER — Ambulatory Visit
Admission: RE | Admit: 2015-10-01 | Discharge: 2015-10-01 | Disposition: A | Discharge status: Home / Self Care | Payer: Medicare HMO | Source: Ambulatory Visit | Attending: Otolaryngology | Admitting: Otolaryngology

## 2015-10-01 DIAGNOSIS — I10 Essential (primary) hypertension: Secondary | ICD-10-CM | POA: Insufficient documentation

## 2015-10-01 DIAGNOSIS — R42 Dizziness and giddiness: Secondary | ICD-10-CM | POA: Insufficient documentation

## 2015-10-01 DIAGNOSIS — L138 Other specified bullous disorders: Secondary | ICD-10-CM | POA: Insufficient documentation

## 2015-10-01 DIAGNOSIS — K219 Gastro-esophageal reflux disease without esophagitis: Secondary | ICD-10-CM | POA: Insufficient documentation

## 2015-10-01 DIAGNOSIS — E785 Hyperlipidemia, unspecified: Secondary | ICD-10-CM | POA: Insufficient documentation

## 2015-10-01 MED ORDER — PRAVASTATIN SODIUM 80 MG PO TABS
80.0000 mg | ORAL_TABLET | Freq: Every day | ORAL | Status: DC
Start: 2015-10-01 — End: 2016-04-02

## 2015-10-01 MED ORDER — BENAZEPRIL HCL 10 MG PO TABS
10.0000 mg | ORAL_TABLET | Freq: Every day | ORAL | Status: DC
Start: 2015-10-01 — End: 2015-10-22

## 2015-10-01 MED ORDER — GADOBENATE DIMEGLUMINE 529 MG/ML IV SOLN
20.0000 mL | Freq: Once | INTRAVENOUS | Status: AC | PRN
  Administered 2015-10-01: 19 mL via INTRAVENOUS

## 2015-10-01 MED ORDER — OMEPRAZOLE 20 MG PO CPDR: 20.0000 mg | DELAYED_RELEASE_CAPSULE | Freq: Every day | ORAL | Status: DC

## 2015-10-01 NOTE — Addendum Note (Signed)
Addended by: Juline Patch on: 10/01/2015 08:01 AM   Modules accepted: Orders

## 2015-10-14 DIAGNOSIS — Z79899 Other long term (current) drug therapy: Secondary | ICD-10-CM | POA: Diagnosis not present

## 2015-10-14 DIAGNOSIS — L1089 Other pemphigus: Secondary | ICD-10-CM | POA: Diagnosis not present

## 2015-10-14 DIAGNOSIS — L138 Other specified bullous disorders: Secondary | ICD-10-CM | POA: Diagnosis not present

## 2015-10-22 ENCOUNTER — Encounter: Payer: Self-pay | Admitting: Family Medicine

## 2015-10-22 ENCOUNTER — Ambulatory Visit (INDEPENDENT_AMBULATORY_CARE_PROVIDER_SITE_OTHER): Payer: Medicare HMO | Admitting: Family Medicine

## 2015-10-22 VITALS — BP 116/76 | HR 86 | Temp 98.2°F | Resp 16 | Ht 60.0 in | Wt 204.0 lb

## 2015-10-22 DIAGNOSIS — R55 Syncope and collapse: Secondary | ICD-10-CM

## 2015-10-22 DIAGNOSIS — I952 Hypotension due to drugs: Secondary | ICD-10-CM

## 2015-10-22 DIAGNOSIS — R42 Dizziness and giddiness: Secondary | ICD-10-CM

## 2015-10-22 DIAGNOSIS — R002 Palpitations: Secondary | ICD-10-CM | POA: Diagnosis not present

## 2015-10-22 DIAGNOSIS — R5383 Other fatigue: Secondary | ICD-10-CM

## 2015-10-22 NOTE — Progress Notes (Signed)
Name: Lindsay Stephenson   MRN: [id number]    DOB: 09/26/1945   Date:10/22/2015       Progress Note  Subjective  Chief Complaint  Chief Complaint  Patient presents with  . Dizziness    All symptoms happen together intermitting - began in March and have gotten worse/more frequent   . Night Sweats  . Extremity Weakness  . Numbness    Facial/lip numbness   . Fatigue  . Shortness of Breath    Dizziness This is a recurrent problem. The current episode started more than 1 month ago. The problem occurs intermittently. The problem has been gradually worsening. Associated symptoms include diaphoresis and fatigue. Pertinent negatives include no abdominal pain, chest pain, chills, coughing, headaches, myalgias, nausea, neck pain, rash, sore throat or visual change. Nothing aggravates the symptoms. She has tried nothing for the symptoms. The treatment provided no relief.  Shortness of Breath This is a new problem. The current episode started more than 1 year ago. The problem occurs daily. The problem has been rapidly worsening. Pertinent negatives include no abdominal pain, chest pain, ear pain, headaches, leg swelling, neck pain, rash, sore throat, sputum production or wheezing. There is no history of allergies, aspirin allergies, CAD, chronic lung disease, a heart failure or PE.  Thyroid Problem Presents for follow-up visit. Symptoms include cold intolerance, diaphoresis, fatigue and palpitations. Patient reports no anxiety, constipation, depressed mood, diarrhea, dry skin, hair loss, hoarse voice, leg swelling, menstrual problem, nail problem, tremors, visual change, weight gain or weight loss. The symptoms have been worsening. Past treatments include nothing. The treatment provided no relief. There is no history of atrial fibrillation, dementia, Graves' ophthalmopathy, heart failure, hyperlipidemia, neuropathy, obesity or osteopenia. There are no known risk factors.    No problem-specific assessment &  plan notes found for this encounter.   Past Medical History  Diagnosis Date  . LABD (linear immunoglobulin A bullous dermatosis)   . Hypertension   . Mitral valve prolapse   . GERD (gastroesophageal reflux disease)   . Hyperlipidemia     Past Surgical History  Procedure Laterality Date  . Replacement total knee bilateral    . Tonsillectomy    . Appendectomy    . Abdominal hysterectomy      No family history on file.  Social History   Social History  . Marital Status: Married    Spouse Name: N/A  . Number of Children: N/A  . Years of Education: N/A   Occupational History  . Not on file.   Social History Main Topics  . Smoking status: Former Research scientist (life sciences)  . Smokeless tobacco: Not on file  . Alcohol Use: No  . Drug Use: No  . Sexual Activity: Not on file   Other Topics Concern  . Not on file   Social History Narrative    Allergies  Allergen Reactions  . Codeine Other (See Comments)    Pt reports only having symptoms after receiving large amounts of medication  . Entex Lq [Phenylephrine-Guaifenesin] Swelling  . Sulfa Antibiotics Itching     Review of Systems  Constitutional: Positive for diaphoresis and fatigue. Negative for chills, weight loss, weight gain and malaise/fatigue.  HENT: Negative for ear discharge, ear pain, hoarse voice and sore throat.   Eyes: Negative for blurred vision.  Respiratory: Positive for shortness of breath. Negative for cough, sputum production and wheezing.   Cardiovascular: Positive for palpitations. Negative for chest pain and leg swelling.  Gastrointestinal: Negative for heartburn, nausea, abdominal pain,  diarrhea, constipation, blood in stool and melena.  Genitourinary: Negative for dysuria, urgency, frequency, hematuria and menstrual problem.  Musculoskeletal: Negative for myalgias, back pain, joint pain and neck pain.  Skin: Negative for rash.  Neurological: Positive for dizziness. Negative for tremors, sensory change, focal  weakness and headaches.  Endo/Heme/Allergies: Positive for cold intolerance. Negative for environmental allergies and polydipsia. Does not bruise/bleed easily.  Psychiatric/Behavioral: Negative for depression and suicidal ideas. The patient is not nervous/anxious and does not have insomnia.      Objective  Filed Vitals:   10/22/15 0925  BP: 116/76  Pulse: 86  Temp: 98.2 F (36.8 C)  TempSrc: Oral  Resp: 16  Height: 5' (1.524 m)  Weight: 204 lb (92.534 kg)  SpO2: 96%    Physical Exam  Constitutional: She is well-developed, well-nourished, and in no distress. No distress.  HENT:  Head: Normocephalic and atraumatic.  Right Ear: External ear normal.  Left Ear: External ear normal.  Nose: Nose normal.  Mouth/Throat: Oropharynx is clear and moist.  Eyes: Conjunctivae and EOM are normal. Pupils are equal, round, and reactive to light. Right eye exhibits no discharge. Left eye exhibits no discharge.  Neck: Normal range of motion. Neck supple. No JVD present. No thyromegaly present.  Cardiovascular: Normal rate, regular rhythm, normal heart sounds and intact distal pulses.  Exam reveals no gallop and no friction rub.   No murmur heard. Pulmonary/Chest: Effort normal and breath sounds normal.  Abdominal: Soft. Bowel sounds are normal. She exhibits no mass. There is no tenderness. There is no guarding.  Musculoskeletal: Normal range of motion. She exhibits no edema.  Lymphadenopathy:    She has no cervical adenopathy.  Neurological: She is alert. She has normal reflexes.  Skin: Skin is warm and dry. She is not diaphoretic.  Psychiatric: Mood and affect normal.  Nursing note and vitals reviewed.     Assessment & Plan  Problem List Items Addressed This Visit    None    Visit Diagnoses    Near syncope    -  Primary    Relevant Orders    Ambulatory referral to Cardiology    Palpitations        Relevant Orders    EKG 12-Lead (Completed)    Ambulatory referral to Cardiology     Other fatigue        Vertigo        needs referral for balance and possible vertigo    Hypotension due to drugs             Dr. Maysel Mccolm [facility]  10/22/2015

## 2015-10-23 DIAGNOSIS — Z9181 History of falling: Secondary | ICD-10-CM | POA: Diagnosis not present

## 2015-10-23 DIAGNOSIS — I951 Orthostatic hypotension: Secondary | ICD-10-CM | POA: Diagnosis not present

## 2015-10-29 DIAGNOSIS — E782 Mixed hyperlipidemia: Secondary | ICD-10-CM | POA: Diagnosis not present

## 2015-10-29 DIAGNOSIS — R0602 Shortness of breath: Secondary | ICD-10-CM | POA: Diagnosis not present

## 2015-10-29 DIAGNOSIS — R55 Syncope and collapse: Secondary | ICD-10-CM | POA: Diagnosis not present

## 2015-10-30 ENCOUNTER — Ambulatory Visit (INDEPENDENT_AMBULATORY_CARE_PROVIDER_SITE_OTHER): Payer: Medicare HMO | Admitting: Family Medicine

## 2015-10-30 ENCOUNTER — Encounter: Payer: Self-pay | Admitting: Family Medicine

## 2015-10-30 VITALS — BP 150/70 | HR 64 | Ht 60.0 in | Wt 201.0 lb

## 2015-10-30 DIAGNOSIS — I10 Essential (primary) hypertension: Secondary | ICD-10-CM | POA: Diagnosis not present

## 2015-10-30 DIAGNOSIS — R55 Syncope and collapse: Secondary | ICD-10-CM | POA: Diagnosis not present

## 2015-10-30 DIAGNOSIS — M6281 Muscle weakness (generalized): Secondary | ICD-10-CM | POA: Diagnosis not present

## 2015-10-30 DIAGNOSIS — R42 Dizziness and giddiness: Secondary | ICD-10-CM | POA: Diagnosis not present

## 2015-10-30 DIAGNOSIS — H8111 Benign paroxysmal vertigo, right ear: Secondary | ICD-10-CM | POA: Diagnosis not present

## 2015-10-30 DIAGNOSIS — R262 Difficulty in walking, not elsewhere classified: Secondary | ICD-10-CM | POA: Diagnosis not present

## 2015-10-30 NOTE — Patient Instructions (Signed)

## 2015-10-30 NOTE — Progress Notes (Signed)
Name: Lindsay Stephenson   MRN: [id number]    DOB: 07-04-1945   Date:10/30/2015       Progress Note  Subjective  Chief Complaint  Chief Complaint  Patient presents with  . Hypertension    was taken off B/P med x 1 week ago- Dr Nehemiah Massed is going to put holter on as well as doing a stress test    Hypertension  This is a chronic problem. The current episode started more than 1 year ago. The problem is uncontrolled. Pertinent negatives include no anxiety, blurred vision, chest pain, headaches, malaise/fatigue, neck pain, orthopnea, palpitations, peripheral edema, PND, shortness of breath or sweats. Associated agents: heat exacerbated  Risk factors for coronary artery disease include stress. Past treatments include nothing (recently stop). There are no compliance problems.  There is no history of angina, kidney disease, CAD/MI, CVA, heart failure, left ventricular hypertrophy, PVD, renovascular disease or retinopathy. There is no history of chronic renal disease or a hypertension causing med.    No problem-specific Assessment & Plan notes found for this encounter.   Past Medical History:  Diagnosis Date  . Allergy   . Anemia   . GERD (gastroesophageal reflux disease)   . Hyperlipidemia   . Hypertension   . LABD (linear immunoglobulin A bullous dermatosis)   . Mitral valve prolapse     Past Surgical History:  Procedure Laterality Date  . ABDOMINAL HYSTERECTOMY    . APPENDECTOMY    . REPLACEMENT TOTAL KNEE BILATERAL    . TONSILLECTOMY      History reviewed. No pertinent family history.  Social History   Social History  . Marital status: Married    Spouse name: N/A  . Number of children: N/A  . Years of education: N/A   Occupational History  . Not on file.   Social History Main Topics  . Smoking status: Former Research scientist (life sciences)  . Smokeless tobacco: Never Used  . Alcohol use No  . Drug use: No  . Sexual activity: Not on file   Other Topics Concern  . Not on file   Social History  Narrative  . No narrative on file    Allergies  Allergen Reactions  . Codeine Other (See Comments)    Pt reports only having symptoms after receiving large amounts of medication  . Entex Lq [Phenylephrine-Guaifenesin] Swelling  . Sulfa Antibiotics Itching     Review of Systems  Constitutional: Negative for chills, fever, malaise/fatigue and weight loss.  HENT: Negative for ear discharge, ear pain and sore throat.   Eyes: Negative for blurred vision.  Respiratory: Negative for cough, sputum production, shortness of breath and wheezing.   Cardiovascular: Negative for chest pain, palpitations, orthopnea, leg swelling and PND.  Gastrointestinal: Negative for abdominal pain, blood in stool, constipation, diarrhea, heartburn, melena and nausea.  Genitourinary: Negative for dysuria, frequency, hematuria and urgency.  Musculoskeletal: Negative for back pain, joint pain, myalgias and neck pain.  Skin: Negative for rash.  Neurological: Negative for dizziness, tingling, sensory change, focal weakness and headaches.  Endo/Heme/Allergies: Negative for environmental allergies and polydipsia. Does not bruise/bleed easily.  Psychiatric/Behavioral: Negative for depression and suicidal ideas. The patient is not nervous/anxious and does not have insomnia.      Objective  Vitals:   10/30/15 0810  BP: (!) 150/70  Pulse: 64  Weight: 201 lb (91.2 kg)  Height: 5' (1.524 m)    Physical Exam  Constitutional: She is well-developed, well-nourished, and in no distress. No distress.  HENT:  Head:  Normocephalic and atraumatic.  Right Ear: External ear normal.  Left Ear: External ear normal.  Nose: Nose normal.  Mouth/Throat: Oropharynx is clear and moist.  Eyes: Conjunctivae and EOM are normal. Pupils are equal, round, and reactive to light. Right eye exhibits no discharge. Left eye exhibits no discharge.  Neck: Normal range of motion. Neck supple. No JVD present. No thyromegaly present.   Cardiovascular: Normal rate, regular rhythm, normal heart sounds and intact distal pulses.  Exam reveals no gallop and no friction rub.   No murmur heard. Pulmonary/Chest: Effort normal and breath sounds normal.  Abdominal: Soft. Bowel sounds are normal. She exhibits no mass. There is no tenderness. There is no guarding.  Musculoskeletal: Normal range of motion. She exhibits no edema.  Lymphadenopathy:    She has no cervical adenopathy.  Neurological: She is alert. She has normal reflexes.  Skin: Skin is warm and dry. She is not diaphoretic.  Psychiatric: Mood and affect normal.  Nursing note and vitals reviewed.     Assessment & Plan  Problem List Items Addressed This Visit      Cardiovascular and Mediastinum   Essential hypertension - Primary    Other Visit Diagnoses    Vertigo       Relevant Orders   Ambulatory referral to Physical Therapy        Dr. Otilio Miu [facility]  10/30/15

## 2015-11-04 DIAGNOSIS — H8111 Benign paroxysmal vertigo, right ear: Secondary | ICD-10-CM | POA: Diagnosis not present

## 2015-11-04 DIAGNOSIS — R262 Difficulty in walking, not elsewhere classified: Secondary | ICD-10-CM | POA: Diagnosis not present

## 2015-11-04 DIAGNOSIS — M6281 Muscle weakness (generalized): Secondary | ICD-10-CM | POA: Diagnosis not present

## 2015-11-06 DIAGNOSIS — H8111 Benign paroxysmal vertigo, right ear: Secondary | ICD-10-CM | POA: Diagnosis not present

## 2015-11-06 DIAGNOSIS — R262 Difficulty in walking, not elsewhere classified: Secondary | ICD-10-CM | POA: Diagnosis not present

## 2015-11-06 DIAGNOSIS — M6281 Muscle weakness (generalized): Secondary | ICD-10-CM | POA: Diagnosis not present

## 2015-11-06 DIAGNOSIS — M76821 Posterior tibial tendinitis, right leg: Secondary | ICD-10-CM | POA: Diagnosis not present

## 2015-11-06 DIAGNOSIS — M76822 Posterior tibial tendinitis, left leg: Secondary | ICD-10-CM | POA: Diagnosis not present

## 2015-11-07 DIAGNOSIS — R55 Syncope and collapse: Secondary | ICD-10-CM | POA: Diagnosis not present

## 2015-11-10 ENCOUNTER — Ambulatory Visit (INDEPENDENT_AMBULATORY_CARE_PROVIDER_SITE_OTHER): Payer: Medicare HMO | Admitting: Family Medicine

## 2015-11-10 ENCOUNTER — Encounter: Payer: Self-pay | Admitting: Family Medicine

## 2015-11-10 VITALS — BP 100/62 | HR 80 | Ht 60.0 in | Wt 203.0 lb

## 2015-11-10 DIAGNOSIS — N3001 Acute cystitis with hematuria: Secondary | ICD-10-CM | POA: Diagnosis not present

## 2015-11-10 LAB — POCT URINALYSIS DIPSTICK
Bilirubin, UA: NEGATIVE
GLUCOSE UA: NEGATIVE
Ketones, UA: NEGATIVE
NITRITE UA: NEGATIVE
PH UA: 5
SPEC GRAV UA: 1.02
UROBILINOGEN UA: 0.2

## 2015-11-10 MED ORDER — CIPROFLOXACIN HCL 250 MG PO TABS: 250.0000 mg | ORAL_TABLET | Freq: Two times a day (BID) | ORAL | 0 refills | Status: DC

## 2015-11-10 NOTE — Progress Notes (Signed)
Name: Lindsay Stephenson   MRN: [id number]    DOB: March 01, 1946   Date:11/10/2015       Progress Note  Subjective  Chief Complaint  Chief Complaint  Patient presents with  . Urinary Tract Infection    burning when urinates and has to "go, go, go"    Urinary Tract Infection   This is a new problem. The current episode started in the past 7 days. The problem occurs intermittently. The problem has been gradually worsening. The quality of the pain is described as burning. The pain is mild. There has been no fever. Associated symptoms include frequency and urgency. Pertinent negatives include no chills, discharge, flank pain, hematuria, hesitancy, nausea, sweats or vomiting. She has tried nothing for the symptoms. The treatment provided no relief.    No problem-specific Assessment & Plan notes found for this encounter.   Past Medical History:  Diagnosis Date  . Allergy   . Anemia   . GERD (gastroesophageal reflux disease)   . Hyperlipidemia   . Hypertension   . LABD (linear immunoglobulin A bullous dermatosis)   . Mitral valve prolapse     Past Surgical History:  Procedure Laterality Date  . ABDOMINAL HYSTERECTOMY    . APPENDECTOMY    . REPLACEMENT TOTAL KNEE BILATERAL    . TONSILLECTOMY      History reviewed. No pertinent family history.  Social History   Social History  . Marital status: Married    Spouse name: N/A  . Number of children: N/A  . Years of education: N/A   Occupational History  . Not on file.   Social History Main Topics  . Smoking status: Former Research scientist (life sciences)  . Smokeless tobacco: Never Used  . Alcohol use No  . Drug use: No  . Sexual activity: Not Currently   Other Topics Concern  . Not on file   Social History Narrative  . No narrative on file    Allergies  Allergen Reactions  . Codeine Other (See Comments)    Pt reports only having symptoms after receiving large amounts of medication  . Entex Lq [Phenylephrine-Guaifenesin] Swelling  . Sulfa  Antibiotics Itching     Review of Systems  Constitutional: Negative for chills, fever, malaise/fatigue and weight loss.  HENT: Negative for ear discharge, ear pain and sore throat.   Eyes: Negative for blurred vision.  Respiratory: Negative for cough, sputum production, shortness of breath and wheezing.   Cardiovascular: Negative for chest pain, palpitations and leg swelling.  Gastrointestinal: Negative for abdominal pain, blood in stool, constipation, diarrhea, heartburn, melena, nausea and vomiting.  Genitourinary: Positive for frequency and urgency. Negative for dysuria, flank pain, hematuria and hesitancy.  Musculoskeletal: Negative for back pain, joint pain, myalgias and neck pain.  Skin: Negative for rash.  Neurological: Negative for dizziness, tingling, sensory change, focal weakness and headaches.  Endo/Heme/Allergies: Negative for environmental allergies and polydipsia. Does not bruise/bleed easily.  Psychiatric/Behavioral: Negative for depression and suicidal ideas. The patient is not nervous/anxious and does not have insomnia.      Objective  Vitals:   11/10/15 1604  BP: 100/62  Pulse: 80  Weight: 203 lb (92.1 kg)  Height: 5' (1.524 m)    Physical Exam  Constitutional: She is well-developed, well-nourished, and in no distress. No distress.  HENT:  Head: Normocephalic and atraumatic.  Right Ear: External ear normal.  Left Ear: External ear normal.  Nose: Nose normal.  Mouth/Throat: Oropharynx is clear and moist.  Eyes: Conjunctivae and EOM are  normal. Pupils are equal, round, and reactive to light. Right eye exhibits no discharge. Left eye exhibits no discharge.  Neck: Normal range of motion. Neck supple. No JVD present. No thyromegaly present.  Cardiovascular: Normal rate, regular rhythm, normal heart sounds and intact distal pulses.  Exam reveals no gallop and no friction rub.   No murmur heard. Pulmonary/Chest: Effort normal and breath sounds normal.   Abdominal: Soft. Bowel sounds are normal. She exhibits no mass. There is no tenderness. There is no guarding.  Musculoskeletal: Normal range of motion. She exhibits no edema.  Lymphadenopathy:    She has no cervical adenopathy.  Neurological: She is alert. She has normal reflexes.  Skin: Skin is warm and dry. She is not diaphoretic.  Psychiatric: Mood and affect normal.  Nursing note and vitals reviewed.     Assessment & Plan  Problem List Items Addressed This Visit    None    Visit Diagnoses    Acute cystitis with hematuria    -  Primary   Relevant Orders   POCT Urinalysis Dipstick (Completed)        Dr. Otilio Miu [facility]  11/10/15

## 2015-11-13 DIAGNOSIS — M6281 Muscle weakness (generalized): Secondary | ICD-10-CM | POA: Diagnosis not present

## 2015-11-13 DIAGNOSIS — R262 Difficulty in walking, not elsewhere classified: Secondary | ICD-10-CM | POA: Diagnosis not present

## 2015-11-13 DIAGNOSIS — H8111 Benign paroxysmal vertigo, right ear: Secondary | ICD-10-CM | POA: Diagnosis not present

## 2015-11-18 DIAGNOSIS — I471 Supraventricular tachycardia: Secondary | ICD-10-CM | POA: Diagnosis not present

## 2015-11-18 DIAGNOSIS — I951 Orthostatic hypotension: Secondary | ICD-10-CM | POA: Diagnosis not present

## 2015-11-18 DIAGNOSIS — R0602 Shortness of breath: Secondary | ICD-10-CM | POA: Diagnosis not present

## 2015-11-18 DIAGNOSIS — R55 Syncope and collapse: Secondary | ICD-10-CM | POA: Diagnosis not present

## 2015-11-20 DIAGNOSIS — H8111 Benign paroxysmal vertigo, right ear: Secondary | ICD-10-CM | POA: Diagnosis not present

## 2015-11-20 DIAGNOSIS — M6281 Muscle weakness (generalized): Secondary | ICD-10-CM | POA: Diagnosis not present

## 2015-11-20 DIAGNOSIS — R262 Difficulty in walking, not elsewhere classified: Secondary | ICD-10-CM | POA: Diagnosis not present

## 2015-11-24 DIAGNOSIS — M6281 Muscle weakness (generalized): Secondary | ICD-10-CM | POA: Diagnosis not present

## 2015-11-24 DIAGNOSIS — R262 Difficulty in walking, not elsewhere classified: Secondary | ICD-10-CM | POA: Diagnosis not present

## 2015-11-24 DIAGNOSIS — H8111 Benign paroxysmal vertigo, right ear: Secondary | ICD-10-CM | POA: Diagnosis not present

## 2015-11-27 DIAGNOSIS — H8111 Benign paroxysmal vertigo, right ear: Secondary | ICD-10-CM | POA: Diagnosis not present

## 2015-11-27 DIAGNOSIS — R262 Difficulty in walking, not elsewhere classified: Secondary | ICD-10-CM | POA: Diagnosis not present

## 2015-11-27 DIAGNOSIS — M6281 Muscle weakness (generalized): Secondary | ICD-10-CM | POA: Diagnosis not present

## 2015-12-04 DIAGNOSIS — G4733 Obstructive sleep apnea (adult) (pediatric): Secondary | ICD-10-CM | POA: Diagnosis not present

## 2015-12-04 DIAGNOSIS — R262 Difficulty in walking, not elsewhere classified: Secondary | ICD-10-CM | POA: Diagnosis not present

## 2015-12-04 DIAGNOSIS — H8111 Benign paroxysmal vertigo, right ear: Secondary | ICD-10-CM | POA: Diagnosis not present

## 2015-12-04 DIAGNOSIS — M6281 Muscle weakness (generalized): Secondary | ICD-10-CM | POA: Diagnosis not present

## 2015-12-09 DIAGNOSIS — R262 Difficulty in walking, not elsewhere classified: Secondary | ICD-10-CM | POA: Diagnosis not present

## 2015-12-09 DIAGNOSIS — M6281 Muscle weakness (generalized): Secondary | ICD-10-CM | POA: Diagnosis not present

## 2015-12-09 DIAGNOSIS — H8111 Benign paroxysmal vertigo, right ear: Secondary | ICD-10-CM | POA: Diagnosis not present

## 2015-12-11 DIAGNOSIS — M6281 Muscle weakness (generalized): Secondary | ICD-10-CM | POA: Diagnosis not present

## 2015-12-11 DIAGNOSIS — H8111 Benign paroxysmal vertigo, right ear: Secondary | ICD-10-CM | POA: Diagnosis not present

## 2015-12-11 DIAGNOSIS — R262 Difficulty in walking, not elsewhere classified: Secondary | ICD-10-CM | POA: Diagnosis not present

## 2015-12-15 DIAGNOSIS — R262 Difficulty in walking, not elsewhere classified: Secondary | ICD-10-CM | POA: Diagnosis not present

## 2015-12-15 DIAGNOSIS — M6281 Muscle weakness (generalized): Secondary | ICD-10-CM | POA: Diagnosis not present

## 2015-12-15 DIAGNOSIS — H8111 Benign paroxysmal vertigo, right ear: Secondary | ICD-10-CM | POA: Diagnosis not present

## 2015-12-18 DIAGNOSIS — R262 Difficulty in walking, not elsewhere classified: Secondary | ICD-10-CM | POA: Diagnosis not present

## 2015-12-18 DIAGNOSIS — H8111 Benign paroxysmal vertigo, right ear: Secondary | ICD-10-CM | POA: Diagnosis not present

## 2015-12-18 DIAGNOSIS — M6281 Muscle weakness (generalized): Secondary | ICD-10-CM | POA: Diagnosis not present

## 2015-12-25 DIAGNOSIS — R262 Difficulty in walking, not elsewhere classified: Secondary | ICD-10-CM | POA: Diagnosis not present

## 2015-12-25 DIAGNOSIS — H8111 Benign paroxysmal vertigo, right ear: Secondary | ICD-10-CM | POA: Diagnosis not present

## 2015-12-25 DIAGNOSIS — M6281 Muscle weakness (generalized): Secondary | ICD-10-CM | POA: Diagnosis not present

## 2015-12-29 DIAGNOSIS — H8111 Benign paroxysmal vertigo, right ear: Secondary | ICD-10-CM | POA: Diagnosis not present

## 2015-12-29 DIAGNOSIS — M6281 Muscle weakness (generalized): Secondary | ICD-10-CM | POA: Diagnosis not present

## 2015-12-29 DIAGNOSIS — R262 Difficulty in walking, not elsewhere classified: Secondary | ICD-10-CM | POA: Diagnosis not present

## 2016-01-01 DIAGNOSIS — R262 Difficulty in walking, not elsewhere classified: Secondary | ICD-10-CM | POA: Diagnosis not present

## 2016-01-01 DIAGNOSIS — M6281 Muscle weakness (generalized): Secondary | ICD-10-CM | POA: Diagnosis not present

## 2016-01-01 DIAGNOSIS — H8111 Benign paroxysmal vertigo, right ear: Secondary | ICD-10-CM | POA: Diagnosis not present

## 2016-01-02 ENCOUNTER — Encounter: Payer: Self-pay | Admitting: Family Medicine

## 2016-01-02 ENCOUNTER — Other Ambulatory Visit
Admission: RE | Admit: 2016-01-02 | Discharge: 2016-01-02 | Disposition: A | Discharge status: Home / Self Care | Payer: Medicare HMO | Source: Ambulatory Visit | Attending: Family Medicine | Admitting: Family Medicine

## 2016-01-02 ENCOUNTER — Ambulatory Visit (INDEPENDENT_AMBULATORY_CARE_PROVIDER_SITE_OTHER): Payer: Medicare HMO | Admitting: Family Medicine

## 2016-01-02 VITALS — BP 120/62 | HR 100 | Ht 60.0 in | Wt 201.0 lb

## 2016-01-02 DIAGNOSIS — Z23 Encounter for immunization: Secondary | ICD-10-CM

## 2016-01-02 DIAGNOSIS — R197 Diarrhea, unspecified: Secondary | ICD-10-CM

## 2016-01-02 DIAGNOSIS — K529 Noninfective gastroenteritis and colitis, unspecified: Secondary | ICD-10-CM | POA: Diagnosis not present

## 2016-01-02 DIAGNOSIS — D649 Anemia, unspecified: Secondary | ICD-10-CM | POA: Diagnosis not present

## 2016-01-02 DIAGNOSIS — R159 Full incontinence of feces: Secondary | ICD-10-CM

## 2016-01-02 LAB — CBC WITH DIFFERENTIAL/PLATELET
BASOS PCT: 1 %
Basophils Absolute: 0.1 10*3/uL (ref 0–0.1)
EOS ABS: 0.4 10*3/uL (ref 0–0.7)
EOS PCT: 4 %
HCT: 33.9 % — ABNORMAL LOW (ref 35.0–47.0)
HEMOGLOBIN: 11.3 g/dL — AB (ref 12.0–16.0)
Lymphocytes Relative: 19 %
Lymphs Abs: 2 10*3/uL (ref 1.0–3.6)
MCH: 33.8 pg (ref 26.0–34.0)
MCHC: 33.4 g/dL (ref 32.0–36.0)
MCV: 100.9 fL — ABNORMAL HIGH (ref 80.0–100.0)
MONOS PCT: 7 %
Monocytes Absolute: 0.7 10*3/uL (ref 0.2–0.9)
NEUTROS PCT: 69 %
Neutro Abs: 7.3 10*3/uL — ABNORMAL HIGH (ref 1.4–6.5)
PLATELETS: 194 10*3/uL (ref 150–440)
RBC: 3.36 MIL/uL — AB (ref 3.80–5.20)
RDW: 13.1 % (ref 11.5–14.5)
WBC: 10.4 10*3/uL (ref 3.6–11.0)

## 2016-01-02 MED ORDER — AMOXICILLIN-POT CLAVULANATE 875-125 MG PO TABS: 1.0000 | ORAL_TABLET | Freq: Two times a day (BID) | ORAL | 0 refills | Status: DC

## 2016-01-02 MED ORDER — METRONIDAZOLE 500 MG PO TABS: 500.0000 mg | ORAL_TABLET | Freq: Two times a day (BID) | ORAL | 0 refills | Status: DC

## 2016-01-02 NOTE — Progress Notes (Signed)
Name: Lindsay Stephenson   MRN: [id number]    DOB: 1945/06/28   Date:01/02/2016       Progress Note  Subjective  Chief Complaint  Chief Complaint  Patient presents with  . Rectal Bleeding    blood in stools and bright red when wipes. 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 The onset was sudden. The problem occurs frequently (4-5/day). The problem has been gradually worsening. The pain is moderate. The stool is described as liquid, mixed with blood and soft (mucous). Ineffective treatments: on dapsone for LABD. Associated symptoms include abdominal pain, diarrhea and rash. Pertinent negatives include no anorexia, no fever, no hematemesis, no nausea, no rectal pain, no vomiting, no hematuria, no vaginal bleeding, no vaginal discharge, no chest pain, no headaches, no coughing and no difficulty breathing. Past medical history comments: LABD/iron deficient anemia/ Hx of diverticula. Recently, medical care has been given by a specialist (dermatology on dapsone).    No problem-specific Assessment & Plan notes found for this encounter.   Past Medical History:  Diagnosis Date  . Allergy   . Anemia   . GERD (gastroesophageal reflux disease)   . Hyperlipidemia   . Hypertension   . LABD (linear immunoglobulin A bullous dermatosis)   . Mitral valve prolapse     Past Surgical History:  Procedure Laterality Date  . ABDOMINAL HYSTERECTOMY    . APPENDECTOMY    . REPLACEMENT TOTAL KNEE BILATERAL    . TONSILLECTOMY      History reviewed. No pertinent family history.  Social History   Social History  . Marital status: Married    Spouse name: N/A  . Number of children: N/A  . Years of  education: N/A   Occupational History  . Not on file.   Social History Main Topics  . Smoking status: Former Research scientist (life sciences)  . Smokeless tobacco: Never Used  . Alcohol use No  . Drug use: No  . Sexual activity: Not Currently   Other Topics Concern  . Not on file   Social History Narrative  . No narrative on file    Allergies  Allergen Reactions  . Codeine Other (See Comments)    Pt reports only having symptoms after receiving large amounts of medication  . Entex Lq [Phenylephrine-Guaifenesin] Swelling  . Sulfa Antibiotics Itching     Review of Systems  Constitutional: Negative for chills, fever, malaise/fatigue and weight loss.  HENT: Negative for ear discharge, ear pain and sore throat.   Eyes: Negative for blurred vision.  Respiratory: Negative for cough, sputum production, shortness of breath and wheezing.   Cardiovascular: Negative for chest pain, palpitations and leg swelling.  Gastrointestinal: Positive for abdominal pain, diarrhea and hematochezia. Negative for anorexia, blood in stool, constipation, heartburn, hematemesis, melena, nausea, rectal pain and vomiting.  Genitourinary: Negative for dysuria, frequency, hematuria, urgency, vaginal bleeding and vaginal discharge.  Musculoskeletal: Negative for back pain, joint pain, myalgias and neck pain.  Skin: Positive for rash.  Neurological: Negative for dizziness, tingling, sensory change, focal weakness and headaches.  Endo/Heme/Allergies: Negative for environmental allergies and polydipsia. Does not bruise/bleed easily.  Psychiatric/Behavioral: Negative for depression and suicidal ideas. The patient is not nervous/anxious and  does not have insomnia.      Objective  Vitals:   01/02/16 1338  BP: 120/62  Pulse: 100  Weight: 201 lb (91.2 kg)  Height: 5' (1.524 m)    Physical Exam  Constitutional: She is well-developed, well-nourished, and in no distress. No distress.  HENT:  Head: Normocephalic and atraumatic.   Right Ear: External ear normal.  Left Ear: External ear normal.  Nose: Nose normal.  Mouth/Throat: Oropharynx is clear and moist.  Eyes: Conjunctivae and EOM are normal. Pupils are equal, round, and reactive to light. Right eye exhibits no discharge. Left eye exhibits no discharge.  Neck: Normal range of motion. Neck supple. No JVD present. No thyromegaly present.  Cardiovascular: Normal rate, regular rhythm, normal heart sounds and intact distal pulses.  Exam reveals no gallop and no friction rub.   No murmur heard. Pulmonary/Chest: Effort normal and breath sounds normal.  Abdominal: Soft. Bowel sounds are normal. She exhibits no mass. There is no tenderness. There is no guarding.  Musculoskeletal: Normal range of motion. She exhibits no edema.  Lymphadenopathy:    She has no cervical adenopathy.  Neurological: She is alert. She has normal reflexes.  Skin: Skin is warm and dry. She is not diaphoretic.  Psychiatric: Mood and affect normal.  Nursing note and vitals reviewed.     Assessment & Plan  Problem List Items Addressed This Visit    None    Visit Diagnoses    Flu vaccine need    -  Primary   Relevant Orders   Flu Vaccine QUAD 36+ mos PF IM (Fluarix & Fluzone Quad PF) (Completed)   Diarrhea in adult patient       Relevant Medications   amoxicillin-clavulanate (AUGMENTIN) 875-125 MG tablet   metroNIDAZOLE (FLAGYL) 500 MG tablet   Other Relevant Orders   Ambulatory referral to Gastroenterology   Fecal incontinence       Relevant Orders   Ambulatory referral to Gastroenterology        Dr. Otilio Miu [facility]  01/02/16

## 2016-01-05 ENCOUNTER — Other Ambulatory Visit: Payer: Self-pay

## 2016-01-05 ENCOUNTER — Ambulatory Visit (INDEPENDENT_AMBULATORY_CARE_PROVIDER_SITE_OTHER): Payer: Medicare HMO | Admitting: Gastroenterology

## 2016-01-05 ENCOUNTER — Encounter: Payer: Self-pay | Admitting: Gastroenterology

## 2016-01-05 VITALS — BP 140/69 | HR 108 | Temp 98.3°F | Ht 60.0 in | Wt 206.0 lb

## 2016-01-05 DIAGNOSIS — K921 Melena: Secondary | ICD-10-CM | POA: Diagnosis not present

## 2016-01-05 DIAGNOSIS — R197 Diarrhea, unspecified: Secondary | ICD-10-CM | POA: Diagnosis not present

## 2016-01-05 MED ORDER — AMOXICILLIN-POT CLAVULANATE 875-125 MG PO TABS: 1.0000 | ORAL_TABLET | Freq: Two times a day (BID) | ORAL | 0 refills | Status: DC

## 2016-01-05 MED ORDER — METRONIDAZOLE 500 MG PO TABS
500.0000 mg | ORAL_TABLET | Freq: Two times a day (BID) | ORAL | 0 refills | Status: DC
Start: 2016-01-05 — End: 2016-04-02

## 2016-01-05 MED ORDER — ALBUTEROL SULFATE HFA 108 (90 BASE) MCG/ACT IN AERS: 1.0000 | INHALATION_SPRAY | Freq: Four times a day (QID) | RESPIRATORY_TRACT | 1 refills | Status: AC | PRN

## 2016-01-05 NOTE — Progress Notes (Signed)
Gastroenterology Consultation  Referring Provider:     No ref. provider found Primary Care Physician:  Otilio Miu, MD Primary Gastroenterologist:  Dr. Allen Norris     Reason for Consultation:     Hematochezia        HPI:   Lindsay Stephenson is a 70 y.o. y/o female referred for consultation & management of Hematochezia by Dr. Otilio Miu, MD.  This patient comes in today with a report of having rectal bleeding with mucus per rectum. The patient reports that she thinks she may have had diverticulitis about 20 years ago but is not sure about a. The patient states that she has abdominal pain in the lower abdomen but only when she eats. She denies any fevers or chills. The patient did have some blood work that showed her to have a normal white cell count with a slightly low hemoglobin. There is no report of any nausea or vomiting and she thinks she has only lost approximate 5 pounds because she doesn't feel like she wants to eat. The patient also reports that she had a colonoscopy many years ago but nothing recently. She states that she will have some dark blood but when she wipes she starts to have some bright red blood. She also reports that she has a history of chronic constipation but now her stools have been much softer and she's been having diarrhea.  Past Medical History:  Diagnosis Date  . Allergy   . Anemia   . GERD (gastroesophageal reflux disease)   . Hyperlipidemia   . Hypertension   . LABD (linear immunoglobulin A bullous dermatosis)   . Mitral valve prolapse     Past Surgical History:  Procedure Laterality Date  . ABDOMINAL HYSTERECTOMY    . APPENDECTOMY    . REPLACEMENT TOTAL KNEE BILATERAL    . TONSILLECTOMY      Prior to Admission medications   Medication Sig Start Date End Date Taking? Authorizing Provider  albuterol (PROVENTIL HFA;VENTOLIN HFA) 108 (90 Base) MCG/ACT inhaler Inhale 1-2 puffs into the lungs every 6 (six) hours as needed for wheezing or shortness of breath.  01/05/16  Yes Juline Patch, MD  dapsone 100 MG tablet 1 tablet daily. Derm 08/16/15  Yes Historical Provider, MD  docusate sodium (COLACE) 100 MG capsule Take 300 mg by mouth every other day.    Yes Historical Provider, MD  ferrous sulfate 325 (65 FE) MG tablet Take 325 mg by mouth every other day.    Yes Historical Provider, MD  folic acid (FOLVITE) 1 MG tablet Take 1 mg by mouth daily.   Yes Historical Provider, MD  Magnesium 250 MG TABS Take 2 tablets by mouth 2 (two) times a week.    Yes Historical Provider, MD  Multiple Vitamin (MULTIVITAMIN) capsule Take 1 capsule by mouth daily.   Yes Historical Provider, MD  omeprazole (PRILOSEC) 20 MG capsule Take 1 capsule (20 mg total) by mouth daily. 10/01/15  Yes Juline Patch, MD  pravastatin (PRAVACHOL) 80 MG tablet Take 1 tablet (80 mg total) by mouth daily. 10/01/15  Yes Juline Patch, MD  amoxicillin-clavulanate (AUGMENTIN) 875-125 MG tablet Take 1 tablet by mouth 2 (two) times daily. Patient not taking: Reported on 01/05/2016 01/05/16   Juline Patch, MD  metroNIDAZOLE (FLAGYL) 500 MG tablet Take 1 tablet (500 mg total) by mouth 2 (two) times daily. Patient not taking: Reported on 01/05/2016 01/05/16   Juline Patch, MD    History reviewed.  No pertinent family history.   Social History  Substance Use Topics  . Smoking status: Former Research scientist (life sciences)  . Smokeless tobacco: Never Used  . Alcohol use No    Allergies as of 01/05/2016 - Review Complete 01/05/2016  Allergen Reaction Noted  . Codeine Other (See Comments) 08/18/2015  . Entex lq [phenylephrine-guaifenesin] Swelling 08/18/2015  . Sulfa antibiotics Itching 08/18/2015    Review of Systems:    All systems reviewed and negative except where noted in HPI.   Physical Exam:  BP 140/69   Pulse (!) 108   Temp 98.3 F (36.8 C) (Oral)   Ht 5' (1.524 m)   Wt 206 lb (93.4 kg)   BMI 40.23 kg/m  No LMP recorded. Patient has had a hysterectomy. Psych:  Alert and cooperative. Normal mood and  affect. General:   Alert,  Well-developed, well-nourished, pleasant and cooperative in NAD Head:  Normocephalic and atraumatic. Eyes:  Sclera clear, no icterus.   Conjunctiva pink. Ears:  Normal auditory acuity. Nose:  No deformity, discharge, or lesions. Mouth:  No deformity or lesions,oropharynx pink & moist. Neck:  Supple; no masses or thyromegaly. Lungs:  Respirations even and unlabored.  Clear throughout to auscultation.   No wheezes, crackles, or rhonchi. No acute distress. Heart:  Regular rate and rhythm; no murmurs, clicks, rubs, or gallops. Abdomen:  Normal bowel sounds.  No bruits.  Soft, non-tender and non-distended without masses, hepatosplenomegaly or hernias noted.  No guarding or rebound tenderness.  Negative Carnett sign.   Rectal:  Deferred.  Msk:  Symmetrical without gross deformities.  Good, equal movement & strength bilaterally. Pulses:  Normal pulses noted. Extremities:  No clubbing or edema.  No cyanosis. Neurologic:  Alert and oriented x3;  grossly normal neurologically. Skin:  Intact without significant lesions or rashes.  No jaundice. Lymph Nodes:  No significant cervical adenopathy. Psych:  Alert and cooperative. Normal mood and affect.  Imaging Studies: No results found.  Assessment and Plan:   Lindsay Stephenson is a 75 y.o. y/o female who comes in today with lower abdominal pain but only when she eats. She also reports that she's had some rectal bleeding and diarrhea. There is no fevers or chills or an elevated white cell count on her recent blood work. This is unlikely diverticulitis. The patient will be set up for colonoscopy due to her abdominal pain with rectal bleeding. The patient has been explained the plan and agrees with it.   Note: This dictation was prepared with Dragon dictation along with smaller phrase technology. Any transcriptional errors that result from this process are unintentional.

## 2016-01-07 ENCOUNTER — Other Ambulatory Visit: Payer: Self-pay

## 2016-01-08 DIAGNOSIS — H8111 Benign paroxysmal vertigo, right ear: Secondary | ICD-10-CM | POA: Diagnosis not present

## 2016-01-08 DIAGNOSIS — M6281 Muscle weakness (generalized): Secondary | ICD-10-CM | POA: Diagnosis not present

## 2016-01-08 DIAGNOSIS — R262 Difficulty in walking, not elsewhere classified: Secondary | ICD-10-CM | POA: Diagnosis not present

## 2016-01-13 DIAGNOSIS — I471 Supraventricular tachycardia: Secondary | ICD-10-CM | POA: Diagnosis not present

## 2016-01-13 DIAGNOSIS — E782 Mixed hyperlipidemia: Secondary | ICD-10-CM | POA: Diagnosis not present

## 2016-01-13 DIAGNOSIS — I951 Orthostatic hypotension: Secondary | ICD-10-CM | POA: Diagnosis not present

## 2016-01-14 NOTE — Discharge Instructions (Signed)

## 2016-01-16 ENCOUNTER — Ambulatory Visit: Payer: Medicare HMO | Admitting: Student in an Organized Health Care Education/Training Program

## 2016-01-16 ENCOUNTER — Encounter
Admission: RE | Disposition: A | Discharge status: Home / Self Care | Payer: Self-pay | Source: Ambulatory Visit | Attending: Gastroenterology

## 2016-01-16 ENCOUNTER — Ambulatory Visit
Admission: RE | Admit: 2016-01-16 | Discharge: 2016-01-16 | Disposition: A | Discharge status: Home / Self Care | Payer: Medicare HMO | Source: Ambulatory Visit | Attending: Gastroenterology | Admitting: Gastroenterology

## 2016-01-16 DIAGNOSIS — K529 Noninfective gastroenteritis and colitis, unspecified: Secondary | ICD-10-CM | POA: Diagnosis not present

## 2016-01-16 DIAGNOSIS — E785 Hyperlipidemia, unspecified: Secondary | ICD-10-CM | POA: Diagnosis not present

## 2016-01-16 DIAGNOSIS — D123 Benign neoplasm of transverse colon: Secondary | ICD-10-CM | POA: Diagnosis not present

## 2016-01-16 DIAGNOSIS — I341 Nonrheumatic mitral (valve) prolapse: Secondary | ICD-10-CM | POA: Insufficient documentation

## 2016-01-16 DIAGNOSIS — Z882 Allergy status to sulfonamides status: Secondary | ICD-10-CM | POA: Insufficient documentation

## 2016-01-16 DIAGNOSIS — L139 Bullous disorder, unspecified: Secondary | ICD-10-CM | POA: Diagnosis not present

## 2016-01-16 DIAGNOSIS — K921 Melena: Secondary | ICD-10-CM | POA: Insufficient documentation

## 2016-01-16 DIAGNOSIS — Z96653 Presence of artificial knee joint, bilateral: Secondary | ICD-10-CM | POA: Insufficient documentation

## 2016-01-16 DIAGNOSIS — K5730 Diverticulosis of large intestine without perforation or abscess without bleeding: Secondary | ICD-10-CM | POA: Insufficient documentation

## 2016-01-16 DIAGNOSIS — K5289 Other specified noninfective gastroenteritis and colitis: Secondary | ICD-10-CM | POA: Diagnosis not present

## 2016-01-16 DIAGNOSIS — K635 Polyp of colon: Secondary | ICD-10-CM | POA: Diagnosis not present

## 2016-01-16 DIAGNOSIS — D649 Anemia, unspecified: Secondary | ICD-10-CM | POA: Diagnosis not present

## 2016-01-16 DIAGNOSIS — K219 Gastro-esophageal reflux disease without esophagitis: Secondary | ICD-10-CM | POA: Insufficient documentation

## 2016-01-16 DIAGNOSIS — H9190 Unspecified hearing loss, unspecified ear: Secondary | ICD-10-CM | POA: Insufficient documentation

## 2016-01-16 DIAGNOSIS — Z885 Allergy status to narcotic agent status: Secondary | ICD-10-CM | POA: Insufficient documentation

## 2016-01-16 DIAGNOSIS — I10 Essential (primary) hypertension: Secondary | ICD-10-CM | POA: Diagnosis not present

## 2016-01-16 DIAGNOSIS — Z888 Allergy status to other drugs, medicaments and biological substances status: Secondary | ICD-10-CM | POA: Insufficient documentation

## 2016-01-16 DIAGNOSIS — M1990 Unspecified osteoarthritis, unspecified site: Secondary | ICD-10-CM | POA: Insufficient documentation

## 2016-01-16 DIAGNOSIS — J45909 Unspecified asthma, uncomplicated: Secondary | ICD-10-CM | POA: Insufficient documentation

## 2016-01-16 DIAGNOSIS — Z79899 Other long term (current) drug therapy: Secondary | ICD-10-CM | POA: Insufficient documentation

## 2016-01-16 DIAGNOSIS — G4730 Sleep apnea, unspecified: Secondary | ICD-10-CM | POA: Insufficient documentation

## 2016-01-16 DIAGNOSIS — Z87891 Personal history of nicotine dependence: Secondary | ICD-10-CM | POA: Insufficient documentation

## 2016-01-16 HISTORY — PX: POLYPECTOMY: SHX5525

## 2016-01-16 HISTORY — PX: COLONOSCOPY WITH PROPOFOL: SHX5780

## 2016-01-16 HISTORY — DX: Unspecified asthma, uncomplicated: J45.909

## 2016-01-16 HISTORY — DX: Sleep apnea, unspecified: G47.30

## 2016-01-16 HISTORY — DX: Unspecified osteoarthritis, unspecified site: M19.90

## 2016-01-16 HISTORY — DX: Myoneural disorder, unspecified: G70.9

## 2016-01-16 HISTORY — DX: Hemorrhage of anus and rectum: K62.5

## 2016-01-16 HISTORY — DX: Unspecified hearing loss, unspecified ear: H91.90

## 2016-01-16 HISTORY — DX: Dyspnea, unspecified: R06.00

## 2016-01-16 HISTORY — DX: Dizziness and giddiness: R42

## 2016-01-16 SURGERY — COLONOSCOPY WITH PROPOFOL
Anesthesia: Monitor Anesthesia Care | Wound class: Contaminated

## 2016-01-16 MED ORDER — LIDOCAINE HCL (CARDIAC) 20 MG/ML IV SOLN
INTRAVENOUS | Status: DC | PRN
  Administered 2016-01-16: 50 mg via INTRAVENOUS

## 2016-01-16 MED ORDER — STERILE WATER FOR IRRIGATION IR SOLN
Status: DC | PRN
  Administered 2016-01-16: 09:00:00

## 2016-01-16 MED ORDER — LACTATED RINGERS IV SOLN
INTRAVENOUS | Status: DC | PRN
Start: 2016-01-16 — End: 2016-01-16
  Administered 2016-01-16: 08:00:00 via INTRAVENOUS

## 2016-01-16 MED ORDER — PROPOFOL 10 MG/ML IV BOLUS
INTRAVENOUS | Status: DC | PRN
  Administered 2016-01-16: 100 mg via INTRAVENOUS
  Administered 2016-01-16 (×3): 50 mg via INTRAVENOUS

## 2016-01-16 SURGICAL SUPPLY — 23 items
CANISTER SUCT 1200ML W/VALVE (MISCELLANEOUS) ×3 IMPLANT
CLIP HMST 235XBRD CATH ROT (MISCELLANEOUS) IMPLANT
CLIP RESOLUTION 360 11X235 (MISCELLANEOUS)
FCP ESCP3.2XJMB 240X2.8X (MISCELLANEOUS)
FORCEPS BIOP RAD 4 LRG CAP 4 (CUTTING FORCEPS) ×3 IMPLANT
FORCEPS BIOP RJ4 240 W/NDL (MISCELLANEOUS)
FORCEPS ESCP3.2XJMB 240X2.8X (MISCELLANEOUS) IMPLANT
GOWN CVR UNV OPN BCK APRN NK (MISCELLANEOUS) ×4 IMPLANT
GOWN ISOL THUMB LOOP REG UNIV (MISCELLANEOUS) ×2
INJECTOR VARIJECT VIN23 (MISCELLANEOUS) IMPLANT
KIT DEFENDO VALVE AND CONN (KITS) IMPLANT
KIT ENDO PROCEDURE OLY (KITS) ×3 IMPLANT
MARKER SPOT ENDO TATTOO 5ML (MISCELLANEOUS) IMPLANT
PAD GROUND ADULT SPLIT (MISCELLANEOUS) IMPLANT
PROBE APC STR FIRE (PROBE) IMPLANT
RETRIEVER NET ROTH 2.5X230 LF (MISCELLANEOUS) IMPLANT
SNARE SHORT THROW 13M SML OVAL (MISCELLANEOUS) ×3 IMPLANT
SNARE SHORT THROW 30M LRG OVAL (MISCELLANEOUS) IMPLANT
SNARE SNG USE RND 15MM (INSTRUMENTS) IMPLANT
SPOT EX ENDOSCOPIC TATTOO (MISCELLANEOUS)
TRAP ETRAP POLY (MISCELLANEOUS) ×3 IMPLANT
VARIJECT INJECTOR VIN23 (MISCELLANEOUS)
WATER STERILE IRR 250ML POUR (IV SOLUTION) ×3 IMPLANT

## 2016-01-16 NOTE — Op Note (Signed)
[facility] Gastroenterology Patient Name: Lindsay Stephenson Procedure Date: 01/16/2016 9:07 AM MRN: [id number] Account #: [id number] Date of Birth: 11-26-1945 Admit Type: Outpatient Age: 70 Room: [facility] OR ROOM 01 Gender: Female Note Status: Finalized Procedure:            Colonoscopy Indications:          Chronic diarrhea, Hematochezia Providers:            Lucilla Lame MD, MD Referring MD:         Juline Patch, MD (Referring MD) Medicines:            Propofol per Anesthesia Complications:        No immediate complications. Procedure:            Pre-Anesthesia Assessment:                       - Prior to the procedure, a History and Physical was                        performed, and patient medications and allergies were                        reviewed. The patient's tolerance of previous                        anesthesia was also reviewed. The risks and benefits of                        the procedure and the sedation options and risks were                        discussed with the patient. All questions were                        answered, and informed consent was obtained. Prior                        Anticoagulants: The patient has taken no previous                        anticoagulant or antiplatelet agents. ASA Grade                        Assessment: II - A patient with mild systemic disease.                        After reviewing the risks and benefits, the patient was                        deemed in satisfactory condition to undergo the                        procedure.                       After obtaining informed consent, the colonoscope was                        passed under direct vision. Throughout the procedure,  the patient's blood pressure, pulse, and oxygen                        saturations were monitored continuously. The Olympus                        CF-HQ190L Colonoscope (S#. [id number]) was introduced      through the anus and advanced to the the terminal                        ileum. The colonoscopy was performed without                        difficulty. The patient tolerated the procedure well.                        The quality of the bowel preparation was excellent. Findings:      The perianal and digital rectal examinations were normal.      A 6 mm polyp was found in the transverse colon. The polyp was sessile.       The polyp was removed with a cold snare. Resection and retrieval were       complete.      Multiple small-mouthed diverticula were found in the entire colon.      Patchy moderate inflammation characterized by erythema was found in the       entire colon. Biopsies were taken with a cold forceps for histology.      The terminal ileum appeared normal. Biopsies were taken with a cold       forceps for histology. Impression:           - One 6 mm polyp in the transverse colon, removed with                        a cold snare. Resected and retrieved.                       - Diverticulosis in the entire examined colon.                       - Patchy moderate inflammation was found in the entire                        examined colon secondary to colitis. Biopsied.                       - The examined portion of the ileum was normal.                        Biopsied. Recommendation:       - Discharge patient to home.                       - Resume previous diet.                       - Continue present medications.                       - Await pathology results. Procedure Code(s):    --- Professional ---  45385, Colonoscopy, flexible; with removal of tumor(s),                        polyp(s), or other lesion(s) by snare technique                       45380, 47, Colonoscopy, flexible; with biopsy, single                        or multiple Diagnosis Code(s):    --- Professional ---                       K52.9, Noninfective gastroenteritis and colitis,                         unspecified                       K92.1, Melena (includes Hematochezia)                       D12.3, Benign neoplasm of transverse colon (hepatic                        flexure or splenic flexure) CPT copyright 2016 American Medical Association. All rights reserved. The codes documented in this report are preliminary and upon coder review may  be revised to meet current compliance requirements. Lucilla Lame MD, MD 01/16/2016 9:34:33 AM This report has been signed electronically. Number of Addenda: 0 Note Initiated On: 01/16/2016 9:07 AM Scope Withdrawal Time: 0 hours 7 minutes 27 seconds  Total Procedure Duration: 0 hours 12 minutes 55 seconds       [facility]

## 2016-01-16 NOTE — H&P (Signed)
Lindsay Lame, MD [address] [place] [address] Phone: [phone] Fax : [phone]  Primary Care Physician:  Lindsay Miu, MD Primary Gastroenterologist:  Dr. Allen Stephenson  Pre-Procedure History & Physical: HPI:  Lindsay Stephenson is a 70 y.o. female is here for an colonoscopy.   Past Medical History:  Diagnosis Date  . Allergy   . Anemia    TAKING IRON  . Arthritis    KNEES AND HANDS  . Asthma    COUGHING, NO ATTACKS IN A YEAR OR 2  . Dyspnea    ON EXERTION  . GERD (gastroesophageal reflux disease)   . HOH (hard of hearing)    30-35 % HEARING LOSS  . Hyperlipidemia   . Hypertension    HX OF  . LABD (linear immunoglobulin A bullous dermatosis)   . Mitral valve prolapse   . Mitral valve prolapse   . 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 Rectal bleeding   . Sleep apnea    CPAP  . Vertigo     Past Surgical History:  Procedure Laterality Date  . ABDOMINAL HYSTERECTOMY    . APPENDECTOMY    . REPLACEMENT TOTAL KNEE BILATERAL    . TONSILLECTOMY     ADENOIDECTOMY    Prior to Admission medications   Medication Sig Start Date End Date Taking? Authorizing Provider  albuterol (PROVENTIL HFA;VENTOLIN HFA) 108 (90 Base) MCG/ACT inhaler Inhale 1-2 puffs into the lungs every 6 (six) hours as needed for wheezing or shortness of breath. 01/05/16  Yes Lindsay Patch, MD  dapsone 100 MG tablet 1 tablet daily. Derm IN AM 08/16/15  Yes Historical Provider, MD  ferrous sulfate 325 (65 FE) MG tablet Take 325 mg by mouth every other day.    Yes Historical Provider, MD  folic acid (FOLVITE) 1 MG tablet Take 1 mg by mouth daily.   Yes Historical Provider, MD  Magnesium 250 MG TABS Take 2 tablets by mouth 2 (two) times a week.    Yes Historical Provider, MD  Multiple Vitamin (MULTIVITAMIN) capsule Take 1 capsule by mouth daily.   Yes Historical Provider, MD  omeprazole (PRILOSEC) 20 MG capsule Take 1 capsule (20 mg total) by mouth daily.  10/01/15  Yes Lindsay Patch, MD  amoxicillin-clavulanate (AUGMENTIN) 875-125 MG tablet Take 1 tablet by mouth 2 (two) times daily. Patient not taking: Reported on 01/05/2016 01/05/16   Lindsay Patch, MD  docusate sodium (COLACE) 100 MG capsule Take 300 mg by mouth every other day.     Historical Provider, MD  metroNIDAZOLE (FLAGYL) 500 MG tablet Take 1 tablet (500 mg total) by mouth 2 (two) times daily. Patient not taking: Reported on 01/08/2016 01/05/16   Lindsay Patch, MD  pravastatin (PRAVACHOL) 80 MG tablet Take 1 tablet (80 mg total) by mouth daily. Patient not taking: Reported on 01/16/2016 10/01/15   Lindsay Patch, MD    Allergies as of 01/07/2016 - Review Complete 01/05/2016  Allergen Reaction Noted  . Codeine Other (See Comments) 08/18/2015  . Entex lq [phenylephrine-guaifenesin] Swelling 08/18/2015  . Sulfa antibiotics Itching 08/18/2015    History reviewed. No pertinent family history.  Social History   Social History  . Marital status: Married    Spouse name: Lindsay Stephenson  . Number of children: Lindsay Stephenson  . Years of education: Lindsay Stephenson   Occupational History  . Not on file.   Social History Main Topics  . Smoking status: Former Smoker  Years: 3.00    Types: Cigarettes    Quit date: 32  . Smokeless tobacco: Never Used  . Alcohol use No     Comment: CELEBRATION  . Drug use: No  . Sexual activity: Not Currently   Other Topics Concern  . Not on file   Social History Narrative  . No narrative on file    Review of Systems: See HPI, otherwise negative ROS  Physical Exam: BP (!) 141/59   Pulse 96   Temp 98.4 F (36.9 C)   Resp 16   Ht 5' (1.524 m)   Wt 197 lb (89.4 kg)   SpO2 94%   BMI 38.47 kg/m  General:   Alert,  pleasant and cooperative in NAD Head:  Normocephalic and atraumatic. Neck:  Supple; no masses or thyromegaly. Lungs:  Clear throughout to auscultation.    Heart:  Regular rate and rhythm. Abdomen:  Soft, nontender and nondistended. Normal bowel sounds,  without guarding, and without rebound.   Neurologic:  Alert and  oriented x4;  grossly normal neurologically.  Impression/Plan: Lindsay Stephenson is here for an colonoscopy to be performed for hematochezia and abd pain.  Risks, benefits, limitations, and alternatives regarding  colonoscopy have been reviewed with the patient.  Questions have been answered.  All parties agreeable.   Lindsay Lame, MD  01/16/2016, 8:36 AM

## 2016-01-16 NOTE — Anesthesia Preprocedure Evaluation (Addendum)
Anesthesia Evaluation  Patient identified by MRN, date of birth, ID band Patient awake    Reviewed: Allergy & Precautions, H&P , NPO status , Patient's Chart, lab work & pertinent test results, reviewed documented beta blocker date and time   Airway Mallampati: II  TM Distance: >3 FB Neck ROM: full    Dental no notable dental hx.    Pulmonary shortness of breath and with exertion, asthma , sleep apnea and Continuous Positive Airway Pressure Ventilation , former smoker,    Pulmonary exam normal breath sounds clear to auscultation       Cardiovascular Exercise Tolerance: Good hypertension, On Medications  Rhythm:regular Rate:Normal     Neuro/Psych  Neuromuscular disease negative psych ROS   GI/Hepatic Neg liver ROS, GERD  Medicated,  Endo/Other  negative endocrine ROS  Renal/GU negative Renal ROS  negative genitourinary   Musculoskeletal   Abdominal   Peds  Hematology negative hematology ROS (+) anemia ,   Anesthesia Other Findings   Reproductive/Obstetrics negative OB ROS                           Anesthesia Physical Anesthesia Plan  ASA: III  Anesthesia Plan: MAC   Post-op Pain Management:    Induction:   Airway Management Planned:   Additional Equipment:   Intra-op Plan:   Post-operative Plan:   Informed Consent: I have reviewed the patients History and Physical, chart, labs and discussed the procedure including the risks, benefits and alternatives for the proposed anesthesia with the patient or authorized representative who has indicated his/her understanding and acceptance.   Dental Advisory Given  Plan Discussed with: CRNA  Anesthesia Plan Comments:        Anesthesia Quick Evaluation

## 2016-01-16 NOTE — Anesthesia Postprocedure Evaluation (Signed)
Anesthesia Post Note  Patient: Lindsay Stephenson  Procedure(s) Performed: Procedure(s) (LRB): COLONOSCOPY WITH PROPOFOL (N/A) POLYPECTOMY  Patient location during evaluation: PACU Anesthesia Type: MAC Level of consciousness: awake and alert Pain management: pain level controlled Vital Signs Assessment: post-procedure vital signs reviewed and stable Respiratory status: spontaneous breathing, nonlabored ventilation and respiratory function stable Cardiovascular status: stable and blood pressure returned to baseline Anesthetic complications: no    Adriena Manfre D Audine Mangione

## 2016-01-16 NOTE — Transfer of Care (Signed)
Immediate Anesthesia Transfer of Care Note  Patient: Lindsay Stephenson  Procedure(s) Performed: Procedure(s) with comments: COLONOSCOPY WITH PROPOFOL (N/A) - CPAP POLYPECTOMY  Patient Location: PACU  Anesthesia Type: MAC  Level of Consciousness: awake, alert  and patient cooperative  Airway and Oxygen Therapy: Patient Spontanous Breathing and Patient connected to supplemental oxygen  Post-op Assessment: Post-op Vital signs reviewed, Patient's Cardiovascular Status Stable, Respiratory Function Stable, Patent Airway and No signs of Nausea or vomiting  Post-op Vital Signs: Reviewed and stable  Complications: No apparent anesthesia complications

## 2016-01-19 ENCOUNTER — Encounter: Payer: Self-pay | Admitting: Gastroenterology

## 2016-01-20 ENCOUNTER — Encounter: Payer: Self-pay | Admitting: Gastroenterology

## 2016-01-26 ENCOUNTER — Telehealth: Payer: Self-pay | Admitting: Gastroenterology

## 2016-01-26 NOTE — Telephone Encounter (Signed)
Would like results please also, should she be taking medication for diverticulosis?

## 2016-01-27 ENCOUNTER — Telehealth: Payer: Self-pay

## 2016-01-27 DIAGNOSIS — G4733 Obstructive sleep apnea (adult) (pediatric): Secondary | ICD-10-CM | POA: Diagnosis not present

## 2016-01-27 NOTE — Telephone Encounter (Signed)
-----   Message from Lucilla Lame, MD sent at 01/26/2016 12:23 PM EDT ----- Please have the patient come in for a follow up.

## 2016-01-27 NOTE — Telephone Encounter (Signed)
Pt scheduled for a follow up appt with Dr. Allen Norris on Wednesday, Nov 1st @ 10:00am.

## 2016-01-27 NOTE — Telephone Encounter (Signed)
Pt scheduled for a follow up appt with Dr. Allen Norris on Wednesday, Nov 1st. Will discuss all questions at this appt.

## 2016-01-28 DIAGNOSIS — L138 Other specified bullous disorders: Secondary | ICD-10-CM | POA: Diagnosis not present

## 2016-02-04 ENCOUNTER — Ambulatory Visit (INDEPENDENT_AMBULATORY_CARE_PROVIDER_SITE_OTHER): Payer: Medicare HMO | Admitting: Gastroenterology

## 2016-02-04 ENCOUNTER — Encounter: Payer: Self-pay | Admitting: Gastroenterology

## 2016-02-04 VITALS — BP 140/66 | HR 90 | Temp 98.4°F | Ht 60.0 in | Wt 200.0 lb

## 2016-02-04 DIAGNOSIS — R197 Diarrhea, unspecified: Secondary | ICD-10-CM

## 2016-02-05 NOTE — Progress Notes (Signed)
Primary Care Physician: Otilio Miu, MD  Primary Gastroenterologist:  Dr. Lucilla Lame  Chief Complaint  Patient presents with  . Follow up colonoscopy results    HPI: Lindsay Stephenson is a 70 y.o. female here for follow-up after having colonoscopy. The patient had some diverticulosis and on biopsies was shown to have inflammation. 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 The patient comes in here today to review the pathology. The patient now reports that she has stopped having diarrhea and bloody stools and now is back to being constipated.  Current Outpatient Prescriptions  Medication Sig Dispense Refill  . albuterol (PROVENTIL HFA;VENTOLIN HFA) 108 (90 Base) MCG/ACT inhaler Inhale 1-2 puffs into the lungs every 6 (six) hours as needed for wheezing or shortness of breath. 1 Inhaler 1  . Clobetasol Propionate 0.05 % shampoo     . dapsone 100 MG tablet 1 tablet daily. Derm IN AM    . docusate sodium (COLACE) 100 MG capsule Take 300 mg by mouth every other day.     . ferrous sulfate 325 (65 FE) MG tablet Take 325 mg by mouth every other day.     . folic acid (FOLVITE) 1 MG tablet Take 1 mg by mouth daily.    . Magnesium 250 MG TABS Take 2 tablets by mouth 2 (two) times a week.     . Multiple Vitamin (MULTIVITAMIN) capsule Take 1 capsule by mouth daily.    Marland Kitchen omeprazole (PRILOSEC) 20 MG capsule Take 1 capsule (20 mg total) by mouth daily. 90 capsule 1  . pravastatin (PRAVACHOL) 80 MG tablet Take 1 tablet (80 mg total) by mouth daily. 90 tablet 1  . TRIANEX 0.05 % OINT     . amoxicillin-clavulanate (AUGMENTIN) 875-125 MG tablet Take 1 tablet by mouth 2 (two) times daily. (Patient not taking: Reported on 02/04/2016) 14 tablet 0  . metroNIDAZOLE (FLAGYL) 500 MG tablet Take 1 tablet (500 mg total) by mouth 2 (two) times daily. (Patient not taking: Reported on  02/04/2016) 14 tablet 0   No current facility-administered medications for this visit.     Allergies as of 02/04/2016 - Review Complete 02/04/2016  Allergen Reaction Noted  . Entex lq [phenylephrine-guaifenesin] Swelling 08/18/2015  . Codeine Hives and Other (See Comments) 08/18/2015  . Sulfa antibiotics Itching 08/18/2015  . Tape Other (See Comments) 01/08/2016    ROS:  General: Negative for anorexia, weight loss, fever, chills, fatigue, weakness. ENT: Negative for hoarseness, difficulty swallowing , nasal congestion. CV: Negative for chest pain, angina, palpitations, dyspnea on exertion, peripheral edema.  Respiratory: Negative for dyspnea at rest, dyspnea on exertion, cough, sputum, wheezing.  GI: See history of present illness. GU:  Negative for dysuria, hematuria, urinary incontinence, urinary frequency, nocturnal urination.  Endo: Negative for unusual weight change.    Physical Examination:   BP 140/66   Pulse 90   Temp 98.4 F (36.9 C) (Oral)   Ht 5' (1.524 m)   Wt 200 lb (90.7 kg)   BMI 39.06 kg/m   General: Well-nourished, well-developed in no acute distress.  Eyes: No icterus. Conjunctivae pink. Mouth: Oropharyngeal mucosa moist and pink , no lesions erythema or exudate. Lungs: Clear to auscultation bilaterally. Non-labored. Heart: Regular rate and rhythm, no murmurs rubs or gallops.  Abdomen: Bowel sounds are normal, nontender, nondistended, no hepatosplenomegaly or masses, no abdominal bruits or hernia , no rebound or  guarding.   Extremities: No lower extremity edema. No clubbing or deformities. Neuro: Alert and oriented x 3.  Grossly intact. Skin: Warm and dry, no jaundice.   Psych: Alert and cooperative, normal mood and affect.  Labs:    Imaging Studies: No results found.  Assessment and Plan:   Lindsay Stephenson is a 46 y.o. y/o female who comes in today with a history of diarrhea with rectal bleeding. The patient underwent a colonoscopy with biopsies  throughout the entire colon that showed her to have inflammation in that due to her symptoms was thought to be inflammatory bowel disease. The patient now reports that she is not having any further diarrhea and is actually constipated. The patient has been told to take increased fiber and MiraLAX daily with the addition of prune juice as needed. The patient will take something every day so that she does not become constipated. The patient will contact me if her diarrhea comes back.    Lucilla Lame, MD. Marval Regal   Note: This dictation was prepared with Dragon dictation along with smaller phrase technology. Any transcriptional errors that result from this process are unintentional.

## 2016-02-11 ENCOUNTER — Ambulatory Visit: Payer: Medicare HMO | Admitting: Family Medicine

## 2016-03-01 DIAGNOSIS — I951 Orthostatic hypotension: Secondary | ICD-10-CM | POA: Diagnosis not present

## 2016-03-01 DIAGNOSIS — M6281 Muscle weakness (generalized): Secondary | ICD-10-CM | POA: Diagnosis not present

## 2016-03-01 DIAGNOSIS — M2550 Pain in unspecified joint: Secondary | ICD-10-CM | POA: Diagnosis not present

## 2016-03-01 DIAGNOSIS — L138 Other specified bullous disorders: Secondary | ICD-10-CM | POA: Diagnosis not present

## 2016-03-01 DIAGNOSIS — R5382 Chronic fatigue, unspecified: Secondary | ICD-10-CM | POA: Diagnosis not present

## 2016-03-01 DIAGNOSIS — E538 Deficiency of other specified B group vitamins: Secondary | ICD-10-CM | POA: Diagnosis not present

## 2016-03-01 DIAGNOSIS — Z9181 History of falling: Secondary | ICD-10-CM | POA: Diagnosis not present

## 2016-03-01 DIAGNOSIS — R2689 Other abnormalities of gait and mobility: Secondary | ICD-10-CM | POA: Diagnosis not present

## 2016-03-02 ENCOUNTER — Other Ambulatory Visit: Payer: Self-pay | Admitting: Internal Medicine

## 2016-03-02 DIAGNOSIS — R2689 Other abnormalities of gait and mobility: Secondary | ICD-10-CM

## 2016-03-02 DIAGNOSIS — G4733 Obstructive sleep apnea (adult) (pediatric): Secondary | ICD-10-CM | POA: Diagnosis not present

## 2016-03-04 ENCOUNTER — Other Ambulatory Visit: Payer: Self-pay

## 2016-03-04 ENCOUNTER — Telehealth: Payer: Self-pay

## 2016-03-04 NOTE — Telephone Encounter (Signed)
Dr Francesca Oman office called with the results from vitamin B12 labs- pt needs to have Vit B12 inj 1 time q month for 6 months then wants [place] to redraw her B12 lab and notify them of results. Pt will sched first B12 inj today

## 2016-03-05 ENCOUNTER — Ambulatory Visit (INDEPENDENT_AMBULATORY_CARE_PROVIDER_SITE_OTHER): Payer: Medicare HMO

## 2016-03-05 DIAGNOSIS — E538 Deficiency of other specified B group vitamins: Secondary | ICD-10-CM | POA: Diagnosis not present

## 2016-03-05 MED ORDER — CYANOCOBALAMIN 1000 MCG/ML IJ SOLN
1000.0000 ug | Freq: Once | INTRAMUSCULAR | Status: AC
  Administered 2016-03-05: 1000 ug via INTRAMUSCULAR

## 2016-03-12 DIAGNOSIS — L138 Other specified bullous disorders: Secondary | ICD-10-CM | POA: Diagnosis not present

## 2016-03-16 ENCOUNTER — Ambulatory Visit
Admission: RE | Admit: 2016-03-16 | Discharge: 2016-03-16 | Disposition: A | Discharge status: Home / Self Care | Payer: Medicare HMO | Source: Ambulatory Visit | Attending: Internal Medicine | Admitting: Internal Medicine

## 2016-03-16 DIAGNOSIS — M4686 Other specified inflammatory spondylopathies, lumbar region: Secondary | ICD-10-CM | POA: Insufficient documentation

## 2016-03-16 DIAGNOSIS — M2578 Osteophyte, vertebrae: Secondary | ICD-10-CM | POA: Insufficient documentation

## 2016-03-16 DIAGNOSIS — R2689 Other abnormalities of gait and mobility: Secondary | ICD-10-CM | POA: Diagnosis not present

## 2016-03-16 DIAGNOSIS — M48061 Spinal stenosis, lumbar region without neurogenic claudication: Secondary | ICD-10-CM | POA: Diagnosis not present

## 2016-03-16 DIAGNOSIS — M545 Low back pain: Secondary | ICD-10-CM | POA: Diagnosis not present

## 2016-03-16 DIAGNOSIS — M5126 Other intervertebral disc displacement, lumbar region: Secondary | ICD-10-CM | POA: Diagnosis not present

## 2016-03-22 DIAGNOSIS — E538 Deficiency of other specified B group vitamins: Secondary | ICD-10-CM | POA: Insufficient documentation

## 2016-03-22 DIAGNOSIS — R2689 Other abnormalities of gait and mobility: Secondary | ICD-10-CM | POA: Diagnosis not present

## 2016-03-22 DIAGNOSIS — M5136 Other intervertebral disc degeneration, lumbar region: Secondary | ICD-10-CM | POA: Diagnosis not present

## 2016-03-22 DIAGNOSIS — E559 Vitamin D deficiency, unspecified: Secondary | ICD-10-CM | POA: Diagnosis not present

## 2016-03-22 DIAGNOSIS — L138 Other specified bullous disorders: Secondary | ICD-10-CM | POA: Diagnosis not present

## 2016-04-02 ENCOUNTER — Encounter: Payer: Self-pay | Admitting: Family Medicine

## 2016-04-02 ENCOUNTER — Ambulatory Visit: Payer: Medicare HMO | Admitting: Family Medicine

## 2016-04-02 ENCOUNTER — Ambulatory Visit (INDEPENDENT_AMBULATORY_CARE_PROVIDER_SITE_OTHER): Payer: Medicare HMO | Admitting: Family Medicine

## 2016-04-02 ENCOUNTER — Ambulatory Visit: Payer: Medicare HMO

## 2016-04-02 VITALS — BP 130/70 | HR 78 | Ht 60.0 in | Wt 203.0 lb

## 2016-04-02 DIAGNOSIS — R251 Tremor, unspecified: Secondary | ICD-10-CM | POA: Diagnosis not present

## 2016-04-02 DIAGNOSIS — R296 Repeated falls: Secondary | ICD-10-CM

## 2016-04-02 DIAGNOSIS — E538 Deficiency of other specified B group vitamins: Secondary | ICD-10-CM | POA: Diagnosis not present

## 2016-04-02 DIAGNOSIS — R2689 Other abnormalities of gait and mobility: Secondary | ICD-10-CM

## 2016-04-02 MED ORDER — CYANOCOBALAMIN 1000 MCG/ML IJ SOLN
1000.0000 ug | Freq: Once | INTRAMUSCULAR | Status: AC
  Administered 2016-04-02: 1000 ug via INTRAMUSCULAR

## 2016-04-02 NOTE — Progress Notes (Signed)
Name: Lindsay Stephenson   MRN: [id number]    DOB: [birth year]   Date:04/02/2016       Progress Note  Subjective  Chief Complaint  Chief Complaint  Patient presents with  . Follow-up    visit with Dr Bock/ rheum and Dr Nehemiah Massed cardio     Patient returns for followup Dr Meda Coffee  And concerns of tremor and falls/later in day foot catches edge of carpet.    No problem-specific Assessment & Plan notes found for this encounter.   Past Medical History:  Diagnosis Date  . Allergy   . Anemia    TAKING IRON  . Arthritis    KNEES AND HANDS  . Asthma    COUGHING, NO ATTACKS IN A YEAR OR 2  . Dyspnea    ON EXERTION  . GERD (gastroesophageal reflux disease)   . HOH (hard of hearing)    30-35 % HEARING LOSS  . Hyperlipidemia   . Hypertension    HX OF  . LABD (linear immunoglobulin A bullous dermatosis)   . Mitral valve prolapse   . Mitral valve prolapse   . 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 Rectal bleeding   . Sleep apnea    CPAP  . Vertigo     Past Surgical History:  Procedure Laterality Date  . ABDOMINAL HYSTERECTOMY    . APPENDECTOMY    . COLONOSCOPY WITH PROPOFOL N/A 01/16/2016   Procedure: COLONOSCOPY WITH PROPOFOL;  Surgeon: Lucilla Lame, MD;  Location: [facility];  Service: Endoscopy;  Laterality: N/A;  CPAP  . POLYPECTOMY  01/16/2016   Procedure: POLYPECTOMY;  Surgeon: Lucilla Lame, MD;  Location: [place] [facility];  Service: Endoscopy;;  . REPLACEMENT TOTAL KNEE BILATERAL    . TONSILLECTOMY     ADENOIDECTOMY    No family history on file.  Social History   Social History  . Marital status: Married    Spouse name: N/A  . Number of children: N/A  . Years of education: N/A   Occupational History  . Not on file.   Social History Main Topics  . Smoking status: Former Smoker    Years: 3.00    Types: Cigarettes    Quit date: 1969  . Smokeless tobacco: Never Used  . Alcohol use No     Comment: CELEBRATION  .  Drug use: No  . Sexual activity: Not Currently   Other Topics Concern  . Not on file   Social History Narrative  . No narrative on file    Allergies  Allergen Reactions  . Entex Lq [Phenylephrine-Guaifenesin] Swelling    TONGUE  . Codeine Hives and Other (See Comments)    Pt reports only having symptoms after receiving large amounts of medication  . Sulfa Antibiotics Itching  . Tape Other (See Comments)    CAN PULL OFF LAYER OF SKIN     Review of Systems  Constitutional: Negative for chills, fever, malaise/fatigue and weight loss.  HENT: Negative for ear discharge, ear pain and sore throat.   Eyes: Negative for blurred vision.  Respiratory: Negative for cough, sputum production, shortness of breath and wheezing.   Cardiovascular: Negative for chest pain, palpitations and leg swelling.  Gastrointestinal: Negative for abdominal pain, blood in stool, constipation, diarrhea, heartburn, melena and nausea.  Genitourinary: Negative for dysuria, frequency, hematuria and urgency.  Musculoskeletal: Positive for back pain. Negative for joint pain, myalgias and neck pain.  Skin: Negative for rash.  Neurological: Positive  for tremors. Negative for dizziness, tingling, sensory change, speech change, focal weakness, seizures, loss of consciousness and headaches.  Endo/Heme/Allergies: Negative for environmental allergies and polydipsia. Does not bruise/bleed easily.  Psychiatric/Behavioral: Negative for depression and suicidal ideas. The patient is not nervous/anxious and does not have insomnia.      Objective  Vitals:   04/02/16 1001  BP: 130/70  Pulse: 78  Weight: 203 lb (92.1 kg)  Height: 5' (1.524 m)    Physical Exam  Constitutional: She is well-developed, well-nourished, and in no distress. No distress.  HENT:  Head: Normocephalic and atraumatic.  Right Ear: External ear normal.  Left Ear: External ear normal.  Nose: Nose normal.  Mouth/Throat: Oropharynx is clear and  moist.  Eyes: Conjunctivae and EOM are normal. Pupils are equal, round, and reactive to light. Right eye exhibits no discharge. Left eye exhibits no discharge.  Neck: Normal range of motion. Neck supple. No JVD present. No thyromegaly present.  Cardiovascular: Normal rate, regular rhythm, normal heart sounds and intact distal pulses.  Exam reveals no gallop and no friction rub.   No murmur heard. Pulmonary/Chest: Effort normal and breath sounds normal. No respiratory distress. She has no wheezes. She has no rales.  Abdominal: Soft. Bowel sounds are normal. She exhibits no mass. There is no tenderness. There is no guarding.  Musculoskeletal: Normal range of motion. She exhibits no edema.  Lymphadenopathy:    She has no cervical adenopathy.  Neurological: She is alert. She has normal motor skills, normal sensation, normal strength, normal reflexes and intact cranial nerves.  Skin: Skin is warm and dry. She is not diaphoretic.  Psychiatric: Mood and affect normal.  Nursing note and vitals reviewed.     Assessment & Plan  Problem List Items Addressed This Visit    None    Visit Diagnoses    Falls frequently    -  Primary   Relevant Orders   Ambulatory referral to Neurology   B12 deficiency       Relevant Medications   cyanocobalamin ((VITAMIN B-12)) injection 1,000 mcg (Completed)   Tremor       Balance problem            Dr. [facility]  04/02/16

## 2016-04-28 ENCOUNTER — Ambulatory Visit (INDEPENDENT_AMBULATORY_CARE_PROVIDER_SITE_OTHER): Payer: Medicare HMO

## 2016-04-28 ENCOUNTER — Encounter: Payer: Self-pay | Admitting: *Deleted

## 2016-04-28 ENCOUNTER — Ambulatory Visit
Admission: EM | Admit: 2016-04-28 | Discharge: 2016-04-28 | Disposition: A | Discharge status: Home / Self Care | Payer: Medicare HMO | Attending: Family Medicine | Admitting: Family Medicine

## 2016-04-28 DIAGNOSIS — S63502A Unspecified sprain of left wrist, initial encounter: Secondary | ICD-10-CM

## 2016-04-28 DIAGNOSIS — S60212A Contusion of left wrist, initial encounter: Secondary | ICD-10-CM

## 2016-04-28 DIAGNOSIS — S6992XA Unspecified injury of left wrist, hand and finger(s), initial encounter: Secondary | ICD-10-CM | POA: Diagnosis not present

## 2016-04-28 NOTE — ED Provider Notes (Signed)
[facility]    CSN: [id number] Arrival date & time: 04/28/16  1132     History   Chief Complaint Chief Complaint  Patient presents with  . Wrist Injury    HPI Lindsay Stephenson is a 71 y.o. female.   The history is provided by the patient.  Wrist Injury  Location:  Wrist Wrist location:  L wrist Injury: yes   Time since incident:  1 week Mechanism of injury: fall   Fall:    Fall occurred:  Tripped   Height of fall:  Level floor   Impact surface:  Hard floor   Point of impact:  Hands   Entrapped after fall: no   Handedness:  Right-handed Dislocation: no   Foreign body present:  No foreign bodies Tetanus status:  Up to date Prior injury to area:  No Relieved by:  Nothing Ineffective treatments:  Ice and rest Associated symptoms: no back pain, no fever, no muscle weakness, no numbness and no tingling     Past Medical History:  Diagnosis Date  . Allergy   . Anemia    TAKING IRON  . Arthritis    KNEES AND HANDS  . Asthma    COUGHING, NO ATTACKS IN A YEAR OR 2  . Dyspnea    ON EXERTION  . GERD (gastroesophageal reflux disease)   . HOH (hard of hearing)    30-35 % HEARING LOSS  . Hyperlipidemia   . Hypertension    HX OF  . LABD (linear immunoglobulin A bullous dermatosis)   . Mitral valve prolapse   . Mitral valve prolapse   . 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 Rectal bleeding   . Sleep apnea    CPAP  . Vertigo     Patient Active Problem List   Diagnosis Date Noted  . Blood in stool   . Benign neoplasm of transverse colon   . Inflammatory bowel disease   . Paroxysmal supraventricular tachycardia ([place]) 11/18/2015  . Shortness of breath 10/29/2015  . H/O fall 10/23/2015  . 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 Hyperlipidemia 10/01/2015  . Essential hypertension 10/01/2015  . Esophageal reflux 10/01/2015  . 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 ABDOMINAL HYSTERECTOMY    . APPENDECTOMY    . COLONOSCOPY WITH PROPOFOL N/A 01/16/2016   Procedure: COLONOSCOPY WITH PROPOFOL;  Surgeon: Lucilla Lame, MD;  Location: [facility];  Service: Endoscopy;  Laterality: N/A;  CPAP  . POLYPECTOMY  01/16/2016   Procedure: POLYPECTOMY;  Surgeon: Lucilla Lame, MD;  Location: [place];  Service: Endoscopy;;  . REPLACEMENT TOTAL KNEE BILATERAL    . TONSILLECTOMY     ADENOIDECTOMY    OB History    No data available       Home Medications    Prior to Admission medications   Medication Sig Start Date End Date Taking? Authorizing Provider  albuterol (PROVENTIL HFA;VENTOLIN HFA) 108 (90 Base) MCG/ACT inhaler Inhale 1-2 puffs into the lungs every 6 (six) hours as needed for wheezing or shortness of breath. 01/05/16  Yes Juline Patch, MD  Clobetasol Propionate 0.05 % shampoo  01/28/16  Yes Historical Provider, MD  dapsone 100 MG tablet 1 tablet daily. Derm IN AM 08/16/15  Yes Historical Provider, MD  ferrous sulfate 325 (65 FE) MG tablet Take 325 mg by mouth every other day.    Yes Historical Provider, MD  folic acid (FOLVITE) 1 MG tablet Take 1 mg by mouth daily.   Yes Historical Provider, MD  Multiple Vitamin (MULTIVITAMIN) capsule Take 1 capsule by mouth daily.   Yes Historical Provider, MD  omeprazole (PRILOSEC) 20 MG capsule Take 1 capsule (20 mg total) by mouth daily. 10/01/15  Yes Juline Patch, MD  TRIANEX 0.05 % OINT  01/29/16  Yes Historical Provider, MD  VITAMIN B1-B12 IM Inject into the muscle every 30 (thirty) days.   Yes Historical Provider, MD    Family History History reviewed. No pertinent family history.  Social History Social History  Substance Use Topics  . Smoking status: Former Smoker    Years: 3.00    Types: Cigarettes    Quit date: 1969  . Smokeless tobacco: Never Used  . Alcohol use No     Comment: CELEBRATION     Allergies   Entex lq [phenylephrine-guaifenesin]; Codeine; Sulfa  antibiotics; and Tape   Review of Systems Review of Systems  Constitutional: Negative for fever.  Musculoskeletal: Negative for back pain.     Physical Exam Triage Vital Signs ED Triage Vitals  Enc Vitals Group     BP 04/28/16 1153 (!) 152/65     Pulse Rate 04/28/16 1153 79     Resp 04/28/16 1153 16     Temp 04/28/16 1153 98.3 F (36.8 C)     Temp Source 04/28/16 1153 Oral     SpO2 04/28/16 1153 95 %     Weight 04/28/16 1155 196 lb (88.9 kg)     Height 04/28/16 1155 5' (1.524 m)     Head Circumference --      Peak Flow --      Pain Score 04/28/16 1158 4     Pain Loc --      Pain Edu? --      Excl. in [place]? --    No data found.   Updated Vital Signs BP (!) 152/65 (BP Location: Left Arm)   Pulse 79   Temp 98.3 F (36.8 C) (Oral)   Resp 16   Ht 5' (1.524 m)   Wt 196 lb (88.9 kg)   SpO2 95%   BMI 38.28 kg/m   Visual Acuity Right Eye Distance:   Left Eye Distance:   Bilateral Distance:    Right Eye Near:   Left Eye Near:    Bilateral Near:     Physical Exam  Constitutional: She appears well-developed and well-nourished. No distress.  Musculoskeletal:       Left wrist: She exhibits tenderness and bony tenderness. She exhibits normal range of motion, no swelling, no effusion, no crepitus, no deformity and no laceration.  Skin: She is not diaphoretic.  Nursing note and vitals reviewed.    UC Treatments / Results  Labs (all labs ordered are listed, but only abnormal results are displayed) Labs Reviewed - No data to display  EKG  EKG Interpretation None       Radiology Dg Wrist Complete Left  Result Date: 04/28/2016 CLINICAL DATA:  Fall. EXAM: LEFT WRIST - COMPLETE 3+ VIEW COMPARISON:  No recent prior . FINDINGS: Diffuse degenerative change. Degenerative changes most prominent first carpometacarpal joint. No acute abnormality. No evidence of fracture or dislocation. IMPRESSION: Over 1 Diffuse degenerative change. Degenerative changes most prominent  about first carpometacarpal joint. 2. No acute abnormality. Electronically Signed   By: Marcello Moores  Register   On: 04/28/2016 13:26    Procedures Procedures (including critical care time)  Medications Ordered in  UC Medications - No data to display   Initial Impression / Assessment and Plan / UC Course  I have reviewed the triage vital signs and the nursing notes.  Pertinent labs & imaging results that were available during my care of the patient were reviewed by me and considered in my medical decision making (see chart for details).       Final Clinical Impressions(s) / UC Diagnoses   Final diagnoses:  Contusion of left wrist, initial encounter  Sprain of left wrist, initial encounter    New Prescriptions Discharge Medication List as of 04/28/2016  1:38 PM     1. x-ray results (negative for acute fracture) and diagnosis reviewed with patient 2. Recommend supportive treatment with rest, ice, otc acetaminophen; wrist splint for support/rest 3. Follow-up prn if symptoms worsen or don't improve   Norval Gable, MD 04/28/16 1821

## 2016-04-28 NOTE — ED Triage Notes (Signed)
Patient injured left wrist 1 week ago when she fell in her bathroom. Patient reports falling multiple times recently and has an appointment with the neurologist tomorrow.

## 2016-04-29 DIAGNOSIS — R259 Unspecified abnormal involuntary movements: Secondary | ICD-10-CM | POA: Diagnosis not present

## 2016-04-29 DIAGNOSIS — R2681 Unsteadiness on feet: Secondary | ICD-10-CM | POA: Diagnosis not present

## 2016-05-03 ENCOUNTER — Ambulatory Visit (INDEPENDENT_AMBULATORY_CARE_PROVIDER_SITE_OTHER): Payer: Medicare HMO | Admitting: Family Medicine

## 2016-05-03 ENCOUNTER — Encounter: Payer: Self-pay | Admitting: Family Medicine

## 2016-05-03 VITALS — BP 120/70 | HR 78 | Ht 60.0 in | Wt 200.0 lb

## 2016-05-03 DIAGNOSIS — Z9181 History of falling: Secondary | ICD-10-CM

## 2016-05-03 DIAGNOSIS — E538 Deficiency of other specified B group vitamins: Secondary | ICD-10-CM | POA: Diagnosis not present

## 2016-05-03 DIAGNOSIS — E559 Vitamin D deficiency, unspecified: Secondary | ICD-10-CM

## 2016-05-03 DIAGNOSIS — Z0000 Encounter for general adult medical examination without abnormal findings: Secondary | ICD-10-CM

## 2016-05-03 MED ORDER — CYANOCOBALAMIN 1000 MCG/ML IJ SOLN
1000.0000 ug | Freq: Once | INTRAMUSCULAR | Status: AC
  Administered 2016-05-03: 1000 ug via INTRAMUSCULAR

## 2016-05-03 MED ORDER — VITAMIN D (ERGOCALCIFEROL) 1.25 MG (50000 UNIT) PO CAPS: 50000.0000 [IU] | ORAL_CAPSULE | ORAL | 3 refills | Status: DC

## 2016-05-03 NOTE — Progress Notes (Signed)
Patient: Lindsay Stephenson, Female    DOB: 05-27-1945, 71 y.o.   MRN: [id number] Visit Date: 05/03/2016  Today's Provider: Otilio Miu, MD   Chief Complaint  Patient presents with  . medicare annual wellness  . vitamin d def    needs labs   Subjective:   Initial preventative physical exam Lindsay Stephenson is a 70 y.o. female who presents today for her Initial Preventative Physical Exam. She feels well. She reports exercising . She reports she is sleeping well.  Patient presents for Medicare Annual Wellness    Review of Systems  Constitutional: Negative for chills, fatigue, fever and unexpected weight change.  HENT: Negative for congestion, drooling, ear discharge, ear pain, rhinorrhea, sinus pressure, sneezing and sore throat.   Eyes: Negative for photophobia, pain, discharge, redness and itching.  Respiratory: Negative for cough, shortness of breath, wheezing and stridor.   Cardiovascular: Negative for chest pain, palpitations and leg swelling.  Gastrointestinal: Negative for abdominal pain, blood in stool, constipation, diarrhea, nausea and vomiting.  Endocrine: Negative for cold intolerance, heat intolerance, polydipsia, polyphagia and polyuria.  Genitourinary: Negative for dysuria, flank pain, frequency, hematuria, menstrual problem, pelvic pain, urgency, vaginal bleeding and vaginal discharge.  Musculoskeletal: Negative for arthralgias, back pain, myalgias and neck pain.  Skin: Negative for rash.  Allergic/Immunologic: Negative for environmental allergies and food allergies.  Neurological: Positive for tremors and weakness. Negative for dizziness, light-headedness, numbness and headaches.  Hematological: Negative for adenopathy. Does not bruise/bleed easily.  Psychiatric/Behavioral: Negative for dysphoric mood and suicidal ideas. The patient is not nervous/anxious.     Social History   Social History  . Marital status: Married    Spouse name: N/A  . Number of children: N/A  .  Years of education: N/A   Occupational History  . Not on file.   Social History Main Topics  . Smoking status: Former Smoker    Years: 3.00    Types: Cigarettes    Quit date: 1969  . Smokeless tobacco: Never Used  . Alcohol use No     Comment: CELEBRATION  . Drug use: No  . Sexual activity: Not Currently   Other Topics Concern  . Not on file   Social History Narrative  . No narrative on file    Patient Active Problem List   Diagnosis Date Noted  . Blood in stool   . Benign neoplasm of transverse colon   . Inflammatory bowel disease   . Paroxysmal supraventricular tachycardia ([place]) 11/18/2015  . Shortness of breath 10/29/2015  . H/O fall 10/23/2015  . 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 Hyperlipidemia 10/01/2015  . Essential hypertension 10/01/2015  . Esophageal reflux 10/01/2015  . 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 ABDOMINAL HYSTERECTOMY    . APPENDECTOMY    . COLONOSCOPY WITH PROPOFOL N/A 01/16/2016   Procedure: COLONOSCOPY WITH PROPOFOL;  Surgeon: Lucilla Lame, MD;  Location: [facility];  Service: Endoscopy;  Laterality: N/A;  CPAP  . POLYPECTOMY  01/16/2016   Procedure: POLYPECTOMY;  Surgeon: Lucilla Lame, MD;  Location: [facility];  Service: Endoscopy;;  . REPLACEMENT TOTAL KNEE BILATERAL    . TONSILLECTOMY     ADENOIDECTOMY    Her family history is not on file.     Previous Medications   ALBUTEROL (PROVENTIL HFA;VENTOLIN HFA) 108 (90 BASE) MCG/ACT INHALER    Inhale 1-2 puffs into the lungs every 6 (six) hours as needed for wheezing or shortness  of breath.   CLOBETASOL PROPIONATE 0.05 % SHAMPOO       DAPSONE 100 MG TABLET    1 tablet daily. Derm IN AM   FERROUS SULFATE 325 (65 FE) MG TABLET    Take 325 mg by mouth every other day.    FOLIC ACID (FOLVITE) 1 MG TABLET    Take 1 mg by mouth daily.   MULTIPLE VITAMIN (MULTIVITAMIN) CAPSULE    Take 1 capsule by mouth daily.    OMEPRAZOLE (PRILOSEC) 20 MG CAPSULE    Take 1 capsule (20 mg total) by mouth daily.   TRIANEX 0.05 % OINT       VITAMIN B1-B12 IM    Inject into the muscle every 30 (thirty) days.    Patient Care Team: Juline Patch, MD as PCP - General (Family Medicine)      Objective:   Vitals: BP 120/70   Pulse 78   Ht 5' (1.524 m)   Wt 200 lb (90.7 kg)   BMI 39.06 kg/m   Physical Exam  Constitutional: She is oriented to person, place, and time. She appears well-developed and well-nourished.  HENT:  Head: Normocephalic.  Right Ear: External ear normal.  Left Ear: External ear normal.  Mouth/Throat: Oropharynx is clear and moist.  Eyes: Conjunctivae and EOM are normal. Pupils are equal, round, and reactive to light. Lids are everted and swept, no foreign bodies found. Left eye exhibits no hordeolum. No foreign body present in the left eye. Right conjunctiva is not injected. Left conjunctiva is not injected. No scleral icterus.  Neck: Normal range of motion. Neck supple. No JVD present. No tracheal deviation present. No thyromegaly present.  Cardiovascular: Normal rate, regular rhythm, normal heart sounds and intact distal pulses.  Exam reveals no gallop and no friction rub.   No murmur heard. Pulmonary/Chest: Effort normal and breath sounds normal. No respiratory distress. She has no wheezes. She has no rales.  Abdominal: Soft. Bowel sounds are normal. She exhibits no mass. There is no hepatosplenomegaly. There is no tenderness. There is no rebound and no guarding.  Musculoskeletal: Normal range of motion. She exhibits no edema or tenderness.  Lymphadenopathy:    She has no cervical adenopathy.  Neurological: She is alert and oriented to person, place, and time. She has normal strength. She displays normal reflexes. No cranial nerve deficit.  Skin: Skin is warm. No rash noted.  Psychiatric: She has a normal mood and affect. Her mood appears not anxious. She does not exhibit a depressed mood.   Nursing note and vitals reviewed.    No exam data present  Activities of Daily Living In your present state of health, do you have any difficulty performing the following activities: 05/03/2016 01/16/2016  Hearing? N N  Vision? N N  Difficulty concentrating or making decisions? N N  Walking or climbing stairs? Y N  Dressing or bathing? N N  Doing errands, shopping? N -  Preparing Food and eating ? N -  Using the Toilet? N -  In the past six months, have you accidently leaked urine? N -  Do you have problems with loss of bowel control? N -  Managing your Medications? N -  Managing your Finances? N -  Housekeeping or managing your Housekeeping? N -    Fall Risk Assessment Fall Risk  05/03/2016 09/30/2015  Falls in the past year? Yes Yes  Number falls in past yr: 2 or more 2 or more  Injury with Fall? No No  Risk Factor Category  High Fall Risk -  Follow up Falls evaluation completed Falls evaluation completed     Patient reports there are safety devices in place in shower at home.   Depression Screen PHQ 2/9 Scores 05/03/2016 09/30/2015  PHQ - 2 Score 0 0    Cognitive Testing - 6-CIT   Correct? Score   What year is it? yes 0 Yes = 0    No = 4  What month is it? yes 0 Yes = 0    No = 3  Remember:     [address]ia [address], [place], [place]     What time is it? yes 0 Yes = 0    No = 3  Count backwards from 20 to 1 yes 0 Correct = 0    1 error = 2   More than 1 error = 4  Say the months of the year in reverse. yes 0 Correct = 0    1 error = 2   More than 1 error = 4  What address did I ask you to remember? yes 0 Correct = 0  1 error = 2    2 error = 4    3 error = 6    4 error = 8    All wrong = 10       TOTAL SCORE  0/28   Interpretation:  Normal  Normal (0-7) Abnormal (8-28)     Assessment & Plan:     Initial Preventative Physical Exam  Reviewed patient's Family Medical History Reviewed and updated list of patient's medical providers Assessment of cognitive  impairment was done Assessed patient's functional ability Established a written schedule for health screening [place] Completed and Reviewed  Exercise Activities and Dietary recommendations Goals    None      Immunization History  Administered Date(s) Administered  . Influenza,inj,Quad PF,36+ Mos 01/02/2016  . Tdap 08/18/2015    Health Maintenance  Topic Date Due  . Hepatitis C Screening  09/18/1945  . MAMMOGRAM  05/03/2016  . TETANUS/TDAP  08/17/2025  . COLONOSCOPY  01/15/2026  . INFLUENZA VACCINE  Completed  . DEXA SCAN  Addressed  . ZOSTAVAX  Addressed  . PNA vac Low Risk Adult  Addressed     Discussed health benefits of physical activity, and encouraged her to engage in regular exercise appropriate for her age and condition.    ------------------------------------------------------------------------------------------------------------  Problem List Items Addressed This Visit    None    Visit Diagnoses    Medicare annual wellness visit, subsequent    -  Primary   Vitamin D deficiency       Relevant Medications   Vitamin D, Ergocalciferol, (DRISDOL) 50000 units CAPS capsule   Other Relevant Orders   Vitamin D (25 hydroxy)   At risk for falling       dicussed safety features   Vitamin B12 deficiency       Relevant Medications   cyanocobalamin ((VITAMIN B-12)) injection 1,000 mcg (Completed)    home safety discussed with pt as well as fall prevention- follow up appt discussed with pt and continuation of vitamin b Lane, MD [facility]  05/03/2016

## 2016-05-03 NOTE — Patient Instructions (Signed)
Fall Prevention in the Home Introduction Falls can cause injuries. They can happen to people of all ages. There are many things you can do to make your home safe and to help prevent falls. What can I do on the outside of my home?  Regularly fix the edges of walkways and driveways and fix any cracks.  Remove anything that might make you trip as you walk through a door, such as a raised step or threshold.  Trim any bushes or trees on the path to your home.  Use bright outdoor lighting.  Clear any walking paths of anything that might make someone trip, such as rocks or tools.  Regularly check to see if handrails are loose or broken. Make sure that both sides of any steps have handrails.  Any raised decks and porches should have guardrails on the edges.  Have any leaves, snow, or ice cleared regularly.  Use sand or salt on walking paths during winter.  Clean up any spills in your garage right away. This includes oil or grease spills. What can I do in the bathroom?  Use night lights.  Install grab bars by the toilet and in the tub and shower. Do not use towel bars as grab bars.  Use non-skid mats or decals in the tub or shower.  If you need to sit down in the shower, use a plastic, non-slip stool.  Keep the floor dry. Clean up any water that spills on the floor as soon as it happens.  Remove soap buildup in the tub or shower regularly.  Attach bath mats securely with double-sided non-slip rug tape.  Do not have throw rugs and other things on the floor that can make you trip. What can I do in the bedroom?  Use night lights.  Make sure that you have a light by your bed that is easy to reach.  Do not use any sheets or blankets that are too big for your bed. They should not hang down onto the floor.  Have a firm chair that has side arms. You can use this for support while you get dressed.  Do not have throw rugs and other things on the floor that can make you trip. What can  I do in the kitchen?  Clean up any spills right away.  Avoid walking on wet floors.  Keep items that you use a lot in easy-to-reach places.  If you need to reach something above you, use a strong step stool that has a grab bar.  Keep electrical cords out of the way.  Do not use floor polish or wax that makes floors slippery. If you must use wax, use non-skid floor wax.  Do not have throw rugs and other things on the floor that can make you trip. What can I do with my stairs?  Do not leave any items on the stairs.  Make sure that there are handrails on both sides of the stairs and use them. Fix handrails that are broken or loose. Make sure that handrails are as long as the stairways.  Check any carpeting to make sure that it is firmly attached to the stairs. Fix any carpet that is loose or worn.  Avoid having throw rugs at the top or bottom of the stairs. If you do have throw rugs, attach them to the floor with carpet tape.  Make sure that you have a light switch at the top of the stairs and the bottom of the stairs. If you  do not have them, ask someone to add them for you. What else can I do to help prevent falls?  Wear shoes that:  Do not have high heels.  Have rubber bottoms.  Are comfortable and fit you well.  Are closed at the toe. Do not wear sandals.  If you use a stepladder:  Make sure that it is fully opened. Do not climb a closed stepladder.  Make sure that both sides of the stepladder are locked into place.  Ask someone to hold it for you, if possible.  Clearly mark and make sure that you can see:  Any grab bars or handrails.  First and last steps.  Where the edge of each step is.  Use tools that help you move around (mobility aids) if they are needed. These include:  Canes.  Walkers.  Scooters.  Crutches.  Turn on the lights when you go into a dark area. Replace any light bulbs as soon as they burn out.  Set up your furniture so you have a  clear path. Avoid moving your furniture around.  If any of your floors are uneven, fix them.  If there are any pets around you, be aware of where they are.  Review your medicines with your doctor. Some medicines can make you feel dizzy. This can increase your chance of falling. Ask your doctor what other things that you can do to help prevent falls. This information is not intended to replace advice given to you by your health care provider. Make sure you discuss any questions you have with your health care provider. Document Released: 01/16/2009 Document Revised: 08/28/2015 Document Reviewed: 04/26/2014  2017 Elsevier

## 2016-05-04 LAB — VITAMIN D 25 HYDROXY (VIT D DEFICIENCY, FRACTURES): Vit D, 25-Hydroxy: 43 ng/mL (ref 30.0–100.0)

## 2016-05-21 NOTE — Progress Notes (Signed)
Lindsay Stephenson was seen today in the movement disorders clinic for neurologic consultation at the request of Rufina Falco, NP at [facility].  Her PCP is Otilio Miu, MD.  This patient is accompanied in the office by her spouse who supplements the history.   The consultation is for the evaluation of PD.  The records that were made available to me were reviewed.  The patient was first seen by [facility] neurology in July, 2017, but that visit was primarily for vertigo.  No mention was made in that note of tremor or any parkinsonian symptoms on physical exam.  The patient does state that she first noted symptoms in 2016 and states that her first symptom was right hand tremor.  L hand tremor started about a year later.  She was just placed on levodopa in January, 2018.  She isn't sure it is helping but it may be but "I'm not sure what it is supposed to help."  Specific Symptoms:  Tremor: Yes.  , started in R hand, now in the L (worse with stress, fatigue, nervous; worse at rest) Family hx of similar:  No. Voice: no change Sleep: sleep well  Vivid Dreams:  Yes.    Acting out dreams:  Occasional yells but has CPAP Postural symptoms:  Yes.    Falls?  Yes.   (1 time per month since 08/2015 - most of them due to tripping over things; some she feels like being pulled back or to the right and will fall; no fx with falls) Bradykinesia symptoms: difficulty getting out of a chair and difficulty regaining balance Loss of smell:  Perhaps a little Loss of taste:  No. Urinary Incontinence:  No. Difficulty Swallowing:  No. Handwriting, micrographia: Yes.   (still pretty big but smaller than in the past) Trouble with ADL's:  No. (slower than in the past but able to do it)  Trouble buttoning clothing: No. Depression:  No. Memory changes:  Yes.  , mild trouble and trouble with finding words Hallucinations:  No.  visual distortions: No. N/V:  No. Lightheaded:  Yes.  , especially if gets up too fast  (and has had vertigo since college)  Syncope: No. Diplopia:  No. but some blurry vision Dyskinesia:  No.  Neuroimaging has previously been performed.  It is available for my review today.  She had MRI brain on 10/01/15  PREVIOUS MEDICATIONS: Sinemet  ALLERGIES:   Allergies  Allergen Reactions  . Entex Lq [Phenylephrine-Guaifenesin] Swelling    TONGUE  . Codeine Hives and Other (See Comments)    Pt reports only having symptoms after receiving large amounts of medication  . Sulfa Antibiotics Itching  . Tape Other (See Comments)    CAN PULL OFF LAYER OF SKIN    CURRENT MEDICATIONS:  Outpatient Encounter Prescriptions as of 05/25/2016  Medication Sig  . albuterol (PROVENTIL HFA;VENTOLIN HFA) 108 (90 Base) MCG/ACT inhaler Inhale 1-2 puffs into the lungs every 6 (six) hours as needed for wheezing or shortness of breath.  Marland Kitchen amoxicillin (AMOXIL) 500 MG tablet Take 500 mg by mouth. Before dental work  . carbidopa-levodopa (SINEMET IR) 25-100 MG tablet Take 1 tablet by mouth 3 (three) times daily.  . Clobetasol Propionate 0.05 % shampoo   . dapsone 100 MG tablet 1 tablet daily. Derm IN AM  . ferrous sulfate 325 (65 FE) MG tablet Take 325 mg by mouth every other day.   . folic acid (FOLVITE) 1 MG tablet Take 1 mg by mouth  daily.  . levocetirizine (XYZAL) 5 MG tablet Take 5 mg by mouth every evening.  . Multiple Vitamin (MULTIVITAMIN) capsule Take 1 capsule by mouth daily.  Marland Kitchen omeprazole (PRILOSEC) 20 MG capsule Take 1 capsule (20 mg total) by mouth daily.  . polyethylene glycol (MIRALAX / GLYCOLAX) packet Take 17 g by mouth daily.  Marland Kitchen TRIANEX 0.05 % OINT   . VITAMIN B1-B12 IM Inject into the muscle every 30 (thirty) days.  . Vitamin D, Ergocalciferol, (DRISDOL) 50000 units CAPS capsule Take 1 capsule (50,000 Units total) by mouth once a week.   No facility-administered encounter medications on file as of 05/25/2016.     PAST MEDICAL HISTORY:   Past Medical History:  Diagnosis Date  .  Allergy   . Anemia    TAKING IRON  . Arthritis    KNEES AND HANDS  . Asthma    COUGHING, NO ATTACKS IN A YEAR OR 2  . Dyspnea    ON EXERTION  . GERD (gastroesophageal reflux disease)   . HOH (hard of hearing)    30-35 % HEARING LOSS  . Hyperlipidemia   . Hypertension    HX OF  . LABD (linear immunoglobulin A bullous dermatosis)   . Mitral valve prolapse   . Mitral valve prolapse   . 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 Parkinson's disease ([place])   . Rectal bleeding   . Sleep apnea    CPAP  . Vertigo     PAST SURGICAL HISTORY:   Past Surgical History:  Procedure Laterality Date  . ABDOMINAL HYSTERECTOMY    . APPENDECTOMY    . COLONOSCOPY WITH PROPOFOL N/A 01/16/2016   Procedure: COLONOSCOPY WITH PROPOFOL;  Surgeon: Lucilla Lame, MD;  Location: [facility];  Service: Endoscopy;  Laterality: N/A;  CPAP  . POLYPECTOMY  01/16/2016   Procedure: POLYPECTOMY;  Surgeon: Lucilla Lame, MD;  Location: [facility];  Service: Endoscopy;;  . REPLACEMENT TOTAL KNEE BILATERAL Bilateral    2013, 2014  . TONSILLECTOMY     ADENOIDECTOMY    SOCIAL HISTORY:   Social History   Social History  . Marital status: Married    Spouse name: N/A  . Number of children: N/A  . Years of education: N/A   Occupational History  . Not on file.   Social History Main Topics  . Smoking status: Former Smoker    Years: 3.00    Types: Cigarettes    Quit date: 1969  . Smokeless tobacco: Never Used  . Alcohol use No     Comment: 4-5 times a year  . Drug use: No  . Sexual activity: Not Currently   Other Topics Concern  . Not on file   Social History Narrative  . No narrative on file    FAMILY HISTORY:   Family Status  Relation Status  . Mother Alive  . Father Deceased  . Sister Alive  . Brother Alive  . Son Alive    ROS:  A complete 10 system review of systems was obtained and was unremarkable apart from what is mentioned  above.  PHYSICAL EXAMINATION:    VITALS:   Vitals:   05/25/16 1302  BP: 114/68  Pulse: 64  Weight: 201 lb (91.2 kg)  Height: 5' 1"  (1.549 m)    GEN:  The patient appears stated age and is in NAD. HEENT:  Normocephalic, atraumatic.  The mucous membranes are moist. The superficial temporal arteries are without ropiness  or tenderness. CV:  RRR Lungs:  CTAB Neck/HEME:  There are no carotid bruits bilaterally.  Neurological examination:  Orientation: The patient is alert and oriented x3. Fund of knowledge is appropriate.  Recent and remote memory are intact.  Attention and concentration are normal.    Able to name objects and repeat phrases. Cranial nerves: There is good facial symmetry. No facial hypomimia.  Pupils are equal round and reactive to light bilaterally. Fundoscopic exam reveals clear margins bilaterally. Extraocular muscles are intact. The visual fields are full to confrontational testing. The speech is fluent and clear.  No hypophonia.   Soft palate rises symmetrically and there is no tongue deviation. Hearing is intact to conversational tone. Sensation: Sensation is intact to light and pinprick throughout (facial, trunk, extremities). Vibration is intact at the bilateral big toe but it is certainly decreased compared to proximally. There is no extinction with double simultaneous stimulation. There is no sensory dermatomal level identified. Motor: Strength is 5/5 in the bilateral upper and lower extremities.   Shoulder shrug is equal and symmetric.  There is no pronator drift. Deep tendon reflexes: Deep tendon reflexes are 3-/4 at the bilateral biceps, triceps, brachioradialis, patella and 1/4 at the bilateral achilles. Plantar responses are downgoing bilaterally.  Movement examination: Tone: There is normal tone in the bilateral upper extremities.  The tone in the lower extremities is normal.  Abnormal movements: There is no rest tremor even with distraction.  There is mild  tremor of the outstretched hands that is somewhat worse with intention.  The right is worse than the left. Coordination:  There is no decremation with RAM's, with any form of RAMS, including alternating supination and pronation of the forearm, hand opening and closing, finger taps, heel taps and toe taps. Gait and Station: The patient has difficulty arising out of a deep-seated chair without the use of the hands.  Her gait is very wide-based and antalgic.  She does not shuffle, but is short stepped.  She is unsteady.    Labs:  I reviewed lab work dated 03/01/2016.  Sodium was 143, potassium 4.0, chloride 108, CO2 27, BUN 15 and creatinine 0.4.  B12 was 199.  Folate greater than 22.3.  Sedimentation rate was 10.  ASSESSMENT/PLAN:  1.  Tremor  -I told the patient today that I saw no evidence of idiopathic Parkinson's disease, but also told her that it could be covered up by the medication that she was on, as she did take her levodopa today.  She did have a small degree of postural tremor, but otherwise had no rigidity, no bradykinesia and while she was unstable, it was not a gait instability consistent with Parkinson's disease.  I did tell her that I should examine her the next time off of her Parkinson's medication, just to make sure that findings are not drastically different.  -We'll do a TSH.  2.  Peripheral neuropathy  -The patient has clinical examination evidence of a diffuse peripheral neuropathy, which certainly can affect gait and balance.  We discussed safety associated with peripheral neuropathy.  She has been to physical therapy already.  She uses a cane, although I think she could probably use a walker.  -some of her gait abnormality may be structural in nature and that may need to be further explored as well.  -states that she had labs and was told that she had b12 deficiency of 199.  Is now on injections.    -glucose fasting was 98  -do SPEP/UPEP  with [facility]  -she asked me about  seeing a neuromuscular specialist but want to hold on that until examine her next visit off of levodopa  3.  F/u in next month, when off of levodopa.  Much greater than 50% of this visit was spent in counseling and coordinating care.  Total face to face time:  45 min    Cc:  Otilio Miu, MD

## 2016-05-25 ENCOUNTER — Ambulatory Visit (INDEPENDENT_AMBULATORY_CARE_PROVIDER_SITE_OTHER): Payer: Medicare HMO | Admitting: Neurology

## 2016-05-25 ENCOUNTER — Encounter: Payer: Self-pay | Admitting: Neurology

## 2016-05-25 VITALS — BP 114/68 | HR 64 | Ht 61.0 in | Wt 201.0 lb

## 2016-05-25 DIAGNOSIS — R251 Tremor, unspecified: Secondary | ICD-10-CM | POA: Diagnosis not present

## 2016-05-25 DIAGNOSIS — G609 Hereditary and idiopathic neuropathy, unspecified: Secondary | ICD-10-CM | POA: Diagnosis not present

## 2016-05-25 LAB — TSH: TSH: 1.03 m[IU]/L

## 2016-05-25 NOTE — Patient Instructions (Signed)
1. We have scheduled your follow up appt for 07/07/2016 at 10:15 am. Do not take your Carbidopa Levodopa 24 hours prior to this appointment.

## 2016-05-27 LAB — PROTEIN ELECTROPHORESIS,RANDOM URN
CREATININE, URINE: 47 mg/dL (ref 20–320)
PROTEIN CREATININE RATIO: 213 mg/g{creat} — AB (ref 21–161)
TOTAL PROTEIN, URINE: 10 mg/dL (ref 5–24)

## 2016-05-27 LAB — PROTEIN ELECTROPHORESIS, SERUM
ALPHA-1-GLOBULIN: 0.3 g/dL (ref 0.2–0.3)
Albumin ELP: 4 g/dL (ref 3.8–4.8)
Alpha-2-Globulin: 0.5 g/dL (ref 0.5–0.9)
BETA 2: 0.3 g/dL (ref 0.2–0.5)
BETA GLOBULIN: 0.4 g/dL (ref 0.4–0.6)
GAMMA GLOBULIN: 0.7 g/dL — AB (ref 0.8–1.7)
TOTAL PROTEIN, SERUM ELECTROPHOR: 6.3 g/dL (ref 6.1–8.1)

## 2016-05-27 LAB — IMMUNOFIXATION ELECTROPHORESIS
IGM, SERUM: 121 mg/dL (ref 48–271)
IgA: 202 mg/dL (ref 81–463)
IgG (Immunoglobin G), Serum: 669 mg/dL — ABNORMAL LOW (ref 694–1618)

## 2016-05-27 LAB — IMMUNOFIXATION INTE

## 2016-05-28 DIAGNOSIS — G4733 Obstructive sleep apnea (adult) (pediatric): Secondary | ICD-10-CM | POA: Diagnosis not present

## 2016-05-31 ENCOUNTER — Telehealth: Payer: Self-pay | Admitting: *Deleted

## 2016-05-31 NOTE — Telephone Encounter (Signed)
-----   Message from Shepherd, DO sent at 05/27/2016  3:25 PM EST ----- Let pt know that labs are okay

## 2016-05-31 NOTE — Telephone Encounter (Signed)
Left message giving patient results.  

## 2016-06-01 ENCOUNTER — Ambulatory Visit (INDEPENDENT_AMBULATORY_CARE_PROVIDER_SITE_OTHER): Payer: Medicare HMO

## 2016-06-01 DIAGNOSIS — E538 Deficiency of other specified B group vitamins: Secondary | ICD-10-CM

## 2016-06-01 MED ORDER — CYANOCOBALAMIN 1000 MCG/ML IJ SOLN
1000.0000 ug | Freq: Once | INTRAMUSCULAR | Status: AC
  Administered 2016-06-01: 1000 ug via INTRAMUSCULAR

## 2016-06-29 ENCOUNTER — Telehealth: Payer: Self-pay

## 2016-06-29 ENCOUNTER — Ambulatory Visit (INDEPENDENT_AMBULATORY_CARE_PROVIDER_SITE_OTHER): Payer: Medicare HMO

## 2016-06-29 ENCOUNTER — Other Ambulatory Visit: Payer: Self-pay

## 2016-06-29 DIAGNOSIS — M810 Age-related osteoporosis without current pathological fracture: Secondary | ICD-10-CM

## 2016-06-29 DIAGNOSIS — E538 Deficiency of other specified B group vitamins: Secondary | ICD-10-CM

## 2016-06-29 DIAGNOSIS — Z1239 Encounter for other screening for malignant neoplasm of breast: Secondary | ICD-10-CM

## 2016-06-29 MED ORDER — CYANOCOBALAMIN 1000 MCG/ML IJ SOLN
1000.0000 ug | Freq: Once | INTRAMUSCULAR | Status: AC
  Administered 2016-06-29: 1000 ug via INTRAMUSCULAR

## 2016-06-29 NOTE — Telephone Encounter (Signed)
mammo and bone density ordered

## 2016-06-29 NOTE — Telephone Encounter (Signed)
Gave pt B12 shot this morning, and she asked me to tell Dr. Ronnald Ramp nurse to see if you received her records from [facility]. If not please call her and let her know. She said she needs bone density scan, and mammo ordered.

## 2016-07-06 NOTE — Progress Notes (Signed)
Lindsay Stephenson was seen today in the movement disorders clinic for neurologic consultation at the request of Rufina Falco, NP at [facility].  Her PCP is Otilio Miu, MD.  This patient is accompanied in the office by her spouse who supplements the history.   The consultation is for the evaluation of PD.  The records that were made available to me were reviewed.  The patient was first seen by [facility] neurology in July, 2017, but that visit was primarily for vertigo.  No mention was made in that note of tremor or any parkinsonian symptoms on physical exam.  The patient does state that she first noted symptoms in 2016 and states that her first symptom was right hand tremor.  L hand tremor started about a year later.  She was just placed on levodopa in January, 2018.  She isn't sure it is helping but it may be but "I'm not sure what it is supposed to help."  07/07/16 update:  Patient presents today for follow-up.  I asked her to hold her levodopa before today's visit.  She last took it on Sunday (it is Wednesday).   She has had slightly more tremor off med but otherwise doing well.    She did get a walker for her birthday and is doing really well with that.  She did fall 06/03/16 and her husband offered her the walker but she opted not to take it.  She fell flat on her face.  She has been using walker when out ever since and it has helped.  She states that she had her gait is examined while she was in [place] and they did not think that it was a knee issue.  She did not have her hip evaluated, but is having hip pain.  She asks me about a handicap placard.  Pt denies lightheadedness, near syncope.  No hallucinations.  Mood has been good.  She is getting B12 injections.  She asks me about getting a level.  She has been on them for 6 months now.  Neuroimaging has previously been performed.  It is available for my review today.  She had MRI brain on 10/01/15  PREVIOUS MEDICATIONS: Sinemet  ALLERGIES:     Allergies  Allergen Reactions  . Entex Lq [Phenylephrine-Guaifenesin] Swelling    TONGUE  . Codeine Hives and Other (See Comments)    Pt reports only having symptoms after receiving large amounts of medication  . Sulfa Antibiotics Itching  . Tape Other (See Comments)    CAN PULL OFF LAYER OF SKIN    CURRENT MEDICATIONS:  Outpatient Encounter Prescriptions as of 07/07/2016  Medication Sig  . albuterol (PROVENTIL HFA;VENTOLIN HFA) 108 (90 Base) MCG/ACT inhaler Inhale 1-2 puffs into the lungs every 6 (six) hours as needed for wheezing or shortness of breath.  Marland Kitchen amoxicillin (AMOXIL) 500 MG tablet Take 500 mg by mouth. Before dental work  . carbidopa-levodopa (SINEMET IR) 25-100 MG tablet Take 1 tablet by mouth 3 (three) times daily.  . Clobetasol Propionate 0.05 % shampoo   . dapsone 100 MG tablet 1 tablet daily. Derm IN AM  . ferrous sulfate 325 (65 FE) MG tablet Take 325 mg by mouth every other day.   . folic acid (FOLVITE) 1 MG tablet Take 1 mg by mouth daily.  Marland Kitchen levocetirizine (XYZAL) 5 MG tablet Take 5 mg by mouth every evening.  . Multiple Vitamin (MULTIVITAMIN) capsule Take 1 capsule by mouth daily.  Marland Kitchen omeprazole (PRILOSEC) 20  MG capsule Take 1 capsule (20 mg total) by mouth daily.  . polyethylene glycol (MIRALAX / GLYCOLAX) packet Take 17 g by mouth daily.  Marland Kitchen TRIANEX 0.05 % OINT   . VITAMIN B1-B12 IM Inject into the muscle every 30 (thirty) days.  . Vitamin D, Ergocalciferol, (DRISDOL) 50000 units CAPS capsule Take 1 capsule (50,000 Units total) by mouth once a week.   No facility-administered encounter medications on file as of 07/07/2016.     PAST MEDICAL HISTORY:   Past Medical History:  Diagnosis Date  . Allergy   . Anemia    TAKING IRON  . Arthritis    KNEES AND HANDS  . Asthma    COUGHING, NO ATTACKS IN A YEAR OR 2  . Dyspnea    ON EXERTION  . GERD (gastroesophageal reflux disease)   . HOH (hard of hearing)    30-35 % HEARING LOSS  . Hyperlipidemia   .  Hypertension    HX OF  . LABD (linear immunoglobulin A bullous dermatosis)   . Mitral valve prolapse   . Mitral valve prolapse   . 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 Parkinson's disease ([place])   . Rectal bleeding   . Sleep apnea    CPAP  . Vertigo     PAST SURGICAL HISTORY:   Past Surgical History:  Procedure Laterality Date  . ABDOMINAL HYSTERECTOMY    . APPENDECTOMY    . COLONOSCOPY WITH PROPOFOL N/A 01/16/2016   Procedure: COLONOSCOPY WITH PROPOFOL;  Surgeon: Lucilla Lame, MD;  Location: [place];  Service: Endoscopy;  Laterality: N/A;  CPAP  . POLYPECTOMY  01/16/2016   Procedure: POLYPECTOMY;  Surgeon: Lucilla Lame, MD;  Location: [place];  Service: Endoscopy;;  . REPLACEMENT TOTAL KNEE BILATERAL Bilateral    2013, 2014  . TONSILLECTOMY     ADENOIDECTOMY    SOCIAL HISTORY:   Social History   Social History  . Marital status: Married    Spouse name: N/A  . Number of children: N/A  . Years of education: N/A   Occupational History  . retired     high school french   Social History Main Topics  . Smoking status: Former Smoker    Years: 3.00    Types: Cigarettes    Quit date: 1969  . Smokeless tobacco: Never Used  . Alcohol use No     Comment: 4-5 times a year  . Drug use: No  . Sexual activity: Not Currently   Other Topics Concern  . Not on file   Social History Narrative  . No narrative on file    FAMILY HISTORY:   Family Status  Relation Status  . Mother Alive  . Father Deceased  . Sister Alive  . Brother Alive  . Son Alive    ROS:  Admits to some neck pain.  A complete 10 system review of systems was obtained and was unremarkable apart from what is mentioned above.  PHYSICAL EXAMINATION:    VITALS:   Vitals:   07/07/16 0943  BP: 120/64  Pulse: 100  Weight: 202 lb (91.6 kg)  Height: 5' 1"  (1.549 m)    GEN:  The patient appears stated age and is in NAD. HEENT:   Normocephalic, atraumatic.  The mucous membranes are moist. The superficial temporal arteries are without ropiness or tenderness. CV:  Mildly tachycardic.  Regular rhythm. Lungs:  CTAB Neck/HEME:  There are no carotid bruits bilaterally.  Neurological examination:  Orientation: The patient is alert and oriented x3.  Cranial nerves: There is good facial symmetry. No facial hypomimia.   Extraocular muscles are intact. The visual fields are full to confrontational testing. The speech is fluent and clear.  No hypophonia.   Soft palate rises symmetrically and there is no tongue deviation. Hearing is intact to conversational tone. Sensation: Sensation is intact to light and pinprick throughout (facial, trunk, extremities). Vibration is intact at the bilateral big toe but it is certainly decreased compared to proximally. There is no extinction with double simultaneous stimulation. There is no sensory dermatomal level identified. Motor: Strength is 5/5 in the bilateral upper and lower extremities.   Shoulder shrug is equal and symmetric.  There is no pronator drift. Deep tendon reflexes: Deep tendon reflexes are 3-/4 at the bilateral biceps, triceps, brachioradialis, patella and 1/4 at the bilateral achilles. Plantar responses are downgoing bilaterally.  Movement examination: Tone: There is normal tone in the bilateral upper extremities.  The tone in the lower extremities is normal.  Abnormal movements: There is no rest tremor even with distraction.  There is mild tremor of the outstretched hands that is somewhat worse with intention.   Coordination:  There is slow heel taps at the bilateral LE's.  Good RAMs in the UE bilaterally.   Gait and Station: The patient has difficulty arising out of a deep-seated chair without the use of the hands.  She has to push off of the chair.  Her gait is very wide-based and antalgic.  She does not shuffle, but is short stepped.  She "waddles" somewhat.    Labs:  I  reviewed lab work dated 03/01/2016.  Sodium was 143, potassium 4.0, chloride 108, CO2 27, BUN 15 and creatinine 0.4.  B12 was 199.  Folate greater than 22.3.  Sedimentation rate was 10.  ASSESSMENT/PLAN:  1.  Gait instability  -does not meet criteria for PD.  D/c levodopa.    -The patient has clinical examination evidence of a diffuse peripheral neuropathy, which certainly can affect gait and balance.  I am not convinced that this is the entire issue, however.    -some of her gait abnormality may be structural in nature and that may need to be further explored as well.  Will refer to ortho for evaluation of hips.  -states that she had labs and was told that she had b12 deficiency of 199.  Is now on injections.  Would like to repeat at trough level and ordered that.  -she asked me about seeing a neuromuscular specialist and Dr. Posey Pronto agreeable.  -handicap placard filled out  -asked me about driving.  Gave her the number for [employer] company but I do think that she is able to drive from a neurologic standpoint  -MRI cervical spine given mild hyperreflexia in face of PN  3.  F/u in 6 months, sooner should new neurologic issues today.  Much greater than 50% of this visit was spent in counseling and coordinating care.  Total face to face time:  30 min     Cc:  Otilio Miu, MD

## 2016-07-07 ENCOUNTER — Encounter: Payer: Self-pay | Admitting: Neurology

## 2016-07-07 ENCOUNTER — Telehealth: Payer: Self-pay | Admitting: Neurology

## 2016-07-07 ENCOUNTER — Ambulatory Visit (INDEPENDENT_AMBULATORY_CARE_PROVIDER_SITE_OTHER): Payer: Medicare HMO | Admitting: Neurology

## 2016-07-07 VITALS — BP 120/64 | HR 100 | Ht 61.0 in | Wt 202.0 lb

## 2016-07-07 DIAGNOSIS — R292 Abnormal reflex: Secondary | ICD-10-CM

## 2016-07-07 DIAGNOSIS — M25552 Pain in left hip: Secondary | ICD-10-CM | POA: Diagnosis not present

## 2016-07-07 DIAGNOSIS — G609 Hereditary and idiopathic neuropathy, unspecified: Secondary | ICD-10-CM | POA: Diagnosis not present

## 2016-07-07 DIAGNOSIS — R2681 Unsteadiness on feet: Secondary | ICD-10-CM | POA: Diagnosis not present

## 2016-07-07 DIAGNOSIS — M542 Cervicalgia: Secondary | ICD-10-CM | POA: Diagnosis not present

## 2016-07-07 DIAGNOSIS — E538 Deficiency of other specified B group vitamins: Secondary | ICD-10-CM

## 2016-07-07 DIAGNOSIS — M25551 Pain in right hip: Secondary | ICD-10-CM | POA: Diagnosis not present

## 2016-07-07 NOTE — Patient Instructions (Addendum)
1. If you are interested in the driving assessment, you can contact [employer] in [place]. [phone].  2. Before your next B12 injection please have B12 level drawn. Lab requisition given. You can take this to [facility] in [place].   3. We have scheduled you at [facility] for your MRI on 07/17/16 at 11:00 am. Please arrive 30 minutes prior and go to radiology. If you need to reschedule for any reason please call [phone].  4. We will refer you to see orthopaedics about your hip prior to appt with Dr. Posey Pronto.

## 2016-07-07 NOTE — Telephone Encounter (Signed)
Referral faxed to [facility] at [phone] with confirmation received. They will call patient to schedule appt in the [place] office.

## 2016-07-12 DIAGNOSIS — M25552 Pain in left hip: Secondary | ICD-10-CM | POA: Diagnosis not present

## 2016-07-12 DIAGNOSIS — M25551 Pain in right hip: Secondary | ICD-10-CM | POA: Diagnosis not present

## 2016-07-13 ENCOUNTER — Telehealth: Payer: Self-pay | Admitting: Neurology

## 2016-07-13 ENCOUNTER — Ambulatory Visit
Admission: RE | Admit: 2016-07-13 | Discharge: 2016-07-13 | Disposition: A | Discharge status: Home / Self Care | Payer: Medicare HMO | Source: Ambulatory Visit | Attending: Family Medicine | Admitting: Family Medicine

## 2016-07-13 DIAGNOSIS — E538 Deficiency of other specified B group vitamins: Secondary | ICD-10-CM

## 2016-07-13 DIAGNOSIS — Z1231 Encounter for screening mammogram for malignant neoplasm of breast: Secondary | ICD-10-CM | POA: Diagnosis not present

## 2016-07-13 DIAGNOSIS — M810 Age-related osteoporosis without current pathological fracture: Secondary | ICD-10-CM | POA: Diagnosis not present

## 2016-07-13 DIAGNOSIS — M8588 Other specified disorders of bone density and structure, other site: Secondary | ICD-10-CM | POA: Insufficient documentation

## 2016-07-13 DIAGNOSIS — Z1239 Encounter for other screening for malignant neoplasm of breast: Secondary | ICD-10-CM

## 2016-07-13 NOTE — Telephone Encounter (Signed)
Patient states that she had her hips looked and they are are ok she just wanted to know

## 2016-07-13 NOTE — Telephone Encounter (Signed)
Dr. Carles Collet Juluis Rainier. Sounds like hip is okay.

## 2016-07-13 NOTE — Telephone Encounter (Signed)
Thank you :)

## 2016-07-16 DIAGNOSIS — L138 Other specified bullous disorders: Secondary | ICD-10-CM | POA: Diagnosis not present

## 2016-07-17 ENCOUNTER — Ambulatory Visit
Admission: RE | Admit: 2016-07-17 | Discharge: 2016-07-17 | Disposition: A | Discharge status: Home / Self Care | Payer: Medicare HMO | Source: Ambulatory Visit | Attending: Neurology | Admitting: Neurology

## 2016-07-17 DIAGNOSIS — R292 Abnormal reflex: Secondary | ICD-10-CM | POA: Diagnosis not present

## 2016-07-17 DIAGNOSIS — R2681 Unsteadiness on feet: Secondary | ICD-10-CM | POA: Diagnosis not present

## 2016-07-17 DIAGNOSIS — M50323 Other cervical disc degeneration at C6-C7 level: Secondary | ICD-10-CM | POA: Diagnosis not present

## 2016-07-17 DIAGNOSIS — M47812 Spondylosis without myelopathy or radiculopathy, cervical region: Secondary | ICD-10-CM | POA: Diagnosis not present

## 2016-07-17 DIAGNOSIS — M542 Cervicalgia: Secondary | ICD-10-CM

## 2016-07-17 DIAGNOSIS — S199XXA Unspecified injury of neck, initial encounter: Secondary | ICD-10-CM | POA: Diagnosis not present

## 2016-07-19 ENCOUNTER — Other Ambulatory Visit: Payer: Self-pay

## 2016-07-19 ENCOUNTER — Telehealth: Payer: Self-pay | Admitting: Neurology

## 2016-07-19 NOTE — Telephone Encounter (Signed)
-----   Message from [place], DO sent at 07/19/2016 10:15 AM EDT ----- Reviewed.  Subluxation noted at c4-5.  Multiple areas of disc bulging.  Don't see [place]  signal change.  Let pt know results and happy to refer to neurosx but I think that she has appt with Dr. Posey Pronto for 2nd opinion and may be valuable to get her opinion first

## 2016-07-19 NOTE — Telephone Encounter (Signed)
Left message on machine for patient to call back.

## 2016-07-19 NOTE — Telephone Encounter (Signed)
Spoke with patient. She was made aware of results.  She will go ahead and have referral sent to [facility] to talk with them.  Referral faxed to [facility] at [phone] with confirmation received. They will contact the patient to schedule.

## 2016-08-02 ENCOUNTER — Other Ambulatory Visit: Payer: Self-pay | Admitting: Neurology

## 2016-08-02 DIAGNOSIS — L1089 Other pemphigus: Secondary | ICD-10-CM | POA: Diagnosis not present

## 2016-08-02 DIAGNOSIS — M542 Cervicalgia: Secondary | ICD-10-CM | POA: Diagnosis not present

## 2016-08-02 DIAGNOSIS — R2681 Unsteadiness on feet: Secondary | ICD-10-CM | POA: Diagnosis not present

## 2016-08-02 DIAGNOSIS — R292 Abnormal reflex: Secondary | ICD-10-CM | POA: Diagnosis not present

## 2016-08-03 ENCOUNTER — Telehealth: Payer: Self-pay | Admitting: Neurology

## 2016-08-03 LAB — VITAMIN B12: VITAMIN B 12: 470 pg/mL (ref 232–1245)

## 2016-08-03 NOTE — Telephone Encounter (Signed)
Patient made aware B12 level normal.

## 2016-08-03 NOTE — Telephone Encounter (Signed)
-----   Message from [place], DO sent at 08/03/2016  2:00 PM EDT ----- Let pt know that B12 level is now normal

## 2016-08-04 ENCOUNTER — Other Ambulatory Visit: Payer: Self-pay | Admitting: Family Medicine

## 2016-08-04 ENCOUNTER — Ambulatory Visit (INDEPENDENT_AMBULATORY_CARE_PROVIDER_SITE_OTHER): Payer: Medicare HMO

## 2016-08-04 DIAGNOSIS — M791 Myalgia, unspecified site: Secondary | ICD-10-CM

## 2016-08-04 DIAGNOSIS — E538 Deficiency of other specified B group vitamins: Secondary | ICD-10-CM

## 2016-08-04 DIAGNOSIS — G629 Polyneuropathy, unspecified: Secondary | ICD-10-CM | POA: Diagnosis not present

## 2016-08-04 MED ORDER — CYANOCOBALAMIN 1000 MCG/ML IJ SOLN
1000.0000 ug | Freq: Once | INTRAMUSCULAR | Status: AC
  Administered 2016-08-04: 1000 ug via INTRAMUSCULAR

## 2016-08-04 NOTE — Addendum Note (Signed)
Addended by: Fredderick Severance on: 08/04/2016 09:41 AM   Modules accepted: Orders

## 2016-08-05 LAB — LYME DISEASE, WESTERN BLOT
IGG P18 AB.: ABSENT
IGG P30 AB.: ABSENT
IGG P39 AB.: ABSENT
IGG P45 AB.: ABSENT
IGG P58 AB.: ABSENT
IGG P93 AB.: ABSENT
IGM P23 AB.: ABSENT
IgG P23 Ab.: ABSENT
IgG P28 Ab.: ABSENT
IgG P66 Ab.: ABSENT
IgM P39 Ab.: ABSENT
IgM P41 Ab.: ABSENT
Lyme IgG Wb: NEGATIVE
Lyme IgM Wb: NEGATIVE

## 2016-09-01 DIAGNOSIS — M4802 Spinal stenosis, cervical region: Secondary | ICD-10-CM | POA: Diagnosis not present

## 2016-09-01 DIAGNOSIS — R030 Elevated blood-pressure reading, without diagnosis of hypertension: Secondary | ICD-10-CM | POA: Diagnosis not present

## 2016-09-01 DIAGNOSIS — R251 Tremor, unspecified: Secondary | ICD-10-CM | POA: Diagnosis not present

## 2016-09-01 DIAGNOSIS — M542 Cervicalgia: Secondary | ICD-10-CM | POA: Diagnosis not present

## 2016-09-01 DIAGNOSIS — G629 Polyneuropathy, unspecified: Secondary | ICD-10-CM | POA: Diagnosis not present

## 2016-09-01 DIAGNOSIS — L138 Other specified bullous disorders: Secondary | ICD-10-CM | POA: Diagnosis not present

## 2016-09-01 DIAGNOSIS — Z6841 Body Mass Index (BMI) 40.0 and over, adult: Secondary | ICD-10-CM | POA: Diagnosis not present

## 2016-09-01 DIAGNOSIS — M47812 Spondylosis without myelopathy or radiculopathy, cervical region: Secondary | ICD-10-CM | POA: Diagnosis not present

## 2016-09-09 ENCOUNTER — Other Ambulatory Visit (INDEPENDENT_AMBULATORY_CARE_PROVIDER_SITE_OTHER): Payer: Medicare HMO

## 2016-09-09 DIAGNOSIS — E538 Deficiency of other specified B group vitamins: Secondary | ICD-10-CM | POA: Diagnosis not present

## 2016-09-09 DIAGNOSIS — G4733 Obstructive sleep apnea (adult) (pediatric): Secondary | ICD-10-CM | POA: Diagnosis not present

## 2016-09-09 MED ORDER — CYANOCOBALAMIN 1000 MCG/ML IJ SOLN
1000.0000 ug | Freq: Once | INTRAMUSCULAR | Status: AC
  Administered 2016-09-09: 1000 ug via INTRAMUSCULAR

## 2016-09-09 NOTE — Progress Notes (Signed)
Central Neurology Division Clinic Note - Initial Visit   Date: 09/10/16  Lindsay Stephenson MRN: [id number] DOB: [birth year]   Dear Dr. Carles Collet:  Thank you for your kind referral of Lindsay Stephenson for consultation of neuropathy. Although her history is well known to you, please allow [place] to reiterate it for the purpose of our medical record. The patient was accompanied to the clinic by husband who also provides collateral information.     History of Present Illness: Lindsay Stephenson is a 71 y.o. 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    She was started on dapsone in 2016 for linear IgA bullous dermatosa.  She moved from [place] to [facility] in May 2017.  Around this same time, she started having increased falls, developed progressive weakness of the legs, burning/stabbing pain of the legs, and falls.  She began using a cane in September 2017 and a rollator walker in March 2018.  Since using a walker, she has only fallen once.  She has constant severe pain of the lower legs, described as burning, stabbing, knife-like.  Because of the severity of her weakness, she is unable to raise the legs which was initially mistaken for Parkinson's gait.  She saw Dr. Carles Collet (movement disorder specialist) who stopped sinemet and did not see that she has Parkinson's. For her hip pain, she saw Dr. French Ana (ortho) who noted very mild degenerative changes of the hips and did not see that her gait difficulty with stemming from a primary orthopeadic problem.  Most recently, she saw Dr. Vertell Limber (neurosurgeon) for multilevel cervical canal stenosis and he recommended to follow symptoms clinically and continue further neurological work-up for neuropathy.  Of note, she reports having tingling sensation of the lateral thigh for 10 years.  She admits to always being overweight.  Out-side paper records, electronic medical record, and images have been  reviewed where available and summarized as:  MRI cervical spine wo contrast 07/17/2016: Cervical spondylosis and degenerative disc disease, causing moderate impingement at C4-5, C5-6, and C6-7 ; and mild impingement at C2-3, as detailed above.  MRI lumbar spine wo contrast 03/16/2016: 1. At L3-4 there is a broad-based disc osteophyte complex. Mild bilateral facet arthropathy. Bilateral lateral recess stenosis. Bilateral mild foraminal narrowing. Mild central canal narrowing. 2. At L4-5 there is a broad-based disc bulge with a small central disc protrusion. Bilateral facet arthropathy. Bilateral lateral recess narrowing. Mild bilateral foraminal stenosis. Mild bilateral facet arthropathy.   Labs 08/18/2015:  SPEP no M protein  Lab Results  Component Value Date   TSH 1.03 05/25/2016   Lab Results  Component Value Date   VITAMINB12 470 08/02/2016    Past Medical History:  Diagnosis Date  . Allergy   . Anemia    TAKING IRON  . Arthritis    KNEES AND HANDS  . Asthma    COUGHING, NO ATTACKS IN A YEAR OR 2  . Dyspnea    ON EXERTION  . GERD (gastroesophageal reflux disease)   . HOH (hard of hearing)    30-35 % HEARING LOSS  . Hyperlipidemia   . Hypertension    HX OF  . LABD (linear immunoglobulin A bullous dermatosis)   . Mitral valve prolapse   . Mitral valve prolapse   . 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 Parkinson's disease ([place])   . Rectal bleeding   . Sleep apnea  CPAP  . Vertigo     Past Surgical History:  Procedure Laterality Date  . ABDOMINAL HYSTERECTOMY    . APPENDECTOMY    . COLONOSCOPY WITH PROPOFOL N/A 01/16/2016   Procedure: COLONOSCOPY WITH PROPOFOL;  Surgeon: Lucilla Lame, MD;  Location: [place];  Service: Endoscopy;  Laterality: N/A;  CPAP  . POLYPECTOMY  01/16/2016   Procedure: POLYPECTOMY;  Surgeon: Lucilla Lame, MD;  Location: [place];  Service: Endoscopy;;  . REPLACEMENT TOTAL KNEE  BILATERAL Bilateral    2013, 2014  . TONSILLECTOMY     ADENOIDECTOMY     Medications:  Outpatient Encounter Prescriptions as of 09/10/2016  Medication Sig Note  . albuterol (PROVENTIL HFA;VENTOLIN HFA) 108 (90 Base) MCG/ACT inhaler Inhale 1-2 puffs into the lungs every 6 (six) hours as needed for wheezing or shortness of breath.   Marland Kitchen amoxicillin (AMOXIL) 500 MG tablet Take 500 mg by mouth. Before dental work   . Clobetasol Propionate 0.05 % shampoo  02/04/2016: Received from: External Pharmacy  . dapsone 100 MG tablet 1 tablet daily. Derm IN AM 09/30/2015: Received from: [facility]:   . ferrous sulfate 325 (65 FE) MG tablet Take 325 mg by mouth every other day.    . Multiple Vitamin (MULTIVITAMIN) capsule Take 1 capsule by mouth daily.   Marland Kitchen omeprazole (PRILOSEC) 20 MG capsule TAKE 1 CAPSULE DAILY   . polyethylene glycol (MIRALAX / GLYCOLAX) packet Take 17 g by mouth daily.   . [DISCONTINUED] carbidopa-levodopa (SINEMET IR) 25-100 MG tablet Take 1 tablet by mouth 3 (three) times daily.   . [DISCONTINUED] folic acid (FOLVITE) 1 MG tablet Take 1 mg by mouth daily.   . [DISCONTINUED] levocetirizine (XYZAL) 5 MG tablet Take 5 mg by mouth every evening.   . [DISCONTINUED] TRIANEX 0.05 % OINT  02/04/2016: Received from: External Pharmacy  . [DISCONTINUED] VITAMIN B1-B12 IM Inject into the muscle every 30 (thirty) days.   . [DISCONTINUED] Vitamin D, Ergocalciferol, (DRISDOL) 50000 units CAPS capsule Take 1 capsule (50,000 Units total) by mouth once a week.    No facility-administered encounter medications on file as of 09/10/2016.      Allergies:  Allergies  Allergen Reactions  . Entex Lq [Phenylephrine-Guaifenesin] Swelling    TONGUE  . Codeine Hives and Other (See Comments)    Pt reports only having symptoms after receiving large amounts of medication  . Sulfa Antibiotics Itching  . Tape Other (See Comments)    CAN PULL OFF LAYER OF SKIN    Family  History: Family History  Problem Relation Age of Onset  . Osteoporosis Mother   . Chronic Renal Failure Father   . Arthritis/Rheumatoid Sister   . Stroke Sister   . Fibromyalgia Sister   . Breast cancer Sister 35  . Diabetes Brother     Social History: Social History  Substance Use Topics  . Smoking status: Former Smoker    Years: 3.00    Types: Cigarettes    Quit date: 1969  . Smokeless tobacco: Never Used  . Alcohol use No     Comment: 4-5 times a year   Social History   Social History Narrative   She is a retired Camera operator.   She lives with husband and son in a one story home.  Has 2 children.   Moved here from [place].   Highest level of education:  Masters     Review of Systems:  CONSTITUTIONAL: No fevers, chills, night sweats, or  weight loss.   EYES: No visual changes or eye pain ENT: No hearing changes.  No history of nose bleeds.   RESPIRATORY: No cough, wheezing and shortness of breath.   CARDIOVASCULAR: Negative for chest pain, and palpitations.   GI: Negative for abdominal discomfort, blood in stools or black stools.  No recent change in bowel habits.   GU:  No history of incontinence.   MUSCLOSKELETAL: + history of joint pain or swelling.  myalgias.   SKIN: +for lesions, +rash, and itching.   HEMATOLOGY/ONCOLOGY: Negative for prolonged bleeding, bruising easily, and swollen nodes.  No history of cancer.   ENDOCRINE: Negative for cold or heat intolerance, polydipsia or goiter.   PSYCH:  No depression +anxiety symptoms.   NEURO: As Above.   Vital Signs:  BP 140/90   Pulse 94   Ht _0  (1.549 m)   Wt 204 lb 6 oz (92.7 kg)   SpO2 93%   BMI 38.62 kg/m    General Medical Exam:   General:  Well appearing, comfortable.   Eyes/ENT: see cranial nerve examination.   Neck: No masses appreciated.  Full range of motion without tenderness.  No carotid bruits. Respiratory:  Clear to auscultation, good air entry bilaterally.   Cardiac:  Regular rate and  rhythm, no murmur.   Extremities:  No deformities, edema, or skin discoloration.  Skin:  No rashes or lesions.  Neurological Exam: MENTAL STATUS including orientation to time, place, person, recent and remote memory, attention span and concentration, language, and fund of knowledge is normal.  Speech is not dysarthric.  CRANIAL NERVES: II:  No visual field defects.  Unremarkable fundi.   III-IV-VI: Pupils equal round and reactive to light.  Normal conjugate, extra-ocular eye movements in all directions of gaze.  No nystagmus.  No ptosis.   V:  Normal facial sensation.     VII:  Normal facial symmetry and movements.   VIII:  Normal hearing and vestibular function.   IX-X:  Normal palatal movement.   XI:  Normal shoulder shrug and head rotation.   XII:  Normal tongue strength and range of motion, no deviation or fasciculation.  MOTOR:  No atrophy, fasciculations or abnormal movements.  No pronator drift.  Tone is normal.    Right Upper Extremity:    Left Upper Extremity:    Deltoid  5/5   Deltoid  5/5   Biceps  5/5   Biceps  5/5   Triceps  5/5   Triceps  5/5   Wrist extensors  5/5   Wrist extensors  5/5   Wrist flexors  5/5   Wrist flexors  5/5   Finger extensors  5/5   Finger extensors  5/5   Finger flexors  5/5   Finger flexors  5/5   Dorsal interossei  5/5   Dorsal interossei  5/5   Abductor pollicis  5/5   Abductor pollicis  5/5   Tone (Ashworth scale)  0  Tone (Ashworth scale)  0   Right Lower Extremity:    Left Lower Extremity:    Hip flexors  4/5   Hip flexors  4/5   Hip extensors  4/5   Hip extensors  4/5   Knee flexors  4/5   Knee flexors  4+/5   Knee extensors  5/5   Knee extensors  5/5   Dorsiflexors  4-/5   Dorsiflexors  4-/5   Plantarflexors  4-/5   Plantarflexors  4-/5   Toe extensors  3+/5  Toe extensors  3+/5   Toe flexors  3/5   Toe flexors  3/5   Tone (Ashworth scale)  0  Tone (Ashworth scale)  0   MSRs:  Right                                                                  Left brachioradialis 3+  brachioradialis 3+  biceps 3+  biceps 3+  triceps 2+  triceps 2+  patellar 2+  patellar 2+  ankle jerk 2+  ankle jerk 2+  Hoffman no  Hoffman no  plantar response down  plantar response down   SENSORY:  Pin prick and temperature diminished over the anterolateral thighs bilaterally; in the lower legs, there is a gradient pattern of sensory loss to all modalities below the mid-calf.    COORDINATION/GAIT: Normal finger-to- nose-finger. Finger tapping intact, toe tapping is slowed.  She is unable to rise from a chair without using arms.  Gait is wide-based and assisted with one-person, small steps as if she is dragging each leg, as there is poor clearance of the ground when walking, as if shuffling, but this is due to hip flexion and foot weakness.   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 Her exam shows prominent motor > sensory deficits of distal and proximal leg.  She also has gradient pattern of sensory loss involving the lower legs and feet.  The time course of her symptoms raises the possibility of dapsone-induced neuropathy, which manifests with greater motor involvement and is consistent with her exam.  To further investigate her symptoms, she will have NCS/EMG of the legs as well as labs screening for myopathy, diabetes, and heavy metal toxicity.  The fact that her reflexes are preserved is most likely due to her known cervical canal stenosis, as this normally would be diminished in neuropathy.   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    PLAN/RECOMMENDATIONS:  1.  NCS/EMG of the legs 2.  Check ESR, CRP, CK, aldolase, HbA1c, heavy metal screen, copper.  TSH, vitamin B12, Lyme, and SPEP with IFE is normal 3.   If her work-up indicates polyneuropathy, I would favor discontinuing her dapsone and seek alternative agents to treat her autoimmune condition.  The duration of this appointment visit was 60 minutes of face-to-face time with the patient.  Greater than 50% of this time was spent in counseling, explanation of diagnosis, planning of further management, and coordination of care.   Thank you for allowing me to participate in patient's care.  If I can answer any additional questions, I would be pleased to do so.    Sincerely,    Meara Wiechman K. Posey Pronto, DO

## 2016-09-10 ENCOUNTER — Other Ambulatory Visit (INDEPENDENT_AMBULATORY_CARE_PROVIDER_SITE_OTHER): Payer: Medicare HMO

## 2016-09-10 ENCOUNTER — Other Ambulatory Visit: Payer: Medicare HMO

## 2016-09-10 ENCOUNTER — Ambulatory Visit (INDEPENDENT_AMBULATORY_CARE_PROVIDER_SITE_OTHER): Payer: Medicare HMO | Admitting: Neurology

## 2016-09-10 ENCOUNTER — Encounter: Payer: Self-pay | Admitting: Neurology

## 2016-09-10 VITALS — BP 140/90 | HR 94 | Ht 61.0 in | Wt 204.4 lb

## 2016-09-10 DIAGNOSIS — R7309 Other abnormal glucose: Secondary | ICD-10-CM

## 2016-09-10 DIAGNOSIS — R29898 Other symptoms and signs involving the musculoskeletal system: Secondary | ICD-10-CM

## 2016-09-10 DIAGNOSIS — G629 Polyneuropathy, unspecified: Secondary | ICD-10-CM

## 2016-09-10 DIAGNOSIS — G620 Drug-induced polyneuropathy: Secondary | ICD-10-CM

## 2016-09-10 DIAGNOSIS — Z131 Encounter for screening for diabetes mellitus: Secondary | ICD-10-CM | POA: Diagnosis not present

## 2016-09-10 DIAGNOSIS — T371X5A Adverse effect of antimycobacterial drugs, initial encounter: Secondary | ICD-10-CM | POA: Diagnosis not present

## 2016-09-10 LAB — SEDIMENTATION RATE: Sed Rate: 3 mm/hr (ref 0–30)

## 2016-09-10 LAB — CK: CK TOTAL: 107 U/L (ref 7–177)

## 2016-09-10 LAB — C-REACTIVE PROTEIN: CRP: 0.4 mg/dL — AB (ref 0.5–20.0)

## 2016-09-10 NOTE — Patient Instructions (Addendum)
NCS/EMG of the legs on June 19th at 11am.  Please arrive 15 minutes prior to appointment.  Check labs  We will call you with the results and let you know the next step

## 2016-09-11 LAB — HEMOGLOBIN A1C

## 2016-09-13 DIAGNOSIS — Z79899 Other long term (current) drug therapy: Secondary | ICD-10-CM | POA: Diagnosis not present

## 2016-09-13 DIAGNOSIS — Z5181 Encounter for therapeutic drug level monitoring: Secondary | ICD-10-CM | POA: Diagnosis not present

## 2016-09-13 DIAGNOSIS — L138 Other specified bullous disorders: Secondary | ICD-10-CM | POA: Diagnosis not present

## 2016-09-13 LAB — COPPER, SERUM: COPPER: 100 ug/dL (ref 70–175)

## 2016-09-13 LAB — ALDOLASE: Aldolase: 4.9 U/L (ref <=8.1)

## 2016-09-14 LAB — HEAVY METALS PANEL, BLOOD
Arsenic: <3 mcg/L (ref <23)
Lead: <1 ug/dL (ref <5)
Mercury, B: <4 mcg/L (ref <=10)

## 2016-09-21 ENCOUNTER — Telehealth: Payer: Self-pay | Admitting: Neurology

## 2016-09-21 ENCOUNTER — Other Ambulatory Visit: Payer: Self-pay | Admitting: *Deleted

## 2016-09-21 ENCOUNTER — Ambulatory Visit (INDEPENDENT_AMBULATORY_CARE_PROVIDER_SITE_OTHER): Payer: Medicare HMO | Admitting: Neurology

## 2016-09-21 DIAGNOSIS — R29898 Other symptoms and signs involving the musculoskeletal system: Secondary | ICD-10-CM

## 2016-09-21 DIAGNOSIS — G629 Polyneuropathy, unspecified: Secondary | ICD-10-CM

## 2016-09-21 DIAGNOSIS — G620 Drug-induced polyneuropathy: Secondary | ICD-10-CM

## 2016-09-21 DIAGNOSIS — T371X5A Adverse effect of antimycobacterial drugs, initial encounter: Principal | ICD-10-CM

## 2016-09-21 DIAGNOSIS — Z131 Encounter for screening for diabetes mellitus: Secondary | ICD-10-CM

## 2016-09-21 NOTE — Telephone Encounter (Signed)
Patient states that she is to follow up with Dr Posey Pronto now. She wasn't sure if you still needed to see her in Oct or could we cancel appt. Please advise

## 2016-09-21 NOTE — Telephone Encounter (Signed)
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 She has discontinued dapsone since last week at the instruction of her dermatologist and has noticed slight movement of her toes, which is improved. I have recommended her to start physical therapy and return to clinic in 4-6 months for follow-up visit. Patient was informed him that if, indeed, this is dapsone-induced neuropathy, she should continue to see improvement, however time course for recovery can vary from months to years.  If her symptoms progress despite stopping dapsone, further workup will be necessary.  Skip Litke K. Posey Pronto, DO

## 2016-09-21 NOTE — Telephone Encounter (Signed)
error 

## 2016-09-21 NOTE — Procedures (Signed)
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  NCV & EMG Findings: Extensive electrodiagnostic testing of the right lower extremity and additional studies of the left shows:  1. Bilateral sural and superficial peroneal sensory responses are within normal limits. 2. Bilateral peroneal (extensor digitorum brevis and tibialis anterior) and left tibial motor responses show reduced amplitude. The right tibial motor response is within normal limits. 3. Bilateral tibial H reflex studies are within normal limits. 4. Diffuse chronic motor axon loss changes are seen affecting all of the tested muscles of the lower extremity and/or worse distally with there is evidence of ongoing active denervation.   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    A multilevel intraspinal canal lesion (i.e. radiculopathy, disorder of anterior horn cells, etc.) is thought to be less likely. Clinical correlation recommended.    ___________________________ Narda Amber, DO    Nerve Conduction Studies Anti Sensory Summary Table   Site NR Peak (ms) Norm Peak (ms) P-T Amp (V) Norm P-T Amp  Left Sup Peroneal Anti Sensory (Ant Lat Mall)  32.5C  12 cm    2.2 <4.6 14.6 >3  Right Sup Peroneal Anti Sensory (Ant Lat Mall)  32.5C  12 cm    2.4 <4.6 16.6 >3  Left Sural Anti Sensory (Lat Mall)  32.5C  Calf    3.2 <4.6 11.2 >3  Right Sural Anti Sensory (Lat Mall)  32.5C  Calf    3.1 <4.6 10.5  >3   Motor Summary Table   Site NR Onset (ms) Norm Onset (ms) O-P Amp (mV) Norm O-P Amp Site1 Site2 Delta-0 (ms) Dist (cm) Vel (m/s) Norm Vel (m/s)  Left Peroneal Motor (Ext Dig Brev)  32.5C  Ankle    3.5 <6.0 1.4 >2.5 B Fib Ankle 6.5 32.0 49 >40  B Fib    10.0  1.3  Poplt B Fib 1.6 10.0 63 >40  Poplt    11.6  1.2         Right Peroneal Motor (Ext Dig Brev)  32.5C  Ankle    3.3 <6.0 1.1 >2.5 B Fib Ankle 7.6 35.0 46 >40  B Fib    10.9  0.9  Poplt B Fib 1.4 10.0 71 >40  Poplt    12.3  0.7         Left Peroneal TA Motor (Tib Ant)  32.5C  Fib Head    3.0 <4.5 2.8 >3 Poplit Fib Head 1.8 10.0 56 >40  Poplit    4.8  2.6         Right Peroneal TA Motor (Tib Ant)  32.5C  Fib Head    4.1 <4.5 2.1 >3 Poplit Fib Head 1.1 10.0 91 >40  Poplit    5.2  2.0         Left Tibial Motor (Abd Hall Brev)  32.5C  Ankle    5.5 <6.0 2.7 >4 Knee Ankle 9.1 37.0 41 >40  Knee    14.6  2.4  Right Tibial Motor (Abd Hall Brev)  32.5C  Ankle    3.3 <6.0 4.6 >4 Knee Ankle 7.4 37.0 50 >40  Knee    10.7  3.6          H Reflex Studies   NR H-Lat (ms) Lat Norm (ms) L-R H-Lat (ms)  Left Tibial (Gastroc)  32.5C     32.52 <35 0.00  Right Tibial (Gastroc)  32.5C     32.52 <35 0.00   EMG   Side Muscle Ins Act Fibs Psw Fasc Number Recrt Dur Dur. Amp Amp. Poly Poly. Comment  Right AntTibialis Nml 1+ Nml Nml 3- Rapid Most 1+ Most 1+ Many 1+ N/A  Right Gastroc Nml 1+ Nml Nml 3- Rapid Most 1+ Most 1+ Most 1+ N/A  Right Flex Dig Long Nml 1+ Nml Nml [facility] Rapid All 1+ All 1+ Nml Nml N/A  Right RectFemoris Nml Nml Nml Nml 3- Rapid Many 1+ Many 1+ Nml Nml N/A  Right GluteusMed Nml Nml Nml Nml 2- Rapid Some 1+ Some 1+ Nml Nml N/A  Right BicepsFemS Nml 1+ Nml Nml 2- Rapid Many 1+ Many 1+ Many 1+ N/A  Right Lumbo Parasp Low Nml Nml Nml Nml NE - - - - - - - N/A  Left AntTibialis Nml 1+ Nml Nml 3- Rapid Most 1+ Most 1+ Most 1+ N/A  Left Gastroc Nml Nml Nml Nml 3- Rapid Many 1+ Many 1+ Nml Nml N/A  Left RectFemoris  Nml Nml Nml Nml 3- Rapid Many 1+ Many 1+ Nml Nml N/A  Left BicepsFemS Nml Nml Nml Nml 2- Rapid Some 1+ Some 1+ Nml Nml N/A      Waveforms:

## 2016-09-21 NOTE — Telephone Encounter (Signed)
Appt cancelled

## 2016-10-08 ENCOUNTER — Ambulatory Visit (INDEPENDENT_AMBULATORY_CARE_PROVIDER_SITE_OTHER): Payer: Medicare HMO

## 2016-10-08 DIAGNOSIS — E538 Deficiency of other specified B group vitamins: Secondary | ICD-10-CM | POA: Diagnosis not present

## 2016-10-08 MED ORDER — CYANOCOBALAMIN 1000 MCG/ML IJ SOLN
1000.0000 ug | Freq: Once | INTRAMUSCULAR | Status: AC
  Administered 2016-10-08: 1000 ug via INTRAMUSCULAR

## 2016-10-12 DIAGNOSIS — Z5181 Encounter for therapeutic drug level monitoring: Secondary | ICD-10-CM | POA: Diagnosis not present

## 2016-10-12 DIAGNOSIS — M2550 Pain in unspecified joint: Secondary | ICD-10-CM | POA: Diagnosis not present

## 2016-10-12 DIAGNOSIS — L138 Other specified bullous disorders: Secondary | ICD-10-CM | POA: Diagnosis not present

## 2016-10-12 DIAGNOSIS — Z79899 Other long term (current) drug therapy: Secondary | ICD-10-CM | POA: Diagnosis not present

## 2016-10-27 DIAGNOSIS — R262 Difficulty in walking, not elsewhere classified: Secondary | ICD-10-CM | POA: Diagnosis not present

## 2016-11-02 DIAGNOSIS — R262 Difficulty in walking, not elsewhere classified: Secondary | ICD-10-CM | POA: Diagnosis not present

## 2016-11-04 DIAGNOSIS — R262 Difficulty in walking, not elsewhere classified: Secondary | ICD-10-CM | POA: Diagnosis not present

## 2016-11-09 ENCOUNTER — Ambulatory Visit (INDEPENDENT_AMBULATORY_CARE_PROVIDER_SITE_OTHER): Payer: Medicare HMO

## 2016-11-09 DIAGNOSIS — E538 Deficiency of other specified B group vitamins: Secondary | ICD-10-CM

## 2016-11-09 DIAGNOSIS — R262 Difficulty in walking, not elsewhere classified: Secondary | ICD-10-CM | POA: Diagnosis not present

## 2016-11-09 MED ORDER — CYANOCOBALAMIN 1000 MCG/ML IJ SOLN
1000.0000 ug | Freq: Once | INTRAMUSCULAR | Status: AC
  Administered 2016-11-09: 1000 ug via INTRAMUSCULAR

## 2016-11-11 ENCOUNTER — Ambulatory Visit (INDEPENDENT_AMBULATORY_CARE_PROVIDER_SITE_OTHER): Payer: Medicare HMO | Admitting: Family Medicine

## 2016-11-11 ENCOUNTER — Encounter: Payer: Self-pay | Admitting: Family Medicine

## 2016-11-11 VITALS — BP 120/82 | HR 68 | Ht 61.0 in | Wt 201.0 lb

## 2016-11-11 DIAGNOSIS — K219 Gastro-esophageal reflux disease without esophagitis: Secondary | ICD-10-CM | POA: Diagnosis not present

## 2016-11-11 DIAGNOSIS — J301 Allergic rhinitis due to pollen: Secondary | ICD-10-CM

## 2016-11-11 DIAGNOSIS — N3001 Acute cystitis with hematuria: Secondary | ICD-10-CM

## 2016-11-11 LAB — POCT URINALYSIS DIPSTICK
BILIRUBIN UA: NEGATIVE
GLUCOSE UA: NEGATIVE
KETONES UA: NEGATIVE
NITRITE UA: NEGATIVE
SPEC GRAV UA: 1.02 (ref 1.010–1.025)
UROBILINOGEN UA: 0.2 U/dL
pH, UA: 6 (ref 5.0–8.0)

## 2016-11-11 MED ORDER — DOXYCYCLINE HYCLATE 100 MG PO TABS: 100.0000 mg | ORAL_TABLET | Freq: Two times a day (BID) | ORAL | 0 refills | Status: DC

## 2016-11-11 MED ORDER — LEVOCETIRIZINE DIHYDROCHLORIDE 5 MG PO TABS: 5.0000 mg | ORAL_TABLET | ORAL | 1 refills | Status: DC | PRN

## 2016-11-11 MED ORDER — OMEPRAZOLE 20 MG PO CPDR: 20.0000 mg | DELAYED_RELEASE_CAPSULE | Freq: Every day | ORAL | 1 refills | Status: DC

## 2016-11-11 NOTE — Progress Notes (Signed)
Name: Lindsay Stephenson   MRN: [id number]    DOB: [birth year]   Date:11/11/2016       Progress Note  Subjective  Chief Complaint  Chief Complaint  Patient presents with  . Allergic Rhinitis   . Gastroesophageal Reflux  . Urinary Tract Infection    Gastroesophageal Reflux  She reports no abdominal pain, no belching, no chest pain, no choking, no coughing, no dysphagia, no early satiety, no heartburn, no hoarse voice, no nausea, no sore throat, no stridor or no wheezing. This is a chronic problem. The current episode started more than 1 year ago. The problem has been unchanged. The symptoms are aggravated by certain foods. Pertinent negatives include no anemia, fatigue, melena, muscle weakness, orthopnea or weight loss. There are no known risk factors. She has tried a PPI for the symptoms. The treatment provided moderate (must maintain) relief.  Urinary Tract Infection   This is a new problem. The current episode started 1 to 4 weeks ago. The problem occurs intermittently. The quality of the pain is described as burning and aching. The pain is at a severity of 4/10. The pain is moderate. There has been no fever. Associated symptoms include chills, frequency, hematuria and urgency. Pertinent negatives include no discharge, flank pain, hesitancy, nausea, sweats or vomiting. She has tried nothing for the symptoms. The treatment provided moderate relief. There is no history of catheterization, kidney stones, recurrent UTIs, a single kidney, urinary stasis or a urological procedure.  Sinus Problem  This is a chronic problem. The current episode started more than 1 year ago. The problem has been gradually improving since onset. There has been no fever. The fever has been present for 3 to 4 days. The pain is mild. Associated symptoms include chills. Pertinent negatives include no coughing, ear pain, headaches, hoarse voice, neck pain, shortness of breath or sore throat. Past treatments include acetaminophen.     No problem-specific Assessment & Plan notes found for this encounter.   Past Medical History:  Diagnosis Date  . Allergy   . Anemia    TAKING IRON  . Arthritis    KNEES AND HANDS  . Asthma    COUGHING, NO ATTACKS IN A YEAR OR 2  . Dyspnea    ON EXERTION  . GERD (gastroesophageal reflux disease)   . HOH (hard of hearing)    30-35 % HEARING LOSS  . Hyperlipidemia   . Hypertension    HX OF  . LABD (linear immunoglobulin A bullous dermatosis)   . Mitral valve prolapse   . Mitral valve prolapse   . 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 Parkinson's disease ([place])   . Rectal bleeding   . Sleep apnea    CPAP  . Vertigo     Past Surgical History:  Procedure Laterality Date  . ABDOMINAL HYSTERECTOMY    . APPENDECTOMY    . COLONOSCOPY WITH PROPOFOL N/A 01/16/2016   Procedure: COLONOSCOPY WITH PROPOFOL;  Surgeon: Lucilla Lame, MD;  Location: [facility];  Service: Endoscopy;  Laterality: N/A;  CPAP  . POLYPECTOMY  01/16/2016   Procedure: POLYPECTOMY;  Surgeon: Lucilla Lame, MD;  Location: [facility];  Service: Endoscopy;;  . REPLACEMENT TOTAL KNEE BILATERAL Bilateral    2013, 2014  . TONSILLECTOMY     ADENOIDECTOMY    Family History  Problem Relation Age of Onset  . Osteoporosis Mother   . Chronic Renal Failure Father   . Arthritis/Rheumatoid Sister   .  Stroke Sister   . Fibromyalgia Sister   . Breast cancer Sister 67  . Diabetes Brother     Social History   Social History  . Marital status: Married    Spouse name: N/A  . Number of children: N/A  . Years of education: N/A   Occupational History  . retired     high school french   Social History Main Topics  . Smoking status: Former Smoker    Years: 3.00    Types: Cigarettes    Quit date: 1969  . Smokeless tobacco: Never Used  . Alcohol use No     Comment: 4-5 times a year  . Drug use: No  . Sexual activity: Not Currently   Other Topics Concern   . Not on file   Social History Narrative   She is a retired Camera operator.   She lives with husband and son in a one story home.  Has 2 children.   Moved here from [place].   Highest level of education:  Masters     Allergies  Allergen Reactions  . Entex Lq [Phenylephrine-Guaifenesin] Swelling    TONGUE  . Codeine Hives and Other (See Comments)    Pt reports only having symptoms after receiving large amounts of medication  . Sulfa Antibiotics Itching  . Tape Other (See Comments)    CAN PULL OFF LAYER OF SKIN    Outpatient Medications Prior to Visit  Medication Sig Dispense Refill  . albuterol (PROVENTIL HFA;VENTOLIN HFA) 108 (90 Base) MCG/ACT inhaler Inhale 1-2 puffs into the lungs every 6 (six) hours as needed for wheezing or shortness of breath. 1 Inhaler 1  . folic acid (FOLVITE) 1 MG tablet     . methotrexate 2.5 MG tablet     . Multiple Vitamin (MULTIVITAMIN) capsule Take 1 capsule by mouth daily.    . polyethylene glycol (MIRALAX / GLYCOLAX) packet Take 17 g by mouth daily.    . predniSONE (DELTASONE) 5 MG tablet     . triamcinolone cream (KENALOG) 0.1 %     . TRIANEX 0.05 % OINT     . amoxicillin (AMOXIL) 500 MG tablet Take 500 mg by mouth. Before dental work    . omeprazole (PRILOSEC) 20 MG capsule TAKE 1 CAPSULE DAILY 90 capsule 1  . Clobetasol Propionate 0.05 % shampoo     . dapsone 100 MG tablet 1 tablet daily. Derm IN AM    . ferrous sulfate 325 (65 FE) MG tablet Take 325 mg by mouth every other day.      No facility-administered medications prior to visit.     Review of Systems  Constitutional: Positive for chills. Negative for fatigue, fever, malaise/fatigue and weight loss.  HENT: Negative for ear discharge, ear pain, hoarse voice and sore throat.   Eyes: Negative for blurred vision.  Respiratory: Negative for cough, sputum production, choking, shortness of breath and wheezing.   Cardiovascular: Negative for chest pain, palpitations and leg swelling.   Gastrointestinal: Negative for abdominal pain, blood in stool, constipation, diarrhea, dysphagia, heartburn, melena, nausea and vomiting.  Genitourinary: Positive for frequency, hematuria and urgency. Negative for dysuria, flank pain and hesitancy.  Musculoskeletal: Negative for back pain, joint pain, myalgias, muscle weakness and neck pain.  Skin: Negative for rash.  Neurological: Negative for dizziness, tingling, sensory change, focal weakness and headaches.  Endo/Heme/Allergies: Negative for environmental allergies and polydipsia. Does not bruise/bleed easily.  Psychiatric/Behavioral: Negative for depression and suicidal ideas. The patient is not nervous/anxious and  does not have insomnia.      Objective  Vitals:   11/11/16 1020  BP: 120/82  Pulse: 68  Weight: 201 lb (91.2 kg)  Height: 5' 1"  (1.549 m)    Physical Exam  Constitutional: She is well-developed, well-nourished, and in no distress. No distress.  HENT:  Head: Normocephalic and atraumatic.  Right Ear: External ear normal.  Left Ear: External ear normal.  Nose: Nose normal.  Mouth/Throat: Oropharynx is clear and moist.  Eyes: Pupils are equal, round, and reactive to light. Conjunctivae and EOM are normal. Right eye exhibits no discharge. Left eye exhibits no discharge.  Neck: Normal range of motion. Neck supple. No JVD present. No thyromegaly present.  Cardiovascular: Normal rate, regular rhythm, normal heart sounds and intact distal pulses.  Exam reveals no gallop and no friction rub.   No murmur heard. Pulmonary/Chest: Effort normal and breath sounds normal. She has no wheezes. She has no rales.  Abdominal: Soft. Bowel sounds are normal. She exhibits no distension and no mass. There is no tenderness. There is no rebound and no guarding.  Musculoskeletal: Normal range of motion. She exhibits no edema.  Lymphadenopathy:    She has no cervical adenopathy.  Neurological: She is alert. She has normal reflexes.  Skin:  Skin is warm and dry. She is not diaphoretic.  Psychiatric: Mood and affect normal.      Assessment & Plan  Problem List Items Addressed This Visit      Digestive   Esophageal reflux   Relevant Medications   omeprazole (PRILOSEC) 20 MG capsule    Other Visit Diagnoses    Acute cystitis with hematuria    -  Primary   Relevant Medications   doxycycline (VIBRA-TABS) 100 MG tablet   Other Relevant Orders   POCT urinalysis dipstick (Completed)   Seasonal allergic rhinitis due to pollen       Relevant Medications   levocetirizine (XYZAL) 5 MG tablet      Meds ordered this encounter  Medications  . levocetirizine (XYZAL) 5 MG tablet    Sig: Take 1 tablet (5 mg total) by mouth as needed.    Dispense:  90 tablet    Refill:  1  . omeprazole (PRILOSEC) 20 MG capsule    Sig: Take 1 capsule (20 mg total) by mouth daily.    Dispense:  90 capsule    Refill:  1  . doxycycline (VIBRA-TABS) 100 MG tablet    Sig: Take 1 tablet (100 mg total) by mouth 2 (two) times daily.    Dispense:  6 tablet    Refill:  0      Dr. Macon [facility]  11/11/16

## 2016-11-16 DIAGNOSIS — R262 Difficulty in walking, not elsewhere classified: Secondary | ICD-10-CM | POA: Diagnosis not present

## 2016-11-17 ENCOUNTER — Ambulatory Visit: Payer: Medicare HMO | Admitting: Family Medicine

## 2016-11-18 DIAGNOSIS — R262 Difficulty in walking, not elsewhere classified: Secondary | ICD-10-CM | POA: Diagnosis not present

## 2016-11-23 DIAGNOSIS — L138 Other specified bullous disorders: Secondary | ICD-10-CM | POA: Diagnosis not present

## 2016-11-23 DIAGNOSIS — Z79899 Other long term (current) drug therapy: Secondary | ICD-10-CM | POA: Diagnosis not present

## 2016-11-25 DIAGNOSIS — R262 Difficulty in walking, not elsewhere classified: Secondary | ICD-10-CM | POA: Diagnosis not present

## 2016-11-26 ENCOUNTER — Ambulatory Visit (INDEPENDENT_AMBULATORY_CARE_PROVIDER_SITE_OTHER): Payer: Medicare HMO

## 2016-11-26 DIAGNOSIS — Z23 Encounter for immunization: Secondary | ICD-10-CM | POA: Diagnosis not present

## 2016-11-30 DIAGNOSIS — R262 Difficulty in walking, not elsewhere classified: Secondary | ICD-10-CM | POA: Diagnosis not present

## 2016-12-02 DIAGNOSIS — R262 Difficulty in walking, not elsewhere classified: Secondary | ICD-10-CM | POA: Diagnosis not present

## 2016-12-07 DIAGNOSIS — R262 Difficulty in walking, not elsewhere classified: Secondary | ICD-10-CM | POA: Diagnosis not present

## 2016-12-09 DIAGNOSIS — R262 Difficulty in walking, not elsewhere classified: Secondary | ICD-10-CM | POA: Diagnosis not present

## 2016-12-10 ENCOUNTER — Encounter: Payer: Self-pay | Admitting: Family Medicine

## 2016-12-10 ENCOUNTER — Ambulatory Visit: Payer: Medicare HMO

## 2016-12-10 ENCOUNTER — Ambulatory Visit (INDEPENDENT_AMBULATORY_CARE_PROVIDER_SITE_OTHER): Payer: Medicare HMO | Admitting: Family Medicine

## 2016-12-10 VITALS — BP 120/78 | HR 80 | Ht 61.0 in | Wt 206.0 lb

## 2016-12-10 DIAGNOSIS — L03114 Cellulitis of left upper limb: Secondary | ICD-10-CM

## 2016-12-10 DIAGNOSIS — E538 Deficiency of other specified B group vitamins: Secondary | ICD-10-CM | POA: Diagnosis not present

## 2016-12-10 MED ORDER — CEPHALEXIN 500 MG PO TABS: 500.0000 mg | ORAL_TABLET | Freq: Two times a day (BID) | ORAL | 0 refills | Status: DC

## 2016-12-10 MED ORDER — CYANOCOBALAMIN 1000 MCG/ML IJ SOLN
1000.0000 ug | Freq: Once | INTRAMUSCULAR | Status: AC
  Administered 2016-12-10: 1000 ug via INTRAMUSCULAR

## 2016-12-10 NOTE — Progress Notes (Signed)
Name: Lindsay Stephenson   MRN: [id number]    DOB: Jan 09, 1946   Date:12/10/2016       Progress Note  Subjective  Chief Complaint  Chief Complaint  Patient presents with  . Insect Bite    bit on L) arm and has swelling and red splotchy area around elbow    Rash  This is a new problem. The current episode started in the past 7 days. The problem has been gradually worsening since onset. The affected locations include the left arm. The rash is characterized by redness and swelling. She was exposed to an insect bite/sting. Pertinent negatives include no anorexia, congestion, cough, diarrhea, eye pain, facial edema, fatigue, fever, joint pain, nail changes, rhinorrhea, shortness of breath, sore throat or vomiting. Past treatments include nothing. The treatment provided mild relief.    No problem-specific Assessment & Plan notes found for this encounter.   Past Medical History:  Diagnosis Date  . Allergy   . Anemia    TAKING IRON  . Arthritis    KNEES AND HANDS  . Asthma    COUGHING, NO ATTACKS IN A YEAR OR 2  . Dyspnea    ON EXERTION  . GERD (gastroesophageal reflux disease)   . HOH (hard of hearing)    30-35 % HEARING LOSS  . Hyperlipidemia   . Hypertension    HX OF  . LABD (linear immunoglobulin A bullous dermatosis)   . Mitral valve prolapse   . Mitral valve prolapse   . 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 Parkinson's disease ([place])   . Rectal bleeding   . Sleep apnea    CPAP  . Vertigo     Past Surgical History:  Procedure Laterality Date  . ABDOMINAL HYSTERECTOMY    . APPENDECTOMY    . COLONOSCOPY WITH PROPOFOL N/A 01/16/2016   Procedure: COLONOSCOPY WITH PROPOFOL;  Surgeon: Lucilla Lame, MD;  Location: [facility];  Service: Endoscopy;  Laterality: N/A;  CPAP  . POLYPECTOMY  01/16/2016   Procedure: POLYPECTOMY;  Surgeon: Lucilla Lame, MD;  Location: [facility];  Service: Endoscopy;;  . REPLACEMENT TOTAL KNEE BILATERAL  Bilateral    2013, 2014  . TONSILLECTOMY     ADENOIDECTOMY    Family History  Problem Relation Age of Onset  . Osteoporosis Mother   . Chronic Renal Failure Father   . Arthritis/Rheumatoid Sister   . Stroke Sister   . Fibromyalgia Sister   . Breast cancer Sister 84  . Diabetes Brother     Social History   Social History  . Marital status: Married    Spouse name: N/A  . Number of children: N/A  . Years of education: N/A   Occupational History  . retired     high school french   Social History Main Topics  . Smoking status: Former Smoker    Years: 3.00    Types: Cigarettes    Quit date: 1969  . Smokeless tobacco: Never Used  . Alcohol use No     Comment: 4-5 times a year  . Drug use: No  . Sexual activity: Not Currently   Other Topics Concern  . Not on file   Social History Narrative   She is a retired Camera operator.   She lives with husband and son in a one story home.  Has 2 children.   Moved here from [place].   Highest level of education:  Masters  Allergies  Allergen Reactions  . Entex Lq [Phenylephrine-Guaifenesin] Swelling    TONGUE  . Codeine Hives and Other (See Comments)    Pt reports only having symptoms after receiving large amounts of medication  . Sulfa Antibiotics Itching  . Tape Other (See Comments)    CAN PULL OFF LAYER OF SKIN    Outpatient Medications Prior to Visit  Medication Sig Dispense Refill  . albuterol (PROVENTIL HFA;VENTOLIN HFA) 108 (90 Base) MCG/ACT inhaler Inhale 1-2 puffs into the lungs every 6 (six) hours as needed for wheezing or shortness of breath. 1 Inhaler 1  . Clobetasol Propionate 0.05 % shampoo     . doxycycline (VIBRA-TABS) 100 MG tablet Take 1 tablet (100 mg total) by mouth 2 (two) times daily. 6 tablet 0  . folic acid (FOLVITE) 1 MG tablet     . gabapentin (NEURONTIN) 100 MG capsule Take 100-200 mg by mouth at bedtime. Dr Sharol Roussel    . levocetirizine (XYZAL) 5 MG tablet Take 1 tablet (5 mg total) by mouth as  needed. 90 tablet 1  . methotrexate 2.5 MG tablet     . Multiple Vitamin (MULTIVITAMIN) capsule Take 1 capsule by mouth daily.    Marland Kitchen omeprazole (PRILOSEC) 20 MG capsule Take 1 capsule (20 mg total) by mouth daily. 90 capsule 1  . polyethylene glycol (MIRALAX / GLYCOLAX) packet Take 17 g by mouth daily.    Marland Kitchen triamcinolone cream (KENALOG) 0.1 %     . TRIANEX 0.05 % OINT     . predniSONE (DELTASONE) 5 MG tablet      No facility-administered medications prior to visit.     Review of Systems  Constitutional: Negative for chills, fatigue, fever, malaise/fatigue and weight loss.  HENT: Negative for congestion, ear discharge, ear pain, rhinorrhea and sore throat.   Eyes: Negative for blurred vision and pain.  Respiratory: Negative for cough, sputum production, shortness of breath and wheezing.   Cardiovascular: Negative for chest pain, palpitations and leg swelling.  Gastrointestinal: Negative for abdominal pain, anorexia, blood in stool, constipation, diarrhea, heartburn, melena, nausea and vomiting.  Genitourinary: Negative for dysuria, frequency, hematuria and urgency.  Musculoskeletal: Negative for back pain, joint pain, myalgias and neck pain.  Skin: Positive for rash. Negative for nail changes.  Neurological: Negative for dizziness, tingling, sensory change, focal weakness and headaches.  Endo/Heme/Allergies: Negative for environmental allergies and polydipsia. Does not bruise/bleed easily.  Psychiatric/Behavioral: Negative for depression and suicidal ideas. The patient is not nervous/anxious and does not have insomnia.      Objective  Vitals:   12/10/16 1419  BP: 120/78  Pulse: 80  Weight: 206 lb (93.4 kg)  Height: 5' 1"  (1.549 m)    Physical Exam  Constitutional: She is well-developed, well-nourished, and in no distress. No distress.  HENT:  Head: Normocephalic and atraumatic.  Right Ear: External ear normal.  Left Ear: External ear normal.  Nose: Nose normal.   Mouth/Throat: Oropharynx is clear and moist.  Eyes: Pupils are equal, round, and reactive to light. Conjunctivae and EOM are normal. Right eye exhibits no discharge. Left eye exhibits no discharge.  Neck: Normal range of motion. Neck supple. No JVD present. No thyromegaly present.  Cardiovascular: Normal rate, regular rhythm, normal heart sounds and intact distal pulses.  Exam reveals no gallop and no friction rub.   No murmur heard. Pulmonary/Chest: Effort normal and breath sounds normal. She has no wheezes. She has no rales.  Abdominal: Soft. Bowel sounds are normal. She exhibits no mass.  There is no tenderness. There is no guarding.  Musculoskeletal: Normal range of motion. She exhibits no edema.  Lymphadenopathy:    She has no cervical adenopathy.  Neurological: She is alert. She has normal reflexes.  Skin: Skin is warm and dry. She is not diaphoretic. There is erythema.  Psychiatric: Mood and affect normal.  Nursing note and vitals reviewed.     Assessment & Plan  Problem List Items Addressed This Visit    None    Visit Diagnoses    Cellulitis of left upper extremity    -  Primary   B12 deficiency       Relevant Medications   cyanocobalamin ((VITAMIN B-12)) injection 1,000 mcg (Completed) (Start on 12/10/2016  3:15 PM)      Meds ordered this encounter  Medications  . Cephalexin 500 MG tablet    Sig: Take 1 tablet (500 mg total) by mouth 2 (two) times daily.    Dispense:  14 tablet    Refill:  0  . cyanocobalamin ((VITAMIN B-12)) injection 1,000 mcg      Dr. Otilio Miu [facility]  12/10/16

## 2016-12-16 DIAGNOSIS — R262 Difficulty in walking, not elsewhere classified: Secondary | ICD-10-CM | POA: Diagnosis not present

## 2016-12-21 DIAGNOSIS — R262 Difficulty in walking, not elsewhere classified: Secondary | ICD-10-CM | POA: Diagnosis not present

## 2016-12-23 DIAGNOSIS — R262 Difficulty in walking, not elsewhere classified: Secondary | ICD-10-CM | POA: Diagnosis not present

## 2016-12-28 ENCOUNTER — Ambulatory Visit (INDEPENDENT_AMBULATORY_CARE_PROVIDER_SITE_OTHER): Payer: Medicare HMO | Admitting: Family Medicine

## 2016-12-28 ENCOUNTER — Encounter: Payer: Self-pay | Admitting: Family Medicine

## 2016-12-28 VITALS — BP 130/74 | HR 80 | Ht 61.0 in | Wt 203.0 lb

## 2016-12-28 DIAGNOSIS — N3090 Cystitis, unspecified without hematuria: Secondary | ICD-10-CM | POA: Diagnosis not present

## 2016-12-28 DIAGNOSIS — R262 Difficulty in walking, not elsewhere classified: Secondary | ICD-10-CM | POA: Diagnosis not present

## 2016-12-28 LAB — POCT URINALYSIS DIPSTICK
Bilirubin, UA: NEGATIVE
GLUCOSE UA: NEGATIVE
Ketones, UA: NEGATIVE
Nitrite, UA: NEGATIVE
PH UA: 6 (ref 5.0–8.0)
PROTEIN UA: NEGATIVE
SPEC GRAV UA: 1.015 (ref 1.010–1.025)
Urobilinogen, UA: 0.2 E.U./dL

## 2016-12-28 MED ORDER — AMOXICILLIN 500 MG PO CAPS: 500.0000 mg | ORAL_CAPSULE | Freq: Three times a day (TID) | ORAL | 0 refills | Status: DC

## 2016-12-28 NOTE — Progress Notes (Signed)
Name: Lindsay Stephenson   MRN: [id number]    DOB: 11/16/1945   Date:12/28/2016       Progress Note  Subjective  Chief Complaint  Chief Complaint  Patient presents with  . Urinary Tract Infection    had doxy for uti in August    Urinary Tract Infection   This is a new problem. The current episode started in the past 7 days. The problem occurs every urination. The problem has been waxing and waning. The pain is mild. There has been no fever. Associated symptoms include frequency and urgency. Pertinent negatives include no chills, discharge, flank pain, hematuria, hesitancy, nausea, sweats or vomiting. She has tried nothing for the symptoms. The treatment provided mild relief.    No problem-specific Assessment & Plan notes found for this encounter.   Past Medical History:  Diagnosis Date  . Allergy   . Anemia    TAKING IRON  . Arthritis    KNEES AND HANDS  . Asthma    COUGHING, NO ATTACKS IN A YEAR OR 2  . Dyspnea    ON EXERTION  . GERD (gastroesophageal reflux disease)   . HOH (hard of hearing)    30-35 % HEARING LOSS  . Hyperlipidemia   . Hypertension    HX OF  . LABD (linear immunoglobulin A bullous dermatosis)   . Mitral valve prolapse   . Mitral valve prolapse   . 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 Parkinson's disease ([place])   . Rectal bleeding   . Sleep apnea    CPAP  . Vertigo     Past Surgical History:  Procedure Laterality Date  . ABDOMINAL HYSTERECTOMY    . APPENDECTOMY    . COLONOSCOPY WITH PROPOFOL N/A 01/16/2016   Procedure: COLONOSCOPY WITH PROPOFOL;  Surgeon: Lucilla Lame, MD;  Location: [facility];  Service: Endoscopy;  Laterality: N/A;  CPAP  . POLYPECTOMY  01/16/2016   Procedure: POLYPECTOMY;  Surgeon: Lucilla Lame, MD;  Location: [facility];  Service: Endoscopy;;  . REPLACEMENT TOTAL KNEE BILATERAL Bilateral    2013, 2014  . TONSILLECTOMY     ADENOIDECTOMY    Family History  Problem Relation  Age of Onset  . Osteoporosis Mother   . Chronic Renal Failure Father   . Arthritis/Rheumatoid Sister   . Stroke Sister   . Fibromyalgia Sister   . Breast cancer Sister 14  . Diabetes Brother     Social History   Social History  . Marital status: Married    Spouse name: N/A  . Number of children: N/A  . Years of education: N/A   Occupational History  . retired     high school french   Social History Main Topics  . Smoking status: Former Smoker    Years: 3.00    Types: Cigarettes    Quit date: 1969  . Smokeless tobacco: Never Used  . Alcohol use No     Comment: 4-5 times a year  . Drug use: No  . Sexual activity: Not Currently   Other Topics Concern  . Not on file   Social History Narrative   She is a retired Camera operator.   She lives with husband and son in a one story home.  Has 2 children.   Moved here from [place].   Highest level of education:  Masters     Allergies  Allergen Reactions  . Entex Lq [Phenylephrine-Guaifenesin] Swelling    TONGUE  .  Codeine Hives and Other (See Comments)    Pt reports only having symptoms after receiving large amounts of medication  . Sulfa Antibiotics Itching  . Tape Other (See Comments)    CAN PULL OFF LAYER OF SKIN    Outpatient Medications Prior to Visit  Medication Sig Dispense Refill  . albuterol (PROVENTIL HFA;VENTOLIN HFA) 108 (90 Base) MCG/ACT inhaler Inhale 1-2 puffs into the lungs every 6 (six) hours as needed for wheezing or shortness of breath. 1 Inhaler 1  . Clobetasol Propionate 0.05 % shampoo     . folic acid (FOLVITE) 1 MG tablet     . gabapentin (NEURONTIN) 100 MG capsule Take 100-200 mg by mouth at bedtime. Dr Sharol Roussel    . levocetirizine (XYZAL) 5 MG tablet Take 1 tablet (5 mg total) by mouth as needed. 90 tablet 1  . methotrexate 2.5 MG tablet     . Multiple Vitamin (MULTIVITAMIN) capsule Take 1 capsule by mouth daily.    Marland Kitchen omeprazole (PRILOSEC) 20 MG capsule Take 1 capsule (20 mg total) by mouth  daily. 90 capsule 1  . polyethylene glycol (MIRALAX / GLYCOLAX) packet Take 17 g by mouth daily.    . predniSONE (DELTASONE) 5 MG tablet Take 20 mg by mouth.    . triamcinolone cream (KENALOG) 0.1 %     . TRIANEX 0.05 % OINT     . Cephalexin 500 MG tablet Take 1 tablet (500 mg total) by mouth 2 (two) times daily. 14 tablet 0  . doxycycline (VIBRA-TABS) 100 MG tablet Take 1 tablet (100 mg total) by mouth 2 (two) times daily. 6 tablet 0   No facility-administered medications prior to visit.     Review of Systems  Constitutional: Negative for chills, fever, malaise/fatigue and weight loss.  HENT: Negative for ear discharge, ear pain and sore throat.   Eyes: Negative for blurred vision.  Respiratory: Negative for cough, sputum production, shortness of breath and wheezing.   Cardiovascular: Negative for chest pain, palpitations and leg swelling.  Gastrointestinal: Negative for abdominal pain, blood in stool, constipation, diarrhea, heartburn, melena, nausea and vomiting.  Genitourinary: Positive for frequency and urgency. Negative for dysuria, flank pain, hematuria and hesitancy.  Musculoskeletal: Negative for back pain, joint pain, myalgias and neck pain.  Skin: Negative for rash.  Neurological: Negative for dizziness, tingling, sensory change, focal weakness and headaches.  Endo/Heme/Allergies: Negative for environmental allergies and polydipsia. Does not bruise/bleed easily.  Psychiatric/Behavioral: Negative for depression and suicidal ideas. The patient is not nervous/anxious and does not have insomnia.      Objective  Vitals:   12/28/16 1411  BP: 130/74  Pulse: 80  Weight: 203 lb (92.1 kg)  Height: 5' 1"  (1.549 m)    Physical Exam  Constitutional: She is well-developed, well-nourished, and in no distress. No distress.  HENT:  Head: Normocephalic and atraumatic.  Right Ear: External ear normal.  Left Ear: External ear normal.  Nose: Nose normal.  Mouth/Throat: Oropharynx is  clear and moist.  Eyes: Pupils are equal, round, and reactive to light. Conjunctivae and EOM are normal. Right eye exhibits no discharge. Left eye exhibits no discharge.  Neck: Normal range of motion. Neck supple. No JVD present. No thyromegaly present.  Cardiovascular: Normal rate, regular rhythm, normal heart sounds and intact distal pulses.  Exam reveals no gallop and no friction rub.   No murmur heard. Pulmonary/Chest: Effort normal and breath sounds normal. She has no wheezes. She has no rales.  Abdominal: Soft. Bowel sounds are  normal. She exhibits no mass. There is no tenderness. There is no guarding.  Musculoskeletal: Normal range of motion. She exhibits no edema.  Lymphadenopathy:    She has no cervical adenopathy.  Neurological: She is alert. She has normal reflexes.  Skin: Skin is warm and dry. She is not diaphoretic.  Psychiatric: Mood and affect normal.  Nursing note and vitals reviewed.     Assessment & Plan  Problem List Items Addressed This Visit    None    Visit Diagnoses    Cystitis    -  Primary   Relevant Medications   amoxicillin (AMOXIL) 500 MG capsule   Other Relevant Orders   Urine Culture   POCT Urinalysis Dipstick (Completed)      Meds ordered this encounter  Medications  . amoxicillin (AMOXIL) 500 MG capsule    Sig: Take 1 capsule (500 mg total) by mouth 3 (three) times daily.    Dispense:  30 capsule    Refill:  0      Dr. Otilio Miu [facility]  12/28/16

## 2016-12-30 DIAGNOSIS — R262 Difficulty in walking, not elsewhere classified: Secondary | ICD-10-CM | POA: Diagnosis not present

## 2016-12-31 ENCOUNTER — Other Ambulatory Visit: Payer: Self-pay

## 2016-12-31 DIAGNOSIS — N3001 Acute cystitis with hematuria: Secondary | ICD-10-CM

## 2016-12-31 LAB — URINE CULTURE

## 2016-12-31 LAB — PLEASE NOTE

## 2016-12-31 MED ORDER — CIPROFLOXACIN HCL 250 MG PO TABS: 250.0000 mg | ORAL_TABLET | Freq: Two times a day (BID) | ORAL | 0 refills | Status: DC

## 2017-01-04 DIAGNOSIS — R262 Difficulty in walking, not elsewhere classified: Secondary | ICD-10-CM | POA: Diagnosis not present

## 2017-01-05 ENCOUNTER — Ambulatory Visit: Payer: Medicare HMO

## 2017-01-05 ENCOUNTER — Ambulatory Visit (INDEPENDENT_AMBULATORY_CARE_PROVIDER_SITE_OTHER): Payer: Medicare HMO

## 2017-01-05 DIAGNOSIS — D519 Vitamin B12 deficiency anemia, unspecified: Secondary | ICD-10-CM | POA: Diagnosis not present

## 2017-01-05 MED ORDER — CYANOCOBALAMIN 1000 MCG/ML IJ SOLN
1000.0000 ug | Freq: Once | INTRAMUSCULAR | Status: AC
  Administered 2017-01-05: 1000 ug via INTRAMUSCULAR

## 2017-01-06 ENCOUNTER — Ambulatory Visit: Payer: Medicare HMO | Admitting: Neurology

## 2017-01-07 DIAGNOSIS — R262 Difficulty in walking, not elsewhere classified: Secondary | ICD-10-CM | POA: Diagnosis not present

## 2017-01-11 DIAGNOSIS — R262 Difficulty in walking, not elsewhere classified: Secondary | ICD-10-CM | POA: Diagnosis not present

## 2017-01-14 DIAGNOSIS — R262 Difficulty in walking, not elsewhere classified: Secondary | ICD-10-CM | POA: Diagnosis not present

## 2017-01-18 DIAGNOSIS — R262 Difficulty in walking, not elsewhere classified: Secondary | ICD-10-CM | POA: Diagnosis not present

## 2017-01-25 DIAGNOSIS — R262 Difficulty in walking, not elsewhere classified: Secondary | ICD-10-CM | POA: Diagnosis not present

## 2017-01-28 DIAGNOSIS — R262 Difficulty in walking, not elsewhere classified: Secondary | ICD-10-CM | POA: Diagnosis not present

## 2017-02-01 ENCOUNTER — Ambulatory Visit: Payer: Medicare HMO | Admitting: Family Medicine

## 2017-02-01 DIAGNOSIS — R262 Difficulty in walking, not elsewhere classified: Secondary | ICD-10-CM | POA: Diagnosis not present

## 2017-02-02 ENCOUNTER — Encounter: Payer: Self-pay | Admitting: Family Medicine

## 2017-02-02 ENCOUNTER — Ambulatory Visit (INDEPENDENT_AMBULATORY_CARE_PROVIDER_SITE_OTHER): Payer: Medicare HMO | Admitting: Family Medicine

## 2017-02-02 VITALS — BP 130/70 | HR 64 | Ht 61.0 in | Wt 205.0 lb

## 2017-02-02 DIAGNOSIS — K219 Gastro-esophageal reflux disease without esophagitis: Secondary | ICD-10-CM | POA: Diagnosis not present

## 2017-02-02 MED ORDER — OMEPRAZOLE 20 MG PO CPDR: 20.0000 mg | DELAYED_RELEASE_CAPSULE | Freq: Every day | ORAL | 3 refills | Status: AC

## 2017-02-02 NOTE — Progress Notes (Signed)
Name: Lindsay Stephenson   MRN: [id number]    DOB: Jul 05, 1945   Date:02/02/2017       Progress Note  Subjective  Chief Complaint  Chief Complaint  Patient presents with  . Gastroesophageal Reflux    refill Omeprazole  . lipid drawn    Gastroesophageal Reflux  She reports no abdominal pain, no belching, no chest pain, no choking, no coughing, no dysphagia, no early satiety, no globus sensation, no heartburn, no hoarse voice, no nausea, no sore throat, no stridor, no tooth decay, no water brash or no wheezing. This is a chronic problem. The current episode started more than 1 year ago. The problem occurs rarely. The problem has been unchanged. The symptoms are aggravated by certain foods. Pertinent negatives include no anemia, fatigue, melena, muscle weakness, orthopnea or weight loss. There are no known risk factors. She has tried a PPI for the symptoms. The treatment provided mild relief. Past procedures do not include an abdominal ultrasound, an EGD, esophageal manometry, esophageal pH monitoring, H. pylori antibody titer or a UGI.    No problem-specific Assessment & Plan notes found for this encounter.   Past Medical History:  Diagnosis Date  . Allergy   . Anemia    TAKING IRON  . Arthritis    KNEES AND HANDS  . Asthma    COUGHING, NO ATTACKS IN A YEAR OR 2  . Dyspnea    ON EXERTION  . GERD (gastroesophageal reflux disease)   . HOH (hard of hearing)    30-35 % HEARING LOSS  . Hyperlipidemia   . Hypertension    HX OF  . LABD (linear immunoglobulin A bullous dermatosis)   . Mitral valve prolapse   . Mitral valve prolapse   . 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 Parkinson's disease ([place])   . Rectal bleeding   . Sleep apnea    CPAP  . Vertigo     Past Surgical History:  Procedure Laterality Date  . ABDOMINAL HYSTERECTOMY    . APPENDECTOMY    . COLONOSCOPY WITH PROPOFOL N/A 01/16/2016   Procedure: COLONOSCOPY WITH PROPOFOL;  Surgeon:  Lucilla Lame, MD;  Location: [facility];  Service: Endoscopy;  Laterality: N/A;  CPAP  . POLYPECTOMY  01/16/2016   Procedure: POLYPECTOMY;  Surgeon: Lucilla Lame, MD;  Location: [facility];  Service: Endoscopy;;  . REPLACEMENT TOTAL KNEE BILATERAL Bilateral    2013, 2014  . TONSILLECTOMY     ADENOIDECTOMY    Family History  Problem Relation Age of Onset  . Osteoporosis Mother   . Chronic Renal Failure Father   . Arthritis/Rheumatoid Sister   . Stroke Sister   . Fibromyalgia Sister   . Breast cancer Sister 51  . Diabetes Brother     Social History   Social History  . Marital status: Married    Spouse name: N/A  . Number of children: N/A  . Years of education: N/A   Occupational History  . retired     high school french   Social History Main Topics  . Smoking status: Former Smoker    Years: 3.00    Types: Cigarettes    Quit date: 1969  . Smokeless tobacco: Never Used  . Alcohol use No     Comment: 4-5 times a year  . Drug use: No  . Sexual activity: Not Currently   Other Topics Concern  . Not on file   Social History Narrative  She is a retired Camera operator.   She lives with husband and son in a one story home.  Has 2 children.   Moved here from [place].   Highest level of education:  Masters     Allergies  Allergen Reactions  . Entex Lq [Phenylephrine-Guaifenesin] Swelling    TONGUE  . Codeine Hives and Other (See Comments)    Pt reports only having symptoms after receiving large amounts of medication  . Sulfa Antibiotics Itching  . Tape Other (See Comments)    CAN PULL OFF LAYER OF SKIN    Outpatient Medications Prior to Visit  Medication Sig Dispense Refill  . albuterol (PROVENTIL HFA;VENTOLIN HFA) 108 (90 Base) MCG/ACT inhaler Inhale 1-2 puffs into the lungs every 6 (six) hours as needed for wheezing or shortness of breath. 1 Inhaler 1  . Clobetasol Propionate 0.05 % shampoo     . folic acid (FOLVITE) 1 MG tablet [place]     . gabapentin (NEURONTIN) 100 MG capsule Take 100-200 mg by mouth at bedtime. Dr Sharol Roussel    . levocetirizine (XYZAL) 5 MG tablet Take 1 tablet (5 mg total) by mouth as needed. 90 tablet 1  . methotrexate 2.5 MG tablet     . Multiple Vitamin (MULTIVITAMIN) capsule Take 1 capsule by mouth daily.    . polyethylene glycol (MIRALAX / GLYCOLAX) packet Take 17 g by mouth daily.    . predniSONE (DELTASONE) 5 MG tablet Take 20 mg by mouth.    . triamcinolone cream (KENALOG) 0.1 %     . TRIANEX 0.05 % OINT     . omeprazole (PRILOSEC) 20 MG capsule Take 1 capsule (20 mg total) by mouth daily. 90 capsule 1  . amoxicillin (AMOXIL) 500 MG capsule Take 1 capsule (500 mg total) by mouth 3 (three) times daily. 30 capsule 0  . ciprofloxacin (CIPRO) 250 MG tablet Take 1 tablet (250 mg total) by mouth 2 (two) times daily. 6 tablet 0   No facility-administered medications prior to visit.     Review of Systems  Constitutional: Negative for chills, fatigue, fever, malaise/fatigue and weight loss.  HENT: Negative for ear discharge, ear pain, hoarse voice and sore throat.   Eyes: Negative for blurred vision.  Respiratory: Negative for cough, sputum production, choking, shortness of breath and wheezing.   Cardiovascular: Negative for chest pain, palpitations and leg swelling.  Gastrointestinal: Negative for abdominal pain, blood in stool, constipation, diarrhea, dysphagia, heartburn, melena and nausea.  Genitourinary: Negative for dysuria, frequency, hematuria and urgency.  Musculoskeletal: Negative for back pain, joint pain, myalgias, muscle weakness and neck pain.  Skin: Negative for rash.  Neurological: Negative for dizziness, tingling, sensory change, focal weakness and headaches.  Endo/Heme/Allergies: Negative for environmental allergies and polydipsia. Does not bruise/bleed easily.  Psychiatric/Behavioral: Negative for depression and suicidal ideas. The patient is not nervous/anxious and does not have  insomnia.      Objective  Vitals:   02/02/17 0927  BP: 130/70  Pulse: 64  Weight: 205 lb (93 kg)  Height: 5' 1"  (1.549 m)    Physical Exam  Constitutional: She is well-developed, well-nourished, and in no distress. No distress.  HENT:  Head: Normocephalic and atraumatic.  Right Ear: External ear normal.  Left Ear: External ear normal.  Nose: Nose normal.  Mouth/Throat: Oropharynx is clear and moist.  Eyes: Pupils are equal, round, and reactive to light. Conjunctivae and EOM are normal. Right eye exhibits no discharge. Left eye exhibits no discharge.  Neck:  Normal range of motion. Neck supple. No JVD present. No thyromegaly present.  Cardiovascular: Normal rate, regular rhythm, normal heart sounds and intact distal pulses.  Exam reveals no gallop and no friction rub.   No murmur heard. Pulmonary/Chest: Effort normal and breath sounds normal.  Abdominal: Soft. Bowel sounds are normal. She exhibits no mass. There is no tenderness. There is no guarding.  Musculoskeletal: Normal range of motion. She exhibits no edema.  Lymphadenopathy:    She has no cervical adenopathy.  Neurological: She is alert. She has normal reflexes.  Skin: Skin is warm and dry. She is not diaphoretic.  Psychiatric: Mood and affect normal.  Nursing note and vitals reviewed.     Assessment & Plan  Problem List Items Addressed This Visit      Digestive   Esophageal reflux - Primary   Relevant Medications   omeprazole (PRILOSEC) 20 MG capsule      Meds ordered this encounter  Medications  . omeprazole (PRILOSEC) 20 MG capsule    Sig: Take 1 capsule (20 mg total) by mouth daily.    Dispense:  90 capsule    Refill:  3      Dr. Otilio Miu [facility]  02/02/17

## 2017-02-04 DIAGNOSIS — R262 Difficulty in walking, not elsewhere classified: Secondary | ICD-10-CM | POA: Diagnosis not present

## 2017-02-07 ENCOUNTER — Other Ambulatory Visit: Payer: Self-pay

## 2017-02-07 NOTE — Patient Instructions (Signed)
GUIDELINES FOR  LOW-CHOLESTEROL, LOW-TRIGLYCERIDE DIETS    FOODS TO USE   MEATS, FISH Choose lean meats (chicken, [place], veal, and non-fatty cuts of beef with excess fat trimmed; one serving = 3 oz of cooked meat). Also, fresh or frozen fish, canned fish packed in water, and shellfish (lobster, crabs, shrimp, and oysters). Limit use to no more than one serving of one of these per week. Shellfish are high in cholesterol but low in saturated fat and should be used sparingly. Meats and fish should be broiled (pan or oven) or baked on a rack.  EGGS Egg substitutes and egg whites (use freely). Egg yolks (limit two per week).  FRUITS Eat three servings of fresh fruit per day (1 serving =  cup). Be sure to have at least one citrus fruit daily. Frozen and canned fruit with no sugar or syrup added may be used.  VEGETABLES Most vegetables are not limited (see next page). One dark-green (string beans, escarole) or one deep yellow (squash) vegetable is recommended daily. Cauliflower, broccoli, and celery, as well as potato skins, are recommended for their fiber content. (Fiber is associated with cholesterol reduction) It is preferable to steam vegetables, but they may be boiled, strained, or braised with polyunsaturated vegetable oil (see below).  BEANS Dried peas or beans (1 serving =  cup) may be used as a bread substitute.  NUTS Almonds, walnuts, and peanuts may be used sparingly  (1 serving = 1 Tablespoonful). Use pumpkin, sesame, or sunflower seeds.  BREADS, GRAINS One roll or one slice of whole grain or enriched bread may be used, or three soda crackers or four pieces of melba toast as a substitute. Spaghetti, rice or noodles ( cup) or  large ear of corn may be used as a bread substitute. In preparing these foods do not use butter or shortening, use soft margarine. Also use egg and sugar substitutes.  Choose high fiber grains, such as oats and whole wheat.  CEREALS Use  cup of hot cereal or  cup of  cold cereal per day. Add a sugar substitute if desired, with 99% fat free or skim milk.  MILK PRODUCTS Always use 99% fat free or skim milk, dairy products such as low fat cheeses (farmer's uncreamed diet cottage), low-fat yogurt, and powdered skim milk.  FATS, OILS Use soft (not stick) margarine; vegetable oils that are high in polyunsaturated fats (such as safflower, sunflower, soybean, corn, and cottonseed). Always refrigerate meat drippings to harden the fat and remove it before preparing gravies  DESSERTS, SNACKS Limit to two servings per day; substitute each serving for a bread/cereal serving: ice milk, water sherbet (1/4 cup); unflavored gelatin or gelatin flavored with sugar substitute (1/3 cup); pudding prepared with skim milk (1/2 cup); egg white souffls; unbuttered popcorn (1  cups). Substitute carob for chocolate.  BEVERAGES Fresh fruit juices (limit 4 oz per day); black coffee, plain or herbal teas; soft drinks with sugar substitutes; club soda, preferably salt-free; cocoa made with skim milk or nonfat dried milk and water (sugar substitute added if desired); clear broth. Alcohol: limit two servings per day (see second page).  MISCELLANEOUS  You may use the following freely: vinegar, spices, herbs, nonfat bouillon, mustard, Worcestershire sauce, soy sauce, flavoring essence.                  GUIDELINES FOR  LOW-CHOLESTEROL, LOW TRIGLYCERIDE DIETS    FOODS TO AVOID   MEATS, FISH Marbled beef, pork, bacon, sausage, and other pork products; fatty  fowl (duck, goose); skin and fat of [place] and chicken; processed meats; luncheon meats (salami, bologna); frankfurters and fast-food hamburgers (theyre loaded with fat); organ meats (kidneys, liver); canned fish packed in oil.  EGGS Limit egg yolks to two per week.   FRUITS Coconuts (rich in saturated fats).  VEGETABLES Avoid avocados. Starchy vegetables (potatoes, corn, lima beans, dried peas, beans) may be used only if  substitutes for a serving of bread or cereal. (Baked potato skin, however, is desirable for its fiber content.  BEANS Commercial baked beans with sugar and/or pork added.  NUTS Avoid nuts.  Limit peanuts and walnuts to one tablespoonful per day.  BREADS, GRAINS Any baked goods with shortening and/or sugar. Commercial mixes with dried eggs and whole milk. Avoid sweet rolls, doughnuts, breakfast pastries (Danish), and sweetened packaged cereals (the added sugar converts readily to triglycerides).  MILK PRODUCTS Whole milk and whole-milk packaged goods; cream; ice cream; whole-milk puddings, yogurt, or cheeses; nondairy cream substitutes.  FATS, OILS Butter, lard, animal fats, bacon drippings, gravies, cream sauces as well as palm and coconut oils. All these are high in saturated fats. Examine labels on cholesterol free products for hydrogenated fats. (These are oils that have been hardened into solids and in the process have become saturated.)  DESSERTS, SNACKS Fried snack foods like potato chips; chocolate; candies in general; jams, jellies, syrups; whole- milk puddings; ice cream and milk sherbets; hydrogenated peanut butter.  BEVERAGES Sugared fruit juices and soft drinks; cocoa made with whole milk and/or sugar. When using alcohol (1 oz liquor, 5 oz beer, or 2  oz dry table wine per serving), one serving must be substituted for one bread or cereal serving (limit, two servings of alcohol per day).   SPECIAL NOTES    1. Remember that even non-limited foods should be used in moderation. 2. While on a cholesterol-lowering diet, be sure to avoid animal fats and marbled meats. 3. 3. While on a triglyceride-lowering diet, be sure to avoid sweets and to control the amount of carbohydrates you eat (starchy foods such as flour, bread, potatoes).While on a tri-glyceride-lowering diet, be sure to avoid sweets 4. Buy a good low-fat cookbook, such as the one published by the American Heart Association. 5. Consult  your physician if you have any questions.               [facility] Low Glycemic Diet Plan   Low Glycemic Foods (20-49) Moderate Glycemic Foods (50-69) High Glycemic Foods (70-100)      Breakfast Creals Breakfast Cereals Breakfast Cereals  All Bran All-Bran Fruit'n Oats   Bran Buds Bran Chex   Cheerios Corn chex    Fiber One Oatmeal (not instant)   Just Right Mini-Wheats   Corn Flakes Cream of Wheat    Oat Bran Special K Swiss Muesli   Grape Nuts Grape Nut Flakes      Grits Nutri-Grain    Fruits and fruit juice: Fruits Puffed Rice Puffed Wheat    (Limit to 1-2 Servings per day) Banana (under-ride) Dates   Rice Chex Rice Krispies    Apples Apricots (fresh/dried)   Figs Grapes   Shredded Wheat Team    Blackberries Blueberries   Kiwi Mango   Total     Cherries Cranberries   Oranges Raisins     Peaches Pears    Fruits  Plums Prunes   Fruit Juices Pineapple Watermelon    Grapefruit Raspberries   Cranberry Juice Orange Juice   Banana (over-ripe)  Strawberries Tangerines      Apple Juice Grapefruit Juice   Beans and Legumes Beverages  Tomato Juice    [place]-type baked beans Sodas, sweet tea, pineapple juice   Canned pinto, kidney, or navy beans   Beans and Legumes (fresh-cooked) Green peas Vegetables  Black-eyed peas Butter Beans    Potato, baked, boiled, fried, mashed  Chick peas Lentils   Vegetables [place] fries  Green beans Lima beans   Beets Carrots   Canned or frozen corn  Kidney beans Navy beans   Sweet potato Yam   Parsnips  Pinto beans Snow peas   Corn on the cob Winter squash      Non-starchy vegetables Grains Breads  Asparagus, avocado, broccoli, cabbage Cornmeal Rice, brown   Most breads (white and whole grain)  cauliflower, celery, cucumber, greens Rice, white Couscous   Bagels Bread sticks    lettuce, mushrooms, peppers, tomatoes  Bread stuffing Kaiser roll    okra, onions, spinach, summer squash Pasta Dinner rolls    [employer], cheese     Grains Ravioli, meat filled Spaghetti, white   Grains  Barley Bulgur    Rice, instant Tapioca, with milk    Rye Wild rice   Nuts    Cashews Macadamia   Candy and most cookies  Nuts and oils    Almonds, peanuts, sunflower seeds Snacks Snacks  hazelnuts, pecans, walnuts Chocolate Ice cream, lowfat   Donuts Corn chips    Oils that are liquid at room temperature Muffin Popcorn   Jelly beans Pretzels      Pastries  Dairy, fish, meat, soy, and eggs    Milk, skim Lowfat cheese    Restaurant and ethnic foods  Yogurt, lowfat, fruit sugar sweetened  Most [place] food (sugar in stir fry    or wok sauce)  Lean red meat Fish    Teriyaki-style meats and vegetables  Skinless chicken and [place], shellfish        Egg whites (up to 3 daily), Soy Products    Egg yolks (up to 7 or _____ per week)

## 2017-02-08 ENCOUNTER — Ambulatory Visit: Payer: Medicare HMO

## 2017-02-16 ENCOUNTER — Ambulatory Visit (INDEPENDENT_AMBULATORY_CARE_PROVIDER_SITE_OTHER): Payer: Medicare HMO | Admitting: Family Medicine

## 2017-02-16 ENCOUNTER — Encounter: Payer: Self-pay | Admitting: Family Medicine

## 2017-02-16 VITALS — BP 120/80 | HR 100

## 2017-02-16 DIAGNOSIS — T07XXXA Unspecified multiple injuries, initial encounter: Secondary | ICD-10-CM

## 2017-02-16 DIAGNOSIS — E538 Deficiency of other specified B group vitamins: Secondary | ICD-10-CM

## 2017-02-16 DIAGNOSIS — W19XXXA Unspecified fall, initial encounter: Secondary | ICD-10-CM

## 2017-02-16 MED ORDER — CYANOCOBALAMIN 1000 MCG/ML IJ SOLN
1000.0000 ug | Freq: Once | INTRAMUSCULAR | Status: AC
  Administered 2017-02-16: 1000 ug via INTRAMUSCULAR

## 2017-02-16 NOTE — Addendum Note (Signed)
Addended by: Otilio Miu C on: 02/16/2017 11:37 AM   Modules accepted: Level of Service

## 2017-02-16 NOTE — Addendum Note (Signed)
Addended by: Otilio Miu C on: 02/16/2017 11:53 AM   Modules accepted: Level of Service

## 2017-02-16 NOTE — Progress Notes (Addendum)
Name: Lindsay Stephenson   MRN: [id number]    DOB: 18-Jun-1945   Date:02/16/2017       Progress Note  Subjective  Chief Complaint  Chief Complaint  Patient presents with  . Fall    pt fell    Fall  The accident occurred less than 1 hour ago. The fall occurred while walking. She landed on carpet. The point of impact was the face, left elbow and right elbow (chest/fell forward). The patient is experiencing no pain. Pertinent negatives include no abdominal pain, fever, headaches, hematuria, loss of consciousness, nausea, numbness or tingling. She has tried nothing for the symptoms.    No problem-specific Assessment & Plan notes found for this encounter.   Past Medical History:  Diagnosis Date  . Allergy   . Anemia    TAKING IRON  . Arthritis    KNEES AND HANDS  . Asthma    COUGHING, NO ATTACKS IN A YEAR OR 2  . Dyspnea    ON EXERTION  . GERD (gastroesophageal reflux disease)   . HOH (hard of hearing)    30-35 % HEARING LOSS  . Hyperlipidemia   . Hypertension    HX OF  . LABD (linear immunoglobulin A bullous dermatosis)   . Mitral valve prolapse   . Mitral valve prolapse   . 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 Parkinson's disease ([place])   . Rectal bleeding   . Sleep apnea    CPAP  . Vertigo     Past Surgical History:  Procedure Laterality Date  . ABDOMINAL HYSTERECTOMY    . APPENDECTOMY    . REPLACEMENT TOTAL KNEE BILATERAL Bilateral    2013, 2014  . TONSILLECTOMY     ADENOIDECTOMY    Family History  Problem Relation Age of Onset  . Osteoporosis Mother   . Chronic Renal Failure Father   . Arthritis/Rheumatoid Sister   . Stroke Sister   . Fibromyalgia Sister   . Breast cancer Sister 56  . Diabetes Brother     Social History   Socioeconomic History  . Marital status: Married    Spouse name: Not on file  . Number of children: Not on file  . Years of education: Not on file  . Highest education level: Not on file   Social Needs  . Financial resource strain: Not on file  . Food insecurity - worry: Not on file  . Food insecurity - inability: Not on file  . Transportation needs - medical: Not on file  . Transportation needs - non-medical: Not on file  Occupational History  . Occupation: retired    Comment: high school french  Tobacco Use  . Smoking status: Former Smoker    Years: 3.00    Types: Cigarettes    Last attempt to quit: 1969    Years since quitting: 49.9  . Smokeless tobacco: Never Used  Substance and Sexual Activity  . Alcohol use: No    Comment: 4-5 times a year  . Drug use: No  . Sexual activity: Not Currently  Other Topics Concern  . Not on file  Social History Narrative   She is a retired Camera operator.   She lives with husband and son in a one story home.  Has 2 children.   Moved here from [place].   Highest level of education:  Masters     Allergies  Allergen Reactions  . Entex Lq [Phenylephrine-Guaifenesin] Swelling    TONGUE  .  Codeine Hives and Other (See Comments)    Pt reports only having symptoms after receiving large amounts of medication  . Sulfa Antibiotics Itching  . Tape Other (See Comments)    CAN PULL OFF LAYER OF SKIN    Outpatient Medications Prior to Visit  Medication Sig Dispense Refill  . albuterol (PROVENTIL HFA;VENTOLIN HFA) 108 (90 Base) MCG/ACT inhaler Inhale 1-2 puffs into the lungs every 6 (six) hours as needed for wheezing or shortness of breath. 1 Inhaler 1  . Clobetasol Propionate 0.05 % shampoo     . folic acid (FOLVITE) 1 MG tablet [address] [address]    . gabapentin (NEURONTIN) 100 MG capsule Take 100-200 mg by mouth at bedtime. Dr Sharol Roussel    . levocetirizine (XYZAL) 5 MG tablet Take 1 tablet (5 mg total) by mouth as needed. 90 tablet 1  . methotrexate 2.5 MG tablet     . Multiple Vitamin (MULTIVITAMIN) capsule Take 1 capsule by mouth daily.    Marland Kitchen omeprazole (PRILOSEC) 20 MG capsule Take 1 capsule (20 mg total) by mouth daily. 90 capsule  3  . polyethylene glycol (MIRALAX / GLYCOLAX) packet Take 17 g by mouth daily.    . predniSONE (DELTASONE) 5 MG tablet Take 20 mg by mouth.    . triamcinolone cream (KENALOG) 0.1 %     . TRIANEX 0.05 % OINT      No facility-administered medications prior to visit.     Review of Systems  Constitutional: Negative for chills, fever, malaise/fatigue and weight loss.  HENT: Negative for ear discharge, ear pain and sore throat.   Eyes: Negative for blurred vision.  Respiratory: Negative for cough, sputum production, shortness of breath and wheezing.   Cardiovascular: Negative for chest pain, palpitations and leg swelling.  Gastrointestinal: Negative for abdominal pain, blood in stool, constipation, diarrhea, heartburn, melena and nausea.  Genitourinary: Negative for dysuria, frequency, hematuria and urgency.  Musculoskeletal: Negative for back pain, joint pain, myalgias and neck pain.  Skin: Negative for rash.  Neurological: Negative for dizziness, tingling, sensory change, focal weakness, loss of consciousness, numbness and headaches.  Endo/Heme/Allergies: Negative for environmental allergies and polydipsia. Does not bruise/bleed easily.  Psychiatric/Behavioral: Negative for depression and suicidal ideas. The patient is not nervous/anxious and does not have insomnia.      Objective  Vitals:   02/16/17 0952  BP: 120/80  Pulse: 100    Physical Exam  Constitutional: She is oriented to person, place, and time and well-developed, well-nourished, and in no distress. Vital signs are normal. No distress.  HENT:  Head: Normocephalic and atraumatic.  Right Ear: Tympanic membrane and external ear normal.  Left Ear: Tympanic membrane and external ear normal.  Nose: Nose normal.  Mouth/Throat: Oropharynx is clear and moist.  Eyes: Conjunctivae and EOM are normal. Pupils are equal, round, and reactive to light. Right eye exhibits no discharge. Left eye exhibits no discharge.  Neck: Normal  range of motion and full passive range of motion without pain. Neck supple. Normal carotid pulses, no hepatojugular reflux and no JVD present. No spinous process tenderness and no muscular tenderness present. Carotid bruit is not present. No neck rigidity. Normal range of motion present. No thyromegaly present.  Cardiovascular: Normal rate, regular rhythm, normal heart sounds and intact distal pulses. Exam reveals no gallop and no friction rub.  No murmur heard. Pulmonary/Chest: Effort normal and breath sounds normal. She has no wheezes. She has no rales.  Abdominal: Soft. Bowel sounds are normal. She exhibits no  mass. There is no tenderness. There is no guarding.  Musculoskeletal: Normal range of motion. She exhibits no edema.  Lymphadenopathy:    She has no cervical adenopathy.  Neurological: She is alert and oriented to person, place, and time. She has normal sensation, normal reflexes and intact cranial nerves. She displays weakness. Gait abnormal.  Foot drop  Skin: Skin is warm and dry. She is not diaphoretic.  Psychiatric: Mood and affect normal.  Nursing note and vitals reviewed.     Assessment & Plan  Problem List Items Addressed This Visit    None    Visit Diagnoses    Fall, initial encounter    -  Primary   no concussion sx's   B12 deficiency       Relevant Medications   cyanocobalamin ((VITAMIN B-12)) injection 1,000 mcg (Completed)   Multiple contusions       forearms/thighs/lower legs/area of impact      Meds ordered this encounter  Medications  . cyanocobalamin ((VITAMIN B-12)) injection 1,000 mcg   No charge to visit- pt was evaluated after fall in lobby   Dr. Otilio Miu [facility]  02/16/17

## 2017-03-01 DIAGNOSIS — R262 Difficulty in walking, not elsewhere classified: Secondary | ICD-10-CM | POA: Diagnosis not present

## 2017-03-07 ENCOUNTER — Ambulatory Visit: Payer: Medicare HMO | Admitting: Neurology

## 2017-03-07 ENCOUNTER — Encounter: Payer: Self-pay | Admitting: Neurology

## 2017-03-07 VITALS — BP 148/88 | HR 116 | Ht 61.0 in | Wt 209.2 lb

## 2017-03-07 DIAGNOSIS — G620 Drug-induced polyneuropathy: Secondary | ICD-10-CM | POA: Diagnosis not present

## 2017-03-07 DIAGNOSIS — T371X5A Adverse effect of antimycobacterial drugs, initial encounter: Secondary | ICD-10-CM

## 2017-03-07 NOTE — Progress Notes (Signed)
Follow-up Visit   Date: 03/07/17    Lindsay Stephenson MRN: [id number] DOB: 1945/06/24   Interim History: Lindsay Stephenson is a 71 y.o. 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  The patient was accompanied to the clinic by husband who also provides collateral information.    History of present illness: She was started on dapsone in 2016 for linear IgA bullous dermatosa.  She moved from [place] to [facility] in May 2017.  Around this same time, she started having increased falls, developed progressive weakness of the legs, burning/stabbing pain of the legs, and falls.  She began using a cane in September 2017 and a rollator walker in March 2018.  Since using a walker, she has only fallen once.  She has constant severe pain of the lower legs, described as burning, stabbing, knife-like.  Because of the severity of her weakness, she is unable to raise the legs which was initially mistaken for Parkinson's gait.  She saw Dr. Carles Collet (movement disorder specialist) who stopped sinemet and did not see that she has Parkinson's. For her hip pain, she saw Dr. French Ana (ortho) who noted very mild degenerative changes of the hips and did not see that her gait difficulty with stemming from a primary orthopeadic problem.  Most recently, she saw Dr. Vertell Limber (neurosurgeon) for multilevel cervical canal stenosis and he recommended to follow symptoms clinically and continue further neurological work-up for neuropathy.  Of note, she reports having tingling sensation of the lateral thigh for 10 years.  She admits to always being overweight.  UPDATE 03/07/2017:  Within a month of stopping dapsone, she was able to start moving her toes.  She was started on methotrexate and prednisone for her dermatosa.  She is working very hard with water and land therapy, but has noticed a some improvement in her leg strength such that she is able to raise the leg  higher.  No no new weakness.  She still has right foot drop.  She is using a rollator, which has helped her balance and mobility.  She has only had one fall in the past 6 months. She has many questions regarding her prognosis and management options.  Medications:  Current Outpatient Medications on File Prior to Visit  Medication Sig Dispense Refill  . albuterol (PROVENTIL HFA;VENTOLIN HFA) 108 (90 Base) MCG/ACT inhaler Inhale 1-2 puffs into the lungs every 6 (six) hours as needed for wheezing or shortness of breath. 1 Inhaler 1  . amoxicillin (AMOXIL) 500 MG capsule Take 500 mg by mouth 3 (three) times daily. 1 po 1 hour before dental work    . Calcium Carbonate-Vit D-Min (CALCIUM 1200 PO) Take by mouth.    . Clobetasol Propionate 0.05 % shampoo     . folic acid (FOLVITE) 1 MG tablet [place] [address]    . gabapentin (NEURONTIN) 100 MG capsule Take 100-200 mg by mouth at bedtime. Dr Sharol Roussel    . levocetirizine (XYZAL) 5 MG tablet Take 1 tablet (5 mg total) by mouth as needed. 90 tablet 1  . methotrexate 2.5 MG tablet     . Multiple Vitamin (MULTIVITAMIN) capsule Take 1 capsule by mouth daily.    Marland Kitchen omeprazole (PRILOSEC) 20 MG capsule Take 1 capsule (20 mg total) by mouth daily. 90 capsule 3  . polyethylene glycol (MIRALAX / GLYCOLAX) packet Take 17 g by mouth daily.    . predniSONE (DELTASONE) 5 MG tablet Take 20 mg  by mouth.    . triamcinolone cream (KENALOG) 0.1 %     . TRIANEX 0.05 % OINT      No current facility-administered medications on file prior to visit.     Allergies:  Allergies  Allergen Reactions  . Entex Lq [Phenylephrine-Guaifenesin] Swelling    TONGUE  . Codeine Hives and Other (See Comments)    Pt reports only having symptoms after receiving large amounts of medication  . Sulfa Antibiotics Itching  . Tape Other (See Comments)    CAN PULL OFF LAYER OF SKIN    Review of Systems:  CONSTITUTIONAL: No fevers, chills, night sweats, or weight loss.  EYES: No visual  changes or eye pain ENT: No hearing changes.  No history of nose bleeds.   RESPIRATORY: No cough, wheezing and shortness of breath.   CARDIOVASCULAR: Negative for chest pain, and palpitations.   GI: Negative for abdominal discomfort, blood in stools or black stools.  No recent change in bowel habits.   GU:  No history of incontinence.   MUSCLOSKELETAL: No history of joint pain or swelling.  No myalgias.   SKIN: Negative for lesions, rash, and itching.   ENDOCRINE: Negative for cold or heat intolerance, polydipsia or goiter.   PSYCH:  No depression + anxiety symptoms.   NEURO: As Above.   Vital Signs:  BP (!) 148/88   Pulse (!) 116   Ht 5' 1"  (1.549 m)   Wt 209 lb 4 oz (94.9 kg)   SpO2 96%   BMI 39.54 kg/m    General: Well appearing, comfortable, slightly anxious  Neurological Exam: MENTAL STATUS including orientation to time, place, person, recent and remote memory, attention span and concentration, language, and fund of knowledge is normal.  Speech is not dysarthric.  CRANIAL NERVES:  Pupils equal round and reactive to light.  Normal conjugate, extra-ocular eye movements in all directions of gaze.  No ptosis  Face is symmetric. Palate elevates symmetrically.  Tongue is midline.  MOTOR:  R TA atrophy, no fasciculations or abnormal movements.  No pronator drift.  Tone is normal.    Right Upper Extremity:    Left Upper Extremity:    Deltoid  5/5   Deltoid  5/5   Biceps  5/5   Biceps  5/5   Triceps  5/5   Triceps  5/5   Wrist extensors  5/5   Wrist extensors  5/5   Wrist flexors  5/5   Wrist flexors  5/5   Finger extensors  5/5   Finger extensors  5/5   Finger flexors  5/5   Finger flexors  5/5   Dorsal interossei  5/5   Dorsal interossei  5/5   Abductor pollicis  5/5   Abductor pollicis  5/5   Tone (Ashworth scale)  0  Tone (Ashworth scale)  0   Right Lower Extremity:    Left Lower Extremity:    Hip flexors  4/5   Hip flexors  5-/5   Hip extensors  5/5   Hip extensors  5/5    Knee flexors  5-/5   Knee flexors  5-/5   Knee extensors  5/5   Knee extensors  5/5   Dorsiflexors  4/5   Dorsiflexors  4/5   Plantarflexors  4/5   Plantarflexors  4/5   Toe extensors  3/5   Toe extensors  4/5   Toe flexors  3/5   Toe flexors  4/5   Tone (Ashworth scale)  0  Tone (Ashworth scale)  0    MSRs:  Reflexes are 2+/4 throughout, except 3+/4 bilateral patella.  SENSORY:  Intact to vibration throughout.  COORDINATION/GAIT:  Normal finger-to- nose-finger and heel-to-shin.  Intact rapid alternating movements bilaterally.  Gait wide-based, slow, R foot dragging, assisted with rollator.   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    A multilevel intraspinal canal lesion (i.e. radiculopathy, disorder of anterior horn cells, etc.) is thought to be less likely. Clinical correlation recommended.   MRI cervical spine wo contrast 07/17/2016: Cervical spondylosis and degenerative disc disease, causing moderate impingement at C4-5, C5-6, and C6-7 ; and mild impingement at C2-3, as detailed above.  MRI lumbar spine wo contrast 03/16/2016: 1. At L3-4 there is a broad-based disc osteophyte complex. Mild bilateral facet arthropathy. Bilateral lateral recess stenosis. Bilateral mild foraminal narrowing. Mild central canal narrowing. 2. At L4-5 there is a broad-based disc bulge with a small central disc protrusion. Bilateral facet arthropathy. Bilateral lateral recess narrowing. Mild bilateral foraminal stenosis. Mild bilateral facet arthropathy.   Labs 08/18/2015:  SPEP no M protein  IMPRESSION/PLAN: Dapsone-induced motor neuropathy affecting the legs.  Long discussion regarding management and prognosis.  Unfortunately, there is no curative treatment and management is supportive with physical therapy which she is extremely compliant with.  For her right foot drop, I  recommend that she get custom fit for a R AFO as this may have her more stability with gait.  It is too early to determine long-term neurological recovery, but based on her NCS/EMG, she will have some degree of lasting motor deficits and will need to use a rollator for support.    Return to clinic in 6 months  Greater than 50% of this 40 minute visit was spent in counseling, explanation of diagnosis, planning of further management, and coordination of care due to the many questions and concerns which were addressed.    Thank you for allowing me to participate in patient's care.  If I can answer any additional questions, I would be pleased to do so.    Sincerely,    Ivanna Kocak K. Posey Pronto, DO

## 2017-03-07 NOTE — Patient Instructions (Addendum)
1.  Right ankle foot orthotic 2.  Continue physical therapy for leg strengthening  Return to clinic in 6 months

## 2017-03-08 ENCOUNTER — Other Ambulatory Visit: Payer: Self-pay | Admitting: *Deleted

## 2017-03-08 DIAGNOSIS — R269 Unspecified abnormalities of gait and mobility: Secondary | ICD-10-CM

## 2017-03-08 MED ORDER — AMBULATORY NON FORMULARY MEDICATION: 1.0000 [IU] | Freq: Every day | 0 refills | Status: DC

## 2017-03-09 DIAGNOSIS — R262 Difficulty in walking, not elsewhere classified: Secondary | ICD-10-CM | POA: Diagnosis not present

## 2017-03-21 DIAGNOSIS — R262 Difficulty in walking, not elsewhere classified: Secondary | ICD-10-CM | POA: Diagnosis not present

## 2017-03-22 DIAGNOSIS — X58XXXA Exposure to other specified factors, initial encounter: Secondary | ICD-10-CM | POA: Diagnosis not present

## 2017-03-22 DIAGNOSIS — M2550 Pain in unspecified joint: Secondary | ICD-10-CM | POA: Diagnosis not present

## 2017-03-22 DIAGNOSIS — T371X5A Adverse effect of antimycobacterial drugs, initial encounter: Secondary | ICD-10-CM | POA: Diagnosis not present

## 2017-03-22 DIAGNOSIS — Z5181 Encounter for therapeutic drug level monitoring: Secondary | ICD-10-CM | POA: Diagnosis not present

## 2017-03-22 DIAGNOSIS — G701 Toxic myoneural disorders: Secondary | ICD-10-CM | POA: Diagnosis not present

## 2017-03-22 DIAGNOSIS — Z7952 Long term (current) use of systemic steroids: Secondary | ICD-10-CM | POA: Diagnosis not present

## 2017-03-22 DIAGNOSIS — L138 Other specified bullous disorders: Secondary | ICD-10-CM | POA: Diagnosis not present

## 2017-03-22 DIAGNOSIS — Z79899 Other long term (current) drug therapy: Secondary | ICD-10-CM | POA: Diagnosis not present

## 2017-03-28 DIAGNOSIS — M21371 Foot drop, right foot: Secondary | ICD-10-CM | POA: Diagnosis not present

## 2017-03-28 DIAGNOSIS — G9009 Other idiopathic peripheral autonomic neuropathy: Secondary | ICD-10-CM | POA: Diagnosis not present

## 2017-04-01 DIAGNOSIS — R262 Difficulty in walking, not elsewhere classified: Secondary | ICD-10-CM | POA: Diagnosis not present

## 2017-04-08 DIAGNOSIS — R262 Difficulty in walking, not elsewhere classified: Secondary | ICD-10-CM | POA: Diagnosis not present

## 2017-04-11 ENCOUNTER — Ambulatory Visit (INDEPENDENT_AMBULATORY_CARE_PROVIDER_SITE_OTHER): Payer: Medicare HMO

## 2017-04-11 DIAGNOSIS — E538 Deficiency of other specified B group vitamins: Secondary | ICD-10-CM | POA: Diagnosis not present

## 2017-04-11 MED ORDER — CYANOCOBALAMIN 1000 MCG/ML IJ SOLN
1000.0000 ug | Freq: Once | INTRAMUSCULAR | Status: AC
  Administered 2017-04-11: 1000 ug via INTRAMUSCULAR

## 2017-04-14 ENCOUNTER — Telehealth: Payer: Self-pay | Admitting: Family Medicine

## 2017-04-14 ENCOUNTER — Other Ambulatory Visit: Payer: Self-pay

## 2017-04-14 DIAGNOSIS — Z792 Long term (current) use of antibiotics: Secondary | ICD-10-CM

## 2017-04-14 NOTE — Telephone Encounter (Signed)
Called pt to sched for AWV with Nurse Health Advisor. Last AWV on 05/03/16  please schedule AWV with NHA any date after 05/04/17. C/b #  [phone] on Skype [email or web address] if you have questions

## 2017-04-15 DIAGNOSIS — R262 Difficulty in walking, not elsewhere classified: Secondary | ICD-10-CM | POA: Diagnosis not present

## 2017-04-18 MED ORDER — AMOXICILLIN 500 MG PO CAPS: 500.0000 mg | ORAL_CAPSULE | Freq: Three times a day (TID) | ORAL | 1 refills | Status: DC

## 2017-04-21 DIAGNOSIS — R262 Difficulty in walking, not elsewhere classified: Secondary | ICD-10-CM | POA: Diagnosis not present

## 2017-04-24 ENCOUNTER — Other Ambulatory Visit: Payer: Self-pay

## 2017-04-24 ENCOUNTER — Ambulatory Visit
Admission: EM | Admit: 2017-04-24 | Discharge: 2017-04-24 | Disposition: A | Discharge status: Home / Self Care | Payer: Medicare HMO | Attending: Family Medicine | Admitting: Family Medicine

## 2017-04-24 DIAGNOSIS — H6501 Acute serous otitis media, right ear: Secondary | ICD-10-CM

## 2017-04-24 DIAGNOSIS — H60311 Diffuse otitis externa, right ear: Secondary | ICD-10-CM | POA: Diagnosis not present

## 2017-04-24 MED ORDER — NEOMYCIN-POLYMYXIN-HC 3.5-10000-1 OT SUSP: 4.0000 [drp] | Freq: Three times a day (TID) | OTIC | 0 refills | Status: DC

## 2017-04-24 MED ORDER — AMOXICILLIN-POT CLAVULANATE 875-125 MG PO TABS: 1.0000 | ORAL_TABLET | Freq: Two times a day (BID) | ORAL | 0 refills | Status: DC

## 2017-04-24 MED ORDER — LIDOCAINE VISCOUS 2 % MT SOLN: 20.0000 mL | Freq: Four times a day (QID) | OROMUCOSAL | 0 refills | Status: DC | PRN

## 2017-04-24 NOTE — ED Triage Notes (Signed)
Patient complains of right ear pain that started on Thursday and noticed blood draining out of her ear. Patient states that her throat and and neck hurt badly.

## 2017-04-24 NOTE — ED Provider Notes (Signed)
[facility]    CSN: [id number] Arrival date & time: 04/24/17  0846     History   Chief Complaint Chief Complaint  Patient presents with  . Otalgia    right    HPI Lindsay Stephenson is a 72 y.o. female.   72 yo female with a c/o 4 days of right ear pain and drainage and 2 days of sore throat and tongue pain. Denies any fevers, chills. States she had some old, expired amoxicillin that she took for the last 2 days without improvement.    The history is provided by the patient.    Past Medical History:  Diagnosis Date  . Allergy   . Anemia    TAKING IRON  . Arthritis    KNEES AND HANDS  . Asthma    COUGHING, NO ATTACKS IN A YEAR OR 2  . Dyspnea    ON EXERTION  . GERD (gastroesophageal reflux disease)   . HOH (hard of hearing)    30-35 % HEARING LOSS  . Hyperlipidemia   . Hypertension    HX OF  . LABD (linear immunoglobulin A bullous dermatosis)   . Mitral valve prolapse   . Mitral valve prolapse   . 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 Parkinson's disease ([place])   . Rectal bleeding   . Sleep apnea    CPAP  . Vertigo     Patient Active Problem List   Diagnosis Date Noted  . 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 Blood in stool   . Benign neoplasm of transverse colon   . Inflammatory bowel disease   . Paroxysmal supraventricular tachycardia ([place]) 11/18/2015  . Shortness of breath 10/29/2015  . H/O fall 10/23/2015  . 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 Hyperlipidemia 10/01/2015  . Essential hypertension 10/01/2015  . Esophageal reflux 10/01/2015  . 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 ABDOMINAL HYSTERECTOMY    . APPENDECTOMY    . COLONOSCOPY WITH PROPOFOL N/A 01/16/2016   Procedure: COLONOSCOPY WITH PROPOFOL;  Surgeon: Lucilla Lame, MD;  Location: [place];  Service: Endoscopy;  Laterality: N/A;  CPAP  . KNEE SURGERY     2013  and 2014  . POLYPECTOMY  01/16/2016   Procedure: POLYPECTOMY;  Surgeon: Lucilla Lame, MD;  Location: [place];  Service: Endoscopy;;  . REPLACEMENT TOTAL KNEE BILATERAL Bilateral    2013, 2014  . TONSILLECTOMY     ADENOIDECTOMY    OB History    No data available       Home Medications    Prior to Admission medications   Medication Sig Start Date End Date Taking? Authorizing Provider  albuterol (PROVENTIL HFA;VENTOLIN HFA) 108 (90 Base) MCG/ACT inhaler Inhale 1-2 puffs into the lungs every 6 (six) hours as needed for wheezing or shortness of breath. 01/05/16  Yes Juline Patch, MD  AMBULATORY NON FORMULARY MEDICATION 1 Units by Other route daily. Right AFO 03/08/17  Yes Patel, Donika K, DO  amoxicillin (AMOXIL) 500 MG capsule Take 1 capsule (500 mg total) by mouth 3 (three) times daily. 1 po 1 hour before dental work 04/18/17  Yes Juline Patch, MD  Calcium Carbonate-Vit D-Min (CALCIUM 1200 PO) Take by mouth.   Yes [provider]  Clobetasol Propionate 0.05 % shampoo  01/28/16  Yes [provider]  folic acid (FOLVITE) 1 MG tablet [address] 09/16/16  Yes [provider]  gabapentin (NEURONTIN) 100 MG capsule Take 100-200 mg by mouth at bedtime. Dr Sharol Roussel 10/12/16  Yes [provider]  levocetirizine (XYZAL) 5 MG tablet Take 1 tablet (5 mg total) by mouth as needed. 11/11/16  Yes Juline Patch, MD  methotrexate 2.5 MG tablet  09/20/16  Yes [provider]  Multiple Vitamin (MULTIVITAMIN) capsule Take 1 capsule by mouth daily.   Yes [provider]  omeprazole (PRILOSEC) 20 MG capsule Take 1 capsule (20 mg total) by mouth daily. 02/02/17  Yes Juline Patch, MD  polyethylene glycol (MIRALAX / GLYCOLAX) packet Take 17 g by mouth daily.   Yes [provider]  predniSONE (DELTASONE) 5 MG tablet Take 20 mg by mouth. 11/23/16  Yes [provider]  triamcinolone cream (KENALOG) 0.1 %  07/16/16  Yes  [provider]  TRIANEX 0.05 % OINT  07/19/16  Yes [provider]  amoxicillin-clavulanate (AUGMENTIN) 875-125 MG tablet Take 1 tablet by mouth 2 (two) times daily. 04/24/17   Norval Gable, MD  lidocaine (XYLOCAINE) 2 % solution Use as directed 20 mLs in the mouth or throat every 6 (six) hours as needed for mouth pain. 04/24/17   Norval Gable, MD  neomycin-polymyxin-hydrocortisone (CORTISPORIN) 3.5-10000-1 OTIC suspension Place 4 drops into the right ear 3 (three) times daily. 04/24/17   Norval Gable, MD    Family History Family History  Problem Relation Age of Onset  . Osteoporosis Mother   . Chronic Renal Failure Father   . Arthritis/Rheumatoid Sister   . Stroke Sister   . Fibromyalgia Sister   . Breast cancer Sister 44  . Diabetes Brother     Social History Social History   Tobacco Use  . Smoking status: Former Smoker    Years: 3.00    Types: Cigarettes    Last attempt to quit: 1969    Years since quitting: 50.0  . Smokeless tobacco: Never Used  Substance Use Topics  . Alcohol use: No    Comment: 4-5 times a year  . Drug use: No     Allergies   Entex lq [phenylephrine-guaifenesin]; Codeine; Sulfa antibiotics; and Tape   Review of Systems Review of Systems   Physical Exam Triage Vital Signs ED Triage Vitals  Enc Vitals Group     BP 04/24/17 0926 (!) 142/77     Pulse Rate 04/24/17 0926 (!) 110     Resp 04/24/17 0926 19     Temp 04/24/17 0926 99.9 F (37.7 C)     Temp Source 04/24/17 0926 Oral     SpO2 04/24/17 0926 95 %     Weight 04/24/17 0922 202 lb (91.6 kg)     Height 04/24/17 0922 5' (1.524 m)     Head Circumference --      Peak Flow --      Pain Score 04/24/17 0922 10     Pain Loc --      Pain Edu? --      Excl. in [place]? --    No data found.  Updated Vital Signs BP (!) 142/77 (BP Location: Left Arm)   Pulse (!) 110   Temp 99.9 F (37.7 C) (Oral)   Resp 19   Ht 5' (1.524 m)   Wt 202 lb (91.6 kg)   SpO2 95%   BMI  39.45 kg/m   Visual Acuity Right Eye Distance:   Left Eye Distance:   Bilateral Distance:    Right Eye Near:  Left Eye Near:    Bilateral Near:     Physical Exam  Constitutional: She appears well-developed and well-nourished. No distress.  HENT:  Head: Normocephalic and atraumatic.  Right Ear: There is drainage and swelling. Tympanic membrane is erythematous. A middle ear effusion is present.  Left Ear: Tympanic membrane and ear canal normal.  Mouth/Throat: Oral lesions present. Posterior oropharyngeal erythema present. No oropharyngeal exudate, posterior oropharyngeal edema or tonsillar abscesses. No tonsillar exudate.  Ear canal erythema and edema  Skin: She is not diaphoretic.  Nursing note and vitals reviewed.    UC Treatments / Results  Labs (all labs ordered are listed, but only abnormal results are displayed) Labs Reviewed - No data to display  EKG  EKG Interpretation None       Radiology No results found.  Procedures Procedures (including critical care time)  Medications Ordered in UC Medications - No data to display   Initial Impression / Assessment and Plan / UC Course  I have reviewed the triage vital signs and the nursing notes.  Pertinent labs & imaging results that were available during my care of the patient were reviewed by me and considered in my medical decision making (see chart for details).       Final Clinical Impressions(s) / UC Diagnoses   Final diagnoses:  Right acute serous otitis media, recurrence not specified  Acute diffuse otitis externa of right ear    ED Discharge Orders        Ordered    amoxicillin-clavulanate (AUGMENTIN) 875-125 MG tablet  2 times daily     04/24/17 0945    lidocaine (XYLOCAINE) 2 % solution  Every 6 hours PRN     04/24/17 0945    neomycin-polymyxin-hydrocortisone (CORTISPORIN) 3.5-10000-1 OTIC suspension  3 times daily     04/24/17 0945     1. diagnosis reviewed with patient 2. rx as per  orders above; reviewed possible side effects, interactions, risks and benefits  3. Recommend supportive treatment with otc analgesics prn 4. Follow-up prn if symptoms worsen or don't improve   Controlled Substance Prescriptions [place] Controlled Substance Registry consulted? Not Applicable   Norval Gable, MD 04/24/17 1030

## 2017-04-27 ENCOUNTER — Telehealth: Payer: Self-pay

## 2017-04-27 NOTE — Telephone Encounter (Signed)
Called to follow up with patient since visit here at [facility]. Spoke with pt., reports some improvement.  Patient instructed to call back with any questions or concerns. [facility]

## 2017-05-04 ENCOUNTER — Ambulatory Visit: Payer: Medicare HMO | Admitting: Family Medicine

## 2017-05-04 ENCOUNTER — Encounter: Payer: Self-pay | Admitting: Family Medicine

## 2017-05-04 ENCOUNTER — Other Ambulatory Visit: Payer: Self-pay

## 2017-05-04 VITALS — BP 128/84 | HR 100 | Temp 98.7°F | Resp 16 | Ht 61.0 in | Wt 195.0 lb

## 2017-05-04 DIAGNOSIS — R5383 Other fatigue: Secondary | ICD-10-CM | POA: Diagnosis not present

## 2017-05-04 DIAGNOSIS — R438 Other disturbances of smell and taste: Secondary | ICD-10-CM

## 2017-05-04 DIAGNOSIS — B370 Candidal stomatitis: Secondary | ICD-10-CM

## 2017-05-04 DIAGNOSIS — H66011 Acute suppurative otitis media with spontaneous rupture of ear drum, right ear: Secondary | ICD-10-CM

## 2017-05-04 DIAGNOSIS — E860 Dehydration: Secondary | ICD-10-CM

## 2017-05-04 DIAGNOSIS — F329 Major depressive disorder, single episode, unspecified: Secondary | ICD-10-CM | POA: Diagnosis not present

## 2017-05-04 DIAGNOSIS — R5381 Other malaise: Secondary | ICD-10-CM

## 2017-05-04 DIAGNOSIS — R69 Illness, unspecified: Secondary | ICD-10-CM | POA: Diagnosis not present

## 2017-05-04 MED ORDER — NYSTATIN 100000 UNIT/ML MT SUSP: 5.0000 mL | Freq: Four times a day (QID) | OROMUCOSAL | 0 refills | Status: DC

## 2017-05-04 NOTE — Progress Notes (Signed)
Name: Lindsay Stephenson   MRN: [id number]    DOB: [birth year]   Date:05/04/2017       Progress Note  Subjective  Chief Complaint  Chief Complaint  Patient presents with  . Sore Throat    Went to UC 2 weeks ago with sore throat and cough.   . 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 Epistaxis    Sore Throat   This is a new problem. The current episode started in the past 7 days. The problem has been gradually improving. The pain is worse on the left side. There has been no fever. The pain is mild. Associated symptoms include ear discharge and a plugged ear sensation. Pertinent negatives include no abdominal pain, congestion, coughing, diarrhea, drooling, ear pain, headaches, hoarse voice, neck pain, shortness of breath, stridor, swollen glands, trouble swallowing or vomiting. Associated symptoms comments: Nausea/ metallic taste/ . She has had no exposure to strep or mono. The treatment provided moderate relief.  Epistaxis      No problem-specific Assessment & Plan notes found for this encounter.   Past Medical History:  Diagnosis Date  . Allergy   . Anemia    TAKING IRON  . Arthritis    KNEES AND HANDS  . Asthma    COUGHING, NO ATTACKS IN A YEAR OR 2  . Dyspnea    ON EXERTION  . GERD (gastroesophageal reflux disease)   . HOH (hard of hearing)    30-35 % HEARING LOSS  . Hyperlipidemia   . Hypertension    HX OF  . LABD (linear immunoglobulin A bullous dermatosis)   . Mitral valve prolapse   . Mitral valve prolapse   . 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 Parkinson's disease ([place])   . Rectal bleeding   . Sleep apnea    CPAP  . Vertigo     Past Surgical History:  Procedure Laterality Date  . ABDOMINAL HYSTERECTOMY    . APPENDECTOMY    . COLONOSCOPY WITH PROPOFOL N/A 01/16/2016   Procedure: COLONOSCOPY WITH PROPOFOL;  Surgeon: Lucilla Lame, MD;  Location: [place];  Service: Endoscopy;  Laterality: N/A;  CPAP  .  KNEE SURGERY     2013 and 2014  . POLYPECTOMY  01/16/2016   Procedure: POLYPECTOMY;  Surgeon: Lucilla Lame, MD;  Location: [place];  Service: Endoscopy;;  . REPLACEMENT TOTAL KNEE BILATERAL Bilateral    2013, 2014  . TONSILLECTOMY     ADENOIDECTOMY    Family History  Problem Relation Age of Onset  . Osteoporosis Mother   . Chronic Renal Failure Father   . Arthritis/Rheumatoid Sister   . Stroke Sister   . Fibromyalgia Sister   . Breast cancer Sister 52  . Diabetes Brother     Social History   Socioeconomic History  . Marital status: Married    Spouse name: Not on file  . Number of children: Not on file  . Years of education: Not on file  . Highest education level: Not on file  Social Needs  . Financial resource strain: Not on file  . Food insecurity - worry: Not on file  . Food insecurity - inability: Not on file  . Transportation needs - medical: Not on file  . Transportation needs - non-medical: Not on file  Occupational History  . Occupation: retired    Comment: high school french  Tobacco Use  . Smoking status: Former Smoker  Years: 3.00    Types: Cigarettes    Last attempt to quit: 1969    Years since quitting: 50.1  . Smokeless tobacco: Never Used  Substance and Sexual Activity  . Alcohol use: No    Comment: 4-5 times a year  . Drug use: No  . Sexual activity: Not Currently  Other Topics Concern  . Not on file  Social History Narrative   She is a retired Camera operator.   She lives with husband and son in a one story home.  Has 2 children.   Moved here from [place].   Highest level of education:  Masters     Allergies  Allergen Reactions  . Entex Lq [Phenylephrine-Guaifenesin] Swelling    TONGUE  . Codeine Hives and Other (See Comments)    Pt reports only having symptoms after receiving large amounts of medication  . Sulfa Antibiotics Itching  . Tape Other (See Comments)    CAN PULL OFF LAYER OF SKIN    Outpatient Medications Prior  to Visit  Medication Sig Dispense Refill  . albuterol (PROVENTIL HFA;VENTOLIN HFA) 108 (90 Base) MCG/ACT inhaler Inhale 1-2 puffs into the lungs every 6 (six) hours as needed for wheezing or shortness of breath. 1 Inhaler 1  . AMBULATORY NON FORMULARY MEDICATION 1 Units by Other route daily. Right AFO 1 Units 0  . amoxicillin-clavulanate (AUGMENTIN) 875-125 MG tablet Take 1 tablet by mouth 2 (two) times daily. 20 tablet 0  . Calcium Carbonate-Vit D-Min (CALCIUM 1200 PO) Take by mouth.    . Clobetasol Propionate 0.05 % shampoo     . folic acid (FOLVITE) 1 MG tablet [address]    . gabapentin (NEURONTIN) 100 MG capsule Take 100-200 mg by mouth at bedtime. Dr Sharol Roussel    . levocetirizine (XYZAL) 5 MG tablet Take 1 tablet (5 mg total) by mouth as needed. 90 tablet 1  . methotrexate 2.5 MG tablet     . Multiple Vitamin (MULTIVITAMIN) capsule Take 1 capsule by mouth daily.    Marland Kitchen neomycin-polymyxin-hydrocortisone (CORTISPORIN) 3.5-10000-1 OTIC suspension Place 4 drops into the right ear 3 (three) times daily. 10 mL 0  . omeprazole (PRILOSEC) 20 MG capsule Take 1 capsule (20 mg total) by mouth daily. 90 capsule 3  . polyethylene glycol (MIRALAX / GLYCOLAX) packet Take 17 g by mouth daily.    . predniSONE (DELTASONE) 5 MG tablet Take 10 mg by mouth.     . triamcinolone cream (KENALOG) 0.1 %     . TRIANEX 0.05 % OINT     . amoxicillin (AMOXIL) 500 MG capsule Take 1 capsule (500 mg total) by mouth 3 (three) times daily. 1 po 1 hour before dental work 4 capsule 1  . lidocaine (XYLOCAINE) 2 % solution Use as directed 20 mLs in the mouth or throat every 6 (six) hours as needed for mouth pain. 100 mL 0   No facility-administered medications prior to visit.     Review of Systems  Constitutional: Negative for chills, fever, malaise/fatigue and weight loss.  HENT: Positive for ear discharge, nosebleeds and sore throat. Negative for congestion, drooling, ear pain, hoarse voice, sinus pain and trouble  swallowing.   Eyes: Negative for blurred vision.  Respiratory: Negative for cough, sputum production, shortness of breath, wheezing and stridor.   Cardiovascular: Negative for chest pain, palpitations and leg swelling.  Gastrointestinal: Negative for abdominal pain, blood in stool, constipation, diarrhea, heartburn, melena, nausea and vomiting.  Genitourinary: Negative for dysuria, frequency, hematuria and  urgency.  Musculoskeletal: Negative for back pain, joint pain, myalgias and neck pain.  Skin: Negative for rash.  Neurological: Negative for dizziness, tingling, sensory change, focal weakness and headaches.  Endo/Heme/Allergies: Negative for environmental allergies and polydipsia. Does not bruise/bleed easily.  Psychiatric/Behavioral: Negative for depression and suicidal ideas. The patient is not nervous/anxious and does not have insomnia.      Objective  Vitals:   05/04/17 1015  BP: 128/84  Pulse: 100  Resp: 16  Temp: 98.7 F (37.1 C)  TempSrc: Oral  SpO2: 98%  Weight: 195 lb (88.5 kg)  Height: 5' 1"  (1.549 m)    Physical Exam  Constitutional: She is well-developed, well-nourished, and in no distress. No distress.  HENT:  Head: Normocephalic and atraumatic.  Right Ear: Tympanic membrane, external ear and ear canal normal.  Left Ear: Tympanic membrane, external ear and ear canal normal.  Nose: Nose normal. No mucosal edema.  Mouth/Throat: Oropharynx is clear and moist. No oropharyngeal exudate, posterior oropharyngeal edema or posterior oropharyngeal erythema.  coating  Eyes: Conjunctivae and EOM are normal. Pupils are equal, round, and reactive to light. Right eye exhibits no discharge. Left eye exhibits no discharge.  Neck: Normal range of motion. Neck supple. No JVD present. No thyromegaly present.  Cardiovascular: Normal rate, regular rhythm, normal heart sounds and intact distal pulses. Exam reveals no gallop and no friction rub.  No murmur heard. Pulmonary/Chest:  Effort normal and breath sounds normal. She has no wheezes. She has no rales.  Abdominal: Soft. Bowel sounds are normal. She exhibits no mass. There is no tenderness. There is no guarding.  Musculoskeletal: Normal range of motion. She exhibits no edema.  Lymphadenopathy:    She has no cervical adenopathy.  Neurological: She is alert. She has normal reflexes.  Skin: Skin is warm and dry. She is not diaphoretic.  Psychiatric: Mood and affect normal.  Nursing note and vitals reviewed.     Assessment & Plan  Problem List Items Addressed This Visit      Other   Metallic taste    Other Visit Diagnoses    Acute suppurative otitis media of right ear with spontaneous rupture of tympanic membrane, recurrence not specified    -  Primary   improving   Dehydration       fluids discused/Biotine   Oral candidiasis       Relevant Medications   nystatin (MYCOSTATIN) 100000 UNIT/ML suspension   Malaise and fatigue       Reactive depression          Meds ordered this encounter  Medications  . nystatin (MYCOSTATIN) 100000 UNIT/ML suspension    Sig: Take 5 mLs (500,000 Units total) by mouth 4 (four) times daily.    Dispense:  60 mL    Refill:  0      Dr. Otilio Miu [facility]  05/04/17

## 2017-05-04 NOTE — Patient Instructions (Addendum)
Dehydration, Adult Dehydration is a condition in which there is not enough fluid or water in the body. This happens when you lose more fluids than you take in. Important organs, such as the kidneys, brain, and heart, cannot function without a proper amount of fluids. Any loss of fluids from the body can lead to dehydration. Dehydration can range from mild to severe. This condition should be treated right away to prevent it from becoming severe. What are the causes? This condition may be caused by:  Vomiting.  Diarrhea.  Excessive sweating, such as from heat exposure or exercise.  Not drinking enough fluid, especially: ? When ill. ? While doing activity that requires a lot of energy.  Excessive urination.  Fever.  Infection.  Certain medicines, such as medicines that cause the body to lose excess fluid (diuretics).  Inability to access safe drinking water.  Reduced physical ability to get adequate water and food.  What increases the risk? This condition is more likely to develop in people:  Who have a poorly controlled long-term (chronic) illness, such as diabetes, heart disease, or kidney disease.  Who are age 69 or older.  Who are disabled.  Who live in a place with high altitude.  Who play endurance sports.  What are the signs or symptoms? Symptoms of mild dehydration may include:  Thirst.  Dry lips.  Slightly dry mouth.  Dry, warm skin.  Dizziness. Symptoms of moderate dehydration may include:  Very dry mouth.  Muscle cramps.  Dark urine. Urine may be the color of tea.  Decreased urine production.  Decreased tear production.  Heartbeat that is irregular or faster than normal (palpitations).  Headache.  Light-headedness, especially when you stand up from a sitting position.  Fainting (syncope). Symptoms of severe dehydration may include:  Changes in skin, such as: ? Cold and clammy skin. ? Blotchy (mottled) or pale skin. ? Skin that does  not quickly return to normal after being lightly pinched and released (poor skin turgor).  Changes in body fluids, such as: ? Extreme thirst. ? No tear production. ? Inability to sweat when body temperature is high, such as in hot weather. ? Very little urine production.  Changes in vital signs, such as: ? Weak pulse. ? Pulse that is more than 100 beats a minute when sitting still. ? Rapid breathing. ? Low blood pressure.  Other changes, such as: ? Sunken eyes. ? Cold hands and feet. ? Confusion. ? Lack of energy (lethargy). ? Difficulty waking up from sleep. ? Short-term weight loss. ? Unconsciousness. How is this diagnosed? This condition is diagnosed based on your symptoms and a physical exam. Blood and urine tests may be done to help confirm the diagnosis. How is this treated? Treatment for this condition depends on the severity. Mild or moderate dehydration can often be treated at home. Treatment should be started right away. Do not wait until dehydration becomes severe. Severe dehydration is an emergency and it needs to be treated in a hospital. Treatment for mild dehydration may include:  Drinking more fluids.  Replacing salts and minerals in your blood (electrolytes) that you may have lost. Treatment for moderate dehydration may include:  Drinking an oral rehydration solution (ORS). This is a drink that helps you replace fluids and electrolytes (rehydrate). It can be found at pharmacies and retail stores. Treatment for severe dehydration may include:  Receiving fluids through an IV tube.  Receiving an electrolyte solution through a feeding tube that is passed through your nose  and into your stomach (nasogastric tube, or NG tube).  Correcting any abnormalities in electrolytes.  Treating the underlying cause of dehydration. Follow these instructions at home:  If directed by your health care provider, drink an ORS: ? Make an ORS by following instructions on the  package. ? Start by drinking small amounts, about  cup (120 mL) every 5-10 minutes. ? Slowly increase how much you drink until you have taken the amount recommended by your health care provider.  Drink enough clear fluid to keep your urine clear or pale yellow. If you were told to drink an ORS, finish the ORS first, then start slowly drinking other clear fluids. Drink fluids such as: ? Water. Do not drink only water. Doing that can lead to having too little salt (sodium) in the body (hyponatremia). ? Ice chips. ? Fruit juice that you have added water to (diluted fruit juice). ? Low-calorie sports drinks.  Avoid: ? Alcohol. ? Drinks that contain a lot of sugar. These include high-calorie sports drinks, fruit juice that is not diluted, and soda. ? Caffeine. ? Foods that are greasy or contain a lot of fat or sugar.  Take over-the-counter and prescription medicines only as told by your health care provider.  Do not take sodium tablets. This can lead to having too much sodium in the body (hypernatremia).  Eat foods that contain a healthy balance of electrolytes, such as bananas, oranges, potatoes, tomatoes, and spinach.  Keep all follow-up visits as told by your health care provider. This is important. Contact a health care provider if:  You have abdominal pain that: ? Gets worse. ? Stays in one area (localizes).  You have a rash.  You have a stiff neck.  You are more irritable than usual.  You are sleepier or more difficult to wake up than usual.  You feel weak or dizzy.  You feel very thirsty.  You have urinated only a small amount of very dark urine over 6-8 hours. Get help right away if:  You have symptoms of severe dehydration.  You cannot drink fluids without vomiting.  Your symptoms get worse with treatment.  You have a fever.  You have a severe headache.  You have vomiting or diarrhea that: ? Gets worse. ? Does not go away.  You have blood or green matter  (bile) in your vomit.  You have blood in your stool. This may cause stool to look black and tarry.  You have not urinated in 6-8 hours.  You faint.  Your heart rate while sitting still is over 100 beats a minute.  You have trouble breathing. This information is not intended to replace advice given to you by your health care provider. Make sure you discuss any questions you have with your health care provider. Document Released: 03/22/2005 Document Revised: 10/17/2015 Document Reviewed: 05/16/2015 Elsevier Interactive Patient Education  2018 [employer].  Major Depressive Disorder, Adult Major depressive disorder (MDD) is a mental health condition. MDD often makes you feel sad, hopeless, or helpless. MDD can also cause symptoms in your body. MDD can affect your:  Work.  School.  Relationships.  Other normal activities.  MDD can range from mild to very bad. It may occur once (single episode MDD). It can also occur many times (recurrent MDD). The main symptoms of MDD often include:  Feeling sad, depressed, or irritable most of the time.  Loss of interest.  MDD symptoms also include:  Sleeping too much or too little.  Eating too  much or too little.  A change in your weight.  Feeling tired (fatigue) or having low energy.  Feeling worthless.  Feeling guilty.  Trouble making decisions.  Trouble thinking clearly.  Thoughts of suicide or harming others.  Feeling weak.  Feeling agitated.  Keeping yourself from being around other people (isolation).  Follow these instructions at home: Activity  Do these things as told by your doctor: ? Go back to your normal activities. ? Exercise regularly. ? Spend time outdoors. Alcohol  Talk with your doctor about how alcohol can affect your antidepressant medicines.  Do not drink alcohol. Or, limit how much alcohol you drink. ? This means no more than 1 drink a day for nonpregnant women and 2 drinks a day for men. One  drink equals one of these:  12 oz of beer.  5 oz of wine.  1 oz of hard liquor. General instructions  Take over-the-counter and prescription medicines only as told by your doctor.  Eat a healthy diet.  Get plenty of sleep.  Find activities that you enjoy. Make time to do them.  Think about joining a support group. Your doctor may be able to suggest a group for you.  Keep all follow-up visits as told by your doctor. This is important. Where to find more information:  [employer] on Mental Illness: ? www.nami.[place]: ? https://carter.com/  National Suicide Prevention Lifeline: ? 909-168-0798. This is free, 24-hour help. Contact a doctor if:  Your symptoms get worse.  You have new symptoms. Get help right away if:  You self-harm.  You see, hear, taste, smell, or feel things that are not present (hallucinate). If you ever feel like you may hurt yourself or others, or have thoughts about taking your own life, get help right away. You can go to your nearest emergency department or call:  Your local emergency services (911 in the [place]  A suicide crisis helpline, such as the National Suicide Prevention Lifeline: ? 952-505-0290. This is open 24 hours a day.  This information is not intended to replace advice given to you by your health care provider. Make sure you discuss any questions you have with your health care provider. Document Released: 03/03/2015 Document Revised: 12/07/2015 Document Reviewed: 12/07/2015 Elsevier Interactive Patient Education  2017 [employer].

## 2017-05-09 ENCOUNTER — Emergency Department
Admission: EM | Admit: 2017-05-09 | Discharge: 2017-05-09 | Disposition: A | Discharge status: Home / Self Care | Payer: Medicare HMO | Attending: Emergency Medicine | Admitting: Emergency Medicine

## 2017-05-09 ENCOUNTER — Encounter: Payer: Self-pay | Admitting: Emergency Medicine

## 2017-05-09 ENCOUNTER — Other Ambulatory Visit: Payer: Self-pay

## 2017-05-09 ENCOUNTER — Emergency Department: Payer: Medicare HMO

## 2017-05-09 DIAGNOSIS — R197 Diarrhea, unspecified: Secondary | ICD-10-CM | POA: Diagnosis not present

## 2017-05-09 DIAGNOSIS — R109 Unspecified abdominal pain: Secondary | ICD-10-CM | POA: Diagnosis not present

## 2017-05-09 DIAGNOSIS — Z96653 Presence of artificial knee joint, bilateral: Secondary | ICD-10-CM | POA: Insufficient documentation

## 2017-05-09 DIAGNOSIS — R112 Nausea with vomiting, unspecified: Secondary | ICD-10-CM | POA: Diagnosis not present

## 2017-05-09 DIAGNOSIS — Z87891 Personal history of nicotine dependence: Secondary | ICD-10-CM | POA: Diagnosis not present

## 2017-05-09 DIAGNOSIS — G20 Parkinson's disease: Secondary | ICD-10-CM | POA: Insufficient documentation

## 2017-05-09 DIAGNOSIS — Z79899 Other long term (current) drug therapy: Secondary | ICD-10-CM | POA: Diagnosis not present

## 2017-05-09 DIAGNOSIS — E876 Hypokalemia: Secondary | ICD-10-CM | POA: Insufficient documentation

## 2017-05-09 DIAGNOSIS — J45909 Unspecified asthma, uncomplicated: Secondary | ICD-10-CM | POA: Insufficient documentation

## 2017-05-09 DIAGNOSIS — I10 Essential (primary) hypertension: Secondary | ICD-10-CM | POA: Diagnosis not present

## 2017-05-09 DIAGNOSIS — R1084 Generalized abdominal pain: Secondary | ICD-10-CM

## 2017-05-09 LAB — URINALYSIS, COMPLETE (UACMP) WITH MICROSCOPIC
BILIRUBIN URINE: NEGATIVE
GLUCOSE, UA: NEGATIVE mg/dL
Ketones, ur: 80 mg/dL — AB
NITRITE: NEGATIVE
PH: 5 (ref 5.0–8.0)
Protein, ur: 100 mg/dL — AB
Specific Gravity, Urine: 1.018 (ref 1.005–1.030)

## 2017-05-09 LAB — COMPREHENSIVE METABOLIC PANEL
ALBUMIN: 3.8 g/dL (ref 3.5–5.0)
ALK PHOS: 62 U/L (ref 38–126)
ALT: 12 U/L — ABNORMAL LOW (ref 14–54)
ANION GAP: 18 — AB (ref 5–15)
AST: 16 U/L (ref 15–41)
BUN: 7 mg/dL (ref 6–20)
CALCIUM: 9.2 mg/dL (ref 8.9–10.3)
CO2: 19 mmol/L — ABNORMAL LOW (ref 22–32)
Chloride: 102 mmol/L (ref 101–111)
Creatinine, Ser: 0.55 mg/dL (ref 0.44–1.00)
GFR calc non Af Amer: >60 mL/min (ref >60)
GLUCOSE: 87 mg/dL (ref 65–99)
POTASSIUM: 2.7 mmol/L — AB (ref 3.5–5.1)
Sodium: 139 mmol/L (ref 135–145)
TOTAL PROTEIN: 7 g/dL (ref 6.5–8.1)
Total Bilirubin: 1.1 mg/dL (ref 0.3–1.2)

## 2017-05-09 LAB — CBC
HEMATOCRIT: 44.9 % (ref 35.0–47.0)
HEMOGLOBIN: 14.7 g/dL (ref 12.0–16.0)
MCH: 30.9 pg (ref 26.0–34.0)
MCHC: 32.8 g/dL (ref 32.0–36.0)
MCV: 94.3 fL (ref 80.0–100.0)
Platelets: 272 10*3/uL (ref 150–440)
RBC: 4.76 MIL/uL (ref 3.80–5.20)
RDW: 14.3 % (ref 11.5–14.5)
WBC: 12.6 10*3/uL — AB (ref 3.6–11.0)

## 2017-05-09 LAB — LIPASE, BLOOD: Lipase: 38 U/L (ref 11–51)

## 2017-05-09 LAB — MAGNESIUM: Magnesium: 1.7 mg/dL (ref 1.7–2.4)

## 2017-05-09 MED ORDER — IOPAMIDOL (ISOVUE-300) INJECTION 61%
100.0000 mL | Freq: Once | INTRAVENOUS | Status: AC | PRN
  Administered 2017-05-09: 100 mL via INTRAVENOUS

## 2017-05-09 MED ORDER — ONDANSETRON HCL 4 MG/2ML IJ SOLN
4.0000 mg | Freq: Once | INTRAMUSCULAR | Status: AC
Start: 2017-05-09 — End: 2017-05-09
  Administered 2017-05-09: 4 mg via INTRAVENOUS
  Filled 2017-05-09: qty 2

## 2017-05-09 MED ORDER — METOCLOPRAMIDE HCL 5 MG/ML IJ SOLN
10.0000 mg | Freq: Once | INTRAMUSCULAR | Status: AC
Start: 2017-05-09 — End: 2017-05-09
  Administered 2017-05-09: 10 mg via INTRAVENOUS
  Filled 2017-05-09: qty 2

## 2017-05-09 MED ORDER — ONDANSETRON 4 MG PO TBDP: 4.0000 mg | ORAL_TABLET | Freq: Three times a day (TID) | ORAL | 0 refills | Status: DC | PRN

## 2017-05-09 MED ORDER — SODIUM CHLORIDE 0.9 % IV BOLUS (SEPSIS)
1000.0000 mL | Freq: Once | INTRAVENOUS | Status: AC
  Administered 2017-05-09: 1000 mL via INTRAVENOUS

## 2017-05-09 MED ORDER — POTASSIUM CHLORIDE CRYS ER 20 MEQ PO TBCR
40.0000 meq | EXTENDED_RELEASE_TABLET | Freq: Once | ORAL | Status: AC
  Administered 2017-05-09: 40 meq via ORAL
  Filled 2017-05-09: qty 2

## 2017-05-09 MED ORDER — POTASSIUM CHLORIDE 10 MEQ/100ML IV SOLN
10.0000 meq | Freq: Once | INTRAVENOUS | Status: AC
  Administered 2017-05-09: 10 meq via INTRAVENOUS
  Filled 2017-05-09: qty 100

## 2017-05-09 NOTE — ED Notes (Signed)
Patient transported to CT 

## 2017-05-09 NOTE — ED Notes (Signed)
Pt stating decreased appetite for the last 3 weeks with an intermintent sore throat. Pt stating that she was on Augmentin and it has made her nauseated and "I have had every adverse reaction." Pt stating that she is having a lot of diarrhea that is "foul smelling." Pt in NAD at this time.

## 2017-05-09 NOTE — ED Notes (Signed)
Pt given labeled urine cup and will try later for urine.

## 2017-05-09 NOTE — Discharge Instructions (Signed)

## 2017-05-09 NOTE — ED Provider Notes (Signed)
[facility] Emergency Department Provider Note  ____________________________________________  Time seen: Approximately 3:33 PM  I have reviewed the triage vital signs and the nursing notes.   HISTORY  Chief Complaint Abdominal Pain and Diarrhea   HPI Lindsay Stephenson is a 72 y.o. 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 She reports 2 weeks ago she developed a right-sided ear infection. She was put on Augmentin. Since then she has had decreased oral intake, severe nausea, and diarrhea. She then developed oral thrush and was given a one-time dose of fluconazole which she took 3 days ago. Since taking the fluconazole, her nausea has become worse and her diarrhea has become worse. She reports hourly BMs which have very foul smell. No melena, no fever, no chills/ Patient is also complaining of lower abdominal pain which is cramping in nature, constant, moderate and non radiating. No prior h/o C. Diff. No dysuria or hematuria.  Past Medical History:  Diagnosis Date  . Allergy   . Anemia    TAKING IRON  . Arthritis    KNEES AND HANDS  . Asthma    COUGHING, NO ATTACKS IN A YEAR OR 2  . Dyspnea    ON EXERTION  . GERD (gastroesophageal reflux disease)   . HOH (hard of hearing)    30-35 % HEARING LOSS  . Hyperlipidemia   . Hypertension    HX OF  . LABD (linear immunoglobulin A bullous dermatosis)   . Mitral valve prolapse   . Mitral valve prolapse   . 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 Parkinson's disease ([place])   . Rectal bleeding   . Sleep apnea    CPAP  . Vertigo     Patient Active Problem List   Diagnosis Date Noted  . Metallic taste 01/74/9449  . 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 DDD (degenerative disc disease), lumbar 03/22/2016  . Vitamin B12  deficiency 03/22/2016  . Vitamin D deficiency, unspecified 03/22/2016  . Abnormality of gait due to impairment of balance 03/01/2016  . Arthralgia of multiple joints 03/01/2016  . Blood in stool   . Benign neoplasm of transverse colon   . Inflammatory bowel disease   . Paroxysmal supraventricular tachycardia ([place]) 11/18/2015  . Shortness of breath 10/29/2015  . H/O fall 10/23/2015  . 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 Hyperlipidemia with target LDL less than 100 10/01/2015  . Hypertension, essential, benign 10/01/2015  . Esophageal reflux 10/01/2015  . LABD (linear immunoglobulin A bullous dermatosis) 10/01/2015  . OSA on CPAP 12/12/2013  . Pelvic relaxation due to cystocele 09/01/2012  . Rectocele 09/01/2012  . Vaginal vault prolapse, posthysterectomy 09/01/2012  . Primary osteoarthritis of left knee 11/04/2011    Past Surgical History:  Procedure Laterality Date  . ABDOMINAL HYSTERECTOMY    . APPENDECTOMY    . COLONOSCOPY WITH PROPOFOL N/A 01/16/2016   Procedure: COLONOSCOPY WITH PROPOFOL;  Surgeon: Lucilla Lame, MD;  Location: [place];  Service: Endoscopy;  Laterality: N/A;  CPAP  . KNEE SURGERY     2013 and 2014  . POLYPECTOMY  01/16/2016   Procedure: POLYPECTOMY;  Surgeon: Lucilla Lame, MD;  Location: [place];  Service: Endoscopy;;  . REPLACEMENT TOTAL KNEE BILATERAL Bilateral    2013, 2014  . TONSILLECTOMY     ADENOIDECTOMY    Prior to Admission medications  Medication Sig Start Date End Date Taking? Authorizing Provider  albuterol (PROVENTIL HFA;VENTOLIN HFA) 108 (90 Base) MCG/ACT inhaler Inhale 1-2 puffs into the lungs every 6 (six) hours as needed for wheezing or shortness of breath. 01/05/16   Juline Patch, MD  AMBULATORY NON FORMULARY MEDICATION 1 Units by Other route daily. Right AFO 03/08/17   Narda Amber K, DO  amoxicillin-clavulanate (AUGMENTIN) 875-125 MG tablet Take 1 tablet by mouth 2 (two) times daily. 04/24/17   Norval Gable,  MD  Calcium Carbonate-Vit D-Min (CALCIUM 1200 PO) Take by mouth.    [provider]  Clobetasol Propionate 0.05 % shampoo  01/28/16   [provider]  folic acid (FOLVITE) 1 MG tablet Adrian [address]water [address] 09/16/16   [provider]  gabapentin (NEURONTIN) 100 MG capsule Take 100-200 mg by mouth at bedtime. Dr Sharol Roussel 10/12/16   [provider]  levocetirizine (XYZAL) 5 MG tablet Take 1 tablet (5 mg total) by mouth as needed. 11/11/16   Juline Patch, MD  methotrexate 2.5 MG tablet  09/20/16   [provider]  Multiple Vitamin (MULTIVITAMIN) capsule Take 1 capsule by mouth daily.    [provider]  neomycin-polymyxin-hydrocortisone (CORTISPORIN) 3.5-10000-1 OTIC suspension Place 4 drops into the right ear 3 (three) times daily. 04/24/17   Norval Gable, MD  nystatin (MYCOSTATIN) 100000 UNIT/ML suspension Take 5 mLs (500,000 Units total) by mouth 4 (four) times daily. 05/04/17   Juline Patch, MD  omeprazole (PRILOSEC) 20 MG capsule Take 1 capsule (20 mg total) by mouth daily. 02/02/17   Juline Patch, MD  ondansetron (ZOFRAN ODT) 4 MG disintegrating tablet Take 1 tablet (4 mg total) by mouth every 8 (eight) hours as needed for nausea or vomiting. 05/09/17   Alfred Levins, [place], MD  polyethylene glycol [facility] / Floria Raveling) packet Take 17 g by mouth daily.    [provider]  predniSONE (DELTASONE) 5 MG tablet Take 10 mg by mouth.  11/23/16   [provider]  triamcinolone cream (KENALOG) 0.1 %  07/16/16   [provider]  TRIANEX 0.05 % OINT  07/19/16   [provider]    Allergies Entex lq [phenylephrine-guaifenesin]; Codeine; Sulfa antibiotics; and Tape  Family History  Problem Relation Age of Onset  . Osteoporosis Mother   . Chronic Renal Failure Father   . Arthritis/Rheumatoid Sister   . Stroke Sister   . Fibromyalgia Sister   . Breast cancer Sister 44  . Diabetes Brother     Social History Social  History   Tobacco Use  . Smoking status: Former Smoker    Years: 3.00    Types: Cigarettes    Last attempt to quit: 1969    Years since quitting: 50.1  . Smokeless tobacco: Never Used  Substance Use Topics  . Alcohol use: No    Comment: 4-5 times a year  . Drug use: No    Review of Systems  Constitutional: Negative for fever. Eyes: Negative for visual changes. ENT: Negative for sore throat. Neck: No neck pain  Cardiovascular: Negative for chest pain. Respiratory: Negative for shortness of breath. Gastrointestinal: + abdominal pain, nausea, vomiting, and diarrhea. Genitourinary: Negative for dysuria. Musculoskeletal: Negative for back pain. Skin: Negative for rash. Neurological: Negative for headaches, weakness or numbness. Psych: No SI or HI  ____________________________________________   PHYSICAL EXAM:  VITAL SIGNS: ED Triage Vitals  Enc Vitals Group     BP 05/09/17 1254 129/74     Pulse Rate 05/09/17 1254 Marland Kitchen)  104     Resp 05/09/17 1254 20     Temp 05/09/17 1254 98.3 F (36.8 C)     Temp Source 05/09/17 1254 Oral     SpO2 05/09/17 1254 97 %     Weight 05/09/17 1255 190 lb (86.2 kg)     Height 05/09/17 1255 5' 1"  (1.549 m)     Head Circumference --      Peak Flow --      Pain Score 05/09/17 1253 5     Pain Loc --      Pain Edu? --      Excl. in [place]? --     Constitutional: Alert and oriented. Well appearing and in no apparent distress. HEENT:      Head: Normocephalic and atraumatic.         Eyes: Conjunctivae are normal. Sclera is non-icteric.       Mouth/Throat: Mucous membranes are dry.       Neck: Supple with no signs of meningismus. Cardiovascular: tachycardic with regular rhythm. No murmurs, gallops, or rubs. 2+ symmetrical distal pulses are present in all extremities. No JVD. Respiratory: Normal respiratory effort. Lungs are clear to auscultation bilaterally. No wheezes, crackles, or rhonchi.  Gastrointestinal: Soft, non tender, and non distended  with positive bowel sounds. No rebound or guarding. Genitourinary: No CVA tenderness. Musculoskeletal: Nontender with normal range of motion in all extremities. No edema, cyanosis, or erythema of extremities. Neurologic: Normal speech and language. Face is symmetric. Moving all extremities. No gross focal neurologic deficits are appreciated. Skin: Skin is warm, dry and intact. No rash noted. Psychiatric: Mood and affect are normal. Speech and behavior are normal.  ____________________________________________   LABS (all labs ordered are listed, but only abnormal results are displayed)  Labs Reviewed  COMPREHENSIVE METABOLIC PANEL - Abnormal; Notable for the following components:      Result Value   Potassium 2.7 (*)    CO2 19 (*)    ALT 12 (*)    Anion gap 18 (*)    All other components within normal limits  CBC - Abnormal; Notable for the following components:   WBC 12.6 (*)    All other components within normal limits  URINALYSIS, COMPLETE (UACMP) WITH MICROSCOPIC - Abnormal; Notable for the following components:   Color, Urine YELLOW (*)    APPearance HAZY (*)    Hgb urine dipstick LARGE (*)    Ketones, ur 80 (*)    Protein, ur 100 (*)    Leukocytes, UA SMALL (*)    Bacteria, UA RARE (*)    Squamous Epithelial / LPF 0-5 (*)    All other components within normal limits  URINE CULTURE  C DIFFICILE QUICK SCREEN W PCR REFLEX  LIPASE, BLOOD  MAGNESIUM   ____________________________________________  EKG  none  ____________________________________________  RADIOLOGY  Interpreted by me: CT a/p: no acute process in th abdomen or pelvis   Interpretation by Radiologist:  Ct Abdomen Pelvis W Contrast  Result Date: 05/09/2017 CLINICAL DATA:  Abdominal pain for several days EXAM: CT ABDOMEN AND PELVIS WITH CONTRAST TECHNIQUE: Multidetector CT imaging of the abdomen and pelvis was performed using the standard protocol following bolus administration of intravenous contrast.  CONTRAST:  154m ISOVUE-300 IOPAMIDOL (ISOVUE-300) INJECTION 61% COMPARISON:  None. FINDINGS: Lower chest: Lung bases are free of acute infiltrate or sizable effusion. Moderate-sized hiatal hernia is noted. Hepatobiliary: No focal liver abnormality is seen. No gallstones, gallbladder wall thickening, or biliary dilatation. Pancreas: Unremarkable. No pancreatic ductal  dilatation or surrounding inflammatory changes. Spleen: Normal in size without focal abnormality. Adrenals/Urinary Tract: The adrenal glands are within normal limits bilaterally. Kidneys demonstrate a few small cysts in the inferior aspect of the right kidney as well as scattered nonobstructing left renal calculi. Bladder is decompressed. Stomach/Bowel: The appendix has been surgically removed. Scattered diverticular change of the colon is seen. No significant pericolonic inflammatory changes to suggest diverticulitis are noted. Wall thickening is noted throughout the sigmoid colon consistent with the underlying diverticular disease. No obstructive or inflammatory changes are seen. Vascular/Lymphatic: Aortic atherosclerosis. Some scattered small mesenteric lymph nodes are noted particularly on the left likely reactive in nature. Reproductive: Status post hysterectomy. No adnexal masses. Other: No abdominal wall hernia or abnormality. No abdominopelvic ascites. Musculoskeletal: Degenerative change of the lumbar spine is noted. IMPRESSION: Hiatal hernia. Diverticulosis without definitive diverticulitis. Scattered small mesenteric lymph nodes are noted likely reactive in nature. Nonobstructing left renal stones. Electronically Signed   By: Inez Catalina M.D.   On: 05/09/2017 16:27     ____________________________________________   PROCEDURES  Procedure(s) performed: None Procedures Critical Care performed:  None ____________________________________________   INITIAL IMPRESSION / ASSESSMENT AND PLAN / ED COURSE  72 y.o. 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 patient looks dry on exam, slightly tachycardic however remaining of her vital signs are within normal limits, her abdomen is soft with no tenderness throughout. Labs showing hypokalemia which was supplemented PO and IV, mildly elevated AG and ketonuria for which she was given IVF. She received antiemetics and is now feeling improved and tolerating PO. Patient was in the room for 6 hours and only had one episode of diarrhea which was mixed with stool and therefore lab cancelled the specimen and order. Low suspicion for C. Diff with one BM in 6 hours but recommended that she f/u with her PCP if the diarrhea persists tomorrow. Her UA showed rare bacteria and small leuks but no nitrites and patient is asymptomatic. Since patient is asymptomatic, afebrile and has had so many complications from recent abx I will hold off abx until culture is back. CT a/p with no evidence of colitis or diverticulitis. Will dc home now with close f/u with PCP. Discussed return precautions with patient and husband.       As part of my medical decision making, I reviewed the following data within the [place] History obtained from family, Nursing notes reviewed and incorporated, Labs reviewed , Old chart reviewed, Radiograph reviewed , Notes from prior ED visits and [place] Controlled Substance Database    Pertinent labs & imaging results that were available during my care of the patient were reviewed by me and considered in my medical decision making (see chart for details).    ____________________________________________   FINAL CLINICAL IMPRESSION(S) / ED DIAGNOSES  Final diagnoses:  Nausea vomiting and diarrhea  Generalized abdominal pain    Hypokalemia      NEW MEDICATIONS STARTED DURING THIS VISIT:  ED Discharge Orders        Ordered    ondansetron (ZOFRAN ODT) 4 MG disintegrating tablet  Every 8 hours PRN     05/09/17 2035       Note:  This document was prepared using Dragon voice recognition software and may include unintentional dictation errors.  Rudene Re, MD 05/09/17 2103

## 2017-05-09 NOTE — ED Triage Notes (Signed)
Abdominal pain x 3 days. Decreased appetite x 3 days. Has been being seen for ear infection for 3 weeks Augmentin and fluconazole tablet one.

## 2017-05-11 ENCOUNTER — Other Ambulatory Visit
Admission: RE | Admit: 2017-05-11 | Discharge: 2017-05-11 | Disposition: A | Discharge status: Home / Self Care | Payer: Medicare HMO | Source: Ambulatory Visit | Attending: Family Medicine | Admitting: Family Medicine

## 2017-05-11 ENCOUNTER — Other Ambulatory Visit: Payer: Self-pay

## 2017-05-11 DIAGNOSIS — D72829 Elevated white blood cell count, unspecified: Secondary | ICD-10-CM | POA: Insufficient documentation

## 2017-05-11 DIAGNOSIS — A498 Other bacterial infections of unspecified site: Secondary | ICD-10-CM

## 2017-05-11 DIAGNOSIS — E876 Hypokalemia: Secondary | ICD-10-CM | POA: Insufficient documentation

## 2017-05-11 DIAGNOSIS — R197 Diarrhea, unspecified: Secondary | ICD-10-CM | POA: Diagnosis not present

## 2017-05-11 LAB — CLOSTRIDIUM DIFFICILE BY PCR, REFLEXED: CDIFFPCR: POSITIVE — AB

## 2017-05-11 LAB — CBC WITH DIFFERENTIAL/PLATELET
BASOS PCT: 0 %
Basophils Absolute: 0 10*3/uL (ref 0–0.1)
EOS ABS: 0.2 10*3/uL (ref 0–0.7)
Eosinophils Relative: 2 %
HCT: 40.3 % (ref 35.0–47.0)
Hemoglobin: 13.5 g/dL (ref 12.0–16.0)
LYMPHS ABS: 1.1 10*3/uL (ref 1.0–3.6)
Lymphocytes Relative: 9 %
MCH: 31.5 pg (ref 26.0–34.0)
MCHC: 33.6 g/dL (ref 32.0–36.0)
MCV: 93.8 fL (ref 80.0–100.0)
MONO ABS: 0.8 10*3/uL (ref 0.2–0.9)
MONOS PCT: 7 %
Neutro Abs: 9.7 10*3/uL — ABNORMAL HIGH (ref 1.4–6.5)
Neutrophils Relative %: 82 %
Platelets: 238 10*3/uL (ref 150–440)
RBC: 4.3 MIL/uL (ref 3.80–5.20)
RDW: 14.5 % (ref 11.5–14.5)
WBC: 11.8 10*3/uL — ABNORMAL HIGH (ref 3.6–11.0)

## 2017-05-11 LAB — COMPREHENSIVE METABOLIC PANEL
ALBUMIN: 3.4 g/dL — AB (ref 3.5–5.0)
ALT: 12 U/L — ABNORMAL LOW (ref 14–54)
ANION GAP: 13 (ref 5–15)
AST: 15 U/L (ref 15–41)
Alkaline Phosphatase: 56 U/L (ref 38–126)
BILIRUBIN TOTAL: 1 mg/dL (ref 0.3–1.2)
BUN: <5 mg/dL — ABNORMAL LOW (ref 6–20)
CALCIUM: 8.4 mg/dL — AB (ref 8.9–10.3)
CO2: 17 mmol/L — ABNORMAL LOW (ref 22–32)
Chloride: 107 mmol/L (ref 101–111)
Creatinine, Ser: 0.51 mg/dL (ref 0.44–1.00)
GLUCOSE: 90 mg/dL (ref 65–99)
POTASSIUM: 2.8 mmol/L — AB (ref 3.5–5.1)
Sodium: 137 mmol/L (ref 135–145)
TOTAL PROTEIN: 6.3 g/dL — AB (ref 6.5–8.1)

## 2017-05-11 LAB — C DIFFICILE QUICK SCREEN W PCR REFLEX
C DIFFICILE (CDIFF) TOXIN: NEGATIVE
C DIFFICLE (CDIFF) ANTIGEN: POSITIVE — AB

## 2017-05-11 LAB — URINE CULTURE

## 2017-05-11 MED ORDER — POTASSIUM CHLORIDE CRYS ER 20 MEQ PO TBCR: 20.0000 meq | EXTENDED_RELEASE_TABLET | Freq: Two times a day (BID) | ORAL | 1 refills | Status: AC

## 2017-05-11 MED ORDER — METRONIDAZOLE 500 MG PO TABS: 500.0000 mg | ORAL_TABLET | Freq: Three times a day (TID) | ORAL | 0 refills | Status: DC

## 2017-05-12 ENCOUNTER — Ambulatory Visit: Payer: Medicare HMO | Admitting: Family Medicine

## 2017-05-12 ENCOUNTER — Telehealth: Payer: Self-pay

## 2017-05-12 NOTE — Telephone Encounter (Signed)
Called pt to see if she was coming in for her appt today at 2:00. She stated she was able to eat oatmeal this morning and her stools are brown and mushy. She said she is "very weak, can hardly get around". She also said that she didn't get her meds until late yesterday afternoon. She has had the pill last night, this am and was getting ready to take another dose now. She thought since she "came in yesterday, she didn't have to see Dr Ronnald Ramp today"- She only came in yesterday to pick up lab slip. I explained that Dr Ronnald Ramp still needed to see her, but continue to hydrate

## 2017-05-15 ENCOUNTER — Inpatient Hospital Stay
Admission: EM | Admit: 2017-05-15 | Discharge: 2017-05-24 | DRG: 371 | Disposition: A | Discharge status: Home Health | Payer: Medicare HMO | Attending: Internal Medicine | Admitting: Internal Medicine

## 2017-05-15 ENCOUNTER — Emergency Department: Payer: Medicare HMO

## 2017-05-15 ENCOUNTER — Other Ambulatory Visit: Payer: Self-pay

## 2017-05-15 ENCOUNTER — Encounter: Payer: Self-pay | Admitting: Emergency Medicine

## 2017-05-15 DIAGNOSIS — J45909 Unspecified asthma, uncomplicated: Secondary | ICD-10-CM | POA: Diagnosis present

## 2017-05-15 DIAGNOSIS — I10 Essential (primary) hypertension: Secondary | ICD-10-CM | POA: Diagnosis present

## 2017-05-15 DIAGNOSIS — H9190 Unspecified hearing loss, unspecified ear: Secondary | ICD-10-CM | POA: Diagnosis present

## 2017-05-15 DIAGNOSIS — Z90710 Acquired absence of both cervix and uterus: Secondary | ICD-10-CM

## 2017-05-15 DIAGNOSIS — M5136 Other intervertebral disc degeneration, lumbar region: Secondary | ICD-10-CM | POA: Diagnosis not present

## 2017-05-15 DIAGNOSIS — R109 Unspecified abdominal pain: Secondary | ICD-10-CM | POA: Diagnosis not present

## 2017-05-15 DIAGNOSIS — Z96653 Presence of artificial knee joint, bilateral: Secondary | ICD-10-CM | POA: Diagnosis present

## 2017-05-15 DIAGNOSIS — G4730 Sleep apnea, unspecified: Secondary | ICD-10-CM | POA: Diagnosis present

## 2017-05-15 DIAGNOSIS — I341 Nonrheumatic mitral (valve) prolapse: Secondary | ICD-10-CM | POA: Diagnosis present

## 2017-05-15 DIAGNOSIS — Z79899 Other long term (current) drug therapy: Secondary | ICD-10-CM

## 2017-05-15 DIAGNOSIS — Z7952 Long term (current) use of systemic steroids: Secondary | ICD-10-CM | POA: Diagnosis not present

## 2017-05-15 DIAGNOSIS — Z6837 Body mass index (BMI) 37.0-37.9, adult: Secondary | ICD-10-CM | POA: Diagnosis not present

## 2017-05-15 DIAGNOSIS — Z87891 Personal history of nicotine dependence: Secondary | ICD-10-CM

## 2017-05-15 DIAGNOSIS — E43 Unspecified severe protein-calorie malnutrition: Secondary | ICD-10-CM | POA: Diagnosis present

## 2017-05-15 DIAGNOSIS — A0472 Enterocolitis due to Clostridium difficile, not specified as recurrent: Secondary | ICD-10-CM | POA: Diagnosis present

## 2017-05-15 DIAGNOSIS — A498 Other bacterial infections of unspecified site: Secondary | ICD-10-CM

## 2017-05-15 DIAGNOSIS — K625 Hemorrhage of anus and rectum: Secondary | ICD-10-CM | POA: Diagnosis not present

## 2017-05-15 DIAGNOSIS — G20 Parkinson's disease: Secondary | ICD-10-CM | POA: Diagnosis present

## 2017-05-15 DIAGNOSIS — E876 Hypokalemia: Secondary | ICD-10-CM | POA: Diagnosis present

## 2017-05-15 DIAGNOSIS — E785 Hyperlipidemia, unspecified: Secondary | ICD-10-CM | POA: Diagnosis present

## 2017-05-15 DIAGNOSIS — K219 Gastro-esophageal reflux disease without esophagitis: Secondary | ICD-10-CM | POA: Diagnosis present

## 2017-05-15 DIAGNOSIS — R110 Nausea: Secondary | ICD-10-CM | POA: Diagnosis not present

## 2017-05-15 DIAGNOSIS — R197 Diarrhea, unspecified: Secondary | ICD-10-CM | POA: Diagnosis not present

## 2017-05-15 LAB — GASTROINTESTINAL PANEL BY PCR, STOOL (REPLACES STOOL CULTURE)

## 2017-05-15 LAB — COMPREHENSIVE METABOLIC PANEL
ALK PHOS: 48 U/L (ref 38–126)
ALT: 9 U/L — ABNORMAL LOW (ref 14–54)
ANION GAP: 15 (ref 5–15)
AST: 18 U/L (ref 15–41)
Albumin: 2.5 g/dL — ABNORMAL LOW (ref 3.5–5.0)
BILIRUBIN TOTAL: 1.4 mg/dL — AB (ref 0.3–1.2)
BUN: <5 mg/dL — ABNORMAL LOW (ref 6–20)
CALCIUM: 8.3 mg/dL — AB (ref 8.9–10.3)
CO2: 18 mmol/L — AB (ref 22–32)
CREATININE: 0.56 mg/dL (ref 0.44–1.00)
Chloride: 106 mmol/L (ref 101–111)
Glucose, Bld: 88 mg/dL (ref 65–99)
Potassium: 3.2 mmol/L — ABNORMAL LOW (ref 3.5–5.1)
Sodium: 139 mmol/L (ref 135–145)
TOTAL PROTEIN: 4.9 g/dL — AB (ref 6.5–8.1)

## 2017-05-15 LAB — CBC WITH DIFFERENTIAL/PLATELET
BASOS PCT: 1 %
Basophils Absolute: 0.1 10*3/uL (ref 0–0.1)
EOS ABS: 0 10*3/uL (ref 0–0.7)
EOS PCT: 1 %
HCT: 43.9 % (ref 35.0–47.0)
HEMOGLOBIN: 14.7 g/dL (ref 12.0–16.0)
LYMPHS ABS: 1 10*3/uL (ref 1.0–3.6)
Lymphocytes Relative: 10 %
MCH: 31.4 pg (ref 26.0–34.0)
MCHC: 33.5 g/dL (ref 32.0–36.0)
MCV: 93.8 fL (ref 80.0–100.0)
MONO ABS: 1 10*3/uL — AB (ref 0.2–0.9)
MONOS PCT: 10 %
NEUTROS PCT: 78 %
Neutro Abs: 8.2 10*3/uL — ABNORMAL HIGH (ref 1.4–6.5)
Platelets: 189 10*3/uL (ref 150–440)
RBC: 4.67 MIL/uL (ref 3.80–5.20)
RDW: 15.1 % — AB (ref 11.5–14.5)
WBC: 10.4 10*3/uL (ref 3.6–11.0)

## 2017-05-15 LAB — URINALYSIS, COMPLETE (UACMP) WITH MICROSCOPIC
Bacteria, UA: NONE SEEN
Bilirubin Urine: NEGATIVE
Glucose, UA: NEGATIVE mg/dL
KETONES UR: 80 mg/dL — AB
Nitrite: NEGATIVE
PH: 6 (ref 5.0–8.0)
PROTEIN: NEGATIVE mg/dL
Specific Gravity, Urine: >1.046 — ABNORMAL HIGH (ref 1.005–1.030)
Squamous Epithelial / LPF: NONE SEEN

## 2017-05-15 LAB — MAGNESIUM: Magnesium: 1.6 mg/dL — ABNORMAL LOW (ref 1.7–2.4)

## 2017-05-15 LAB — LIPASE, BLOOD: LIPASE: 40 U/L (ref 11–51)

## 2017-05-15 LAB — LACTIC ACID, PLASMA: Lactic Acid, Venous: 1.1 mmol/L (ref 0.5–1.9)

## 2017-05-15 MED ORDER — ONDANSETRON HCL 4 MG PO TABS: 4.0000 mg | ORAL_TABLET | Freq: Four times a day (QID) | ORAL | Status: DC | PRN

## 2017-05-15 MED ORDER — VANCOMYCIN 50 MG/ML ORAL SOLUTION
125.0000 mg | Freq: Once | ORAL | Status: AC
  Administered 2017-05-15: 125 mg via ORAL
  Filled 2017-05-15: qty 2.5

## 2017-05-15 MED ORDER — FOLIC ACID 1 MG PO TABS
1.0000 mg | ORAL_TABLET | Freq: Every day | ORAL | Status: DC
  Administered 2017-05-17 – 2017-05-24 (×8): 1 mg via ORAL
  Filled 2017-05-15 (×8): qty 1

## 2017-05-15 MED ORDER — VANCOMYCIN 50 MG/ML ORAL SOLUTION
125.0000 mg | Freq: Four times a day (QID) | ORAL | Status: DC
  Administered 2017-05-16 – 2017-05-24 (×34): 125 mg via ORAL
  Filled 2017-05-15 (×38): qty 2.5

## 2017-05-15 MED ORDER — ACETAMINOPHEN 325 MG PO TABS: 650.0000 mg | ORAL_TABLET | Freq: Four times a day (QID) | ORAL | Status: DC | PRN

## 2017-05-15 MED ORDER — IOPAMIDOL (ISOVUE-300) INJECTION 61%
100.0000 mL | Freq: Once | INTRAVENOUS | Status: AC | PRN
  Administered 2017-05-15: 100 mL via INTRAVENOUS

## 2017-05-15 MED ORDER — NEOMYCIN-POLYMYXIN-HC 3.5-10000-1 OT SUSP: 4.0000 [drp] | Freq: Three times a day (TID) | OTIC | Status: DC

## 2017-05-15 MED ORDER — POTASSIUM CHLORIDE CRYS ER 20 MEQ PO TBCR
20.0000 meq | EXTENDED_RELEASE_TABLET | Freq: Two times a day (BID) | ORAL | Status: DC
  Administered 2017-05-15 – 2017-05-16 (×2): 20 meq via ORAL
  Filled 2017-05-15 (×2): qty 1

## 2017-05-15 MED ORDER — NYSTATIN 100000 UNIT/ML MT SUSP
5.0000 mL | Freq: Once | OROMUCOSAL | Status: AC
  Administered 2017-05-15: 500000 [IU] via ORAL
  Filled 2017-05-15: qty 5

## 2017-05-15 MED ORDER — ONDANSETRON HCL 4 MG/2ML IJ SOLN
4.0000 mg | Freq: Four times a day (QID) | INTRAMUSCULAR | Status: DC | PRN
  Administered 2017-05-15 – 2017-05-17 (×2): 4 mg via INTRAVENOUS
  Filled 2017-05-15 (×2): qty 2

## 2017-05-15 MED ORDER — SODIUM CHLORIDE 0.9 % IV SOLN
INTRAVENOUS | Status: DC
  Administered 2017-05-15 – 2017-05-16 (×2): via INTRAVENOUS

## 2017-05-15 MED ORDER — SODIUM CHLORIDE 0.9 % IV BOLUS (SEPSIS)
1000.0000 mL | Freq: Once | INTRAVENOUS | Status: AC
  Administered 2017-05-15: 1000 mL via INTRAVENOUS

## 2017-05-15 MED ORDER — KETOROLAC TROMETHAMINE 15 MG/ML IJ SOLN: 15.0000 mg | Freq: Four times a day (QID) | INTRAMUSCULAR | Status: AC | PRN

## 2017-05-15 MED ORDER — METRONIDAZOLE IN NACL 5-0.79 MG/ML-% IV SOLN
500.0000 mg | Freq: Once | INTRAVENOUS | Status: AC
  Administered 2017-05-15: 500 mg via INTRAVENOUS
  Filled 2017-05-15: qty 100

## 2017-05-15 MED ORDER — ALBUTEROL SULFATE (2.5 MG/3ML) 0.083% IN NEBU: 2.5000 mg | INHALATION_SOLUTION | RESPIRATORY_TRACT | Status: DC | PRN

## 2017-05-15 MED ORDER — GI COCKTAIL ~~LOC~~
30.0000 mL | Freq: Once | ORAL | Status: AC
  Administered 2017-05-15: 30 mL via ORAL
  Filled 2017-05-15: qty 30

## 2017-05-15 MED ORDER — ENOXAPARIN SODIUM 40 MG/0.4ML ~~LOC~~ SOLN
40.0000 mg | SUBCUTANEOUS | Status: DC
  Administered 2017-05-15 – 2017-05-23 (×9): 40 mg via SUBCUTANEOUS
  Filled 2017-05-15 (×9): qty 0.4

## 2017-05-15 MED ORDER — ACETAMINOPHEN 650 MG RE SUPP: 650.0000 mg | Freq: Four times a day (QID) | RECTAL | Status: DC | PRN

## 2017-05-15 MED ORDER — IOPAMIDOL (ISOVUE-300) INJECTION 61%
15.0000 mL | INTRAVENOUS | Status: AC
  Administered 2017-05-15: 30 mL via ORAL
  Administered 2017-05-15: 15 mL via ORAL

## 2017-05-15 MED ORDER — ONDANSETRON HCL 4 MG/2ML IJ SOLN
4.0000 mg | Freq: Once | INTRAMUSCULAR | Status: AC
  Administered 2017-05-15: 4 mg via INTRAVENOUS
  Filled 2017-05-15: qty 2

## 2017-05-15 MED ORDER — GABAPENTIN 100 MG PO CAPS
100.0000 mg | ORAL_CAPSULE | Freq: Every day | ORAL | Status: DC
  Administered 2017-05-15 – 2017-05-20 (×6): 100 mg via ORAL
  Filled 2017-05-15 (×9): qty 1

## 2017-05-15 MED ORDER — PREDNISONE 20 MG PO TABS
10.0000 mg | ORAL_TABLET | Freq: Every day | ORAL | Status: DC
  Administered 2017-05-17 – 2017-05-24 (×8): 10 mg via ORAL
  Filled 2017-05-15 (×9): qty 1

## 2017-05-15 NOTE — ED Notes (Addendum)
April, RN to transport pt to [id number].

## 2017-05-15 NOTE — ED Provider Notes (Signed)
[facility] Emergency Department Provider Note ____________________________________________   First MD Initiated Contact with Patient 05/15/17 1513     (approximate)  I have reviewed the triage vital signs and the nursing notes.   HISTORY  Chief Complaint Rectal Bleeding and Abdominal Pain    HPI Lindsay Stephenson is a 72 y.o. female with past medical history as noted below and recent diagnosis of C. difficile who presents with persistent diarrhea over the last 7 to 10 days, persistent course, occurring approximately 4-5 times per day, and described as watery but becoming more formed.  Patient reports that today she had bright red blood in the stool as well.  The patient states that she has been increasingly weak and tired, and states that the diarrhea is associated with intermittent but severe lower abdominal pain, as well as nausea and dry heaving.  The patient reports that in mid January she had a sinus infection was treated with Augmentin, and afterwards developed thrush as well as the diarrhea.  She was seen in the ED here 6 days ago for similar symptoms, had a CT which was negative for colitis, and was sent home.  Later this week she was diagnosed with C. difficile and started on metronidazole which she has been taking for the last 4 days.  She states that the symptoms have only slightly improved while she has been on this.   Past Medical History:  Diagnosis Date  . Allergy   . Anemia    TAKING IRON  . Arthritis    KNEES AND HANDS  . Asthma    COUGHING, NO ATTACKS IN A YEAR OR 2  . Dyspnea    ON EXERTION  . GERD (gastroesophageal reflux disease)   . HOH (hard of hearing)    30-35 % HEARING LOSS  . Hyperlipidemia   . Hypertension    HX OF  . LABD (linear immunoglobulin A bullous dermatosis)   . Mitral valve prolapse   . Mitral valve prolapse   . 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 Parkinson's disease ([place])     . Rectal bleeding   . Sleep apnea    CPAP  . Vertigo     Patient Active Problem List   Diagnosis Date Noted  . Metallic taste 09/40/7680  . 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 DDD (degenerative disc disease), lumbar 03/22/2016  . Vitamin B12 deficiency 03/22/2016  . Vitamin D deficiency, unspecified 03/22/2016  . Abnormality of gait due to impairment of balance 03/01/2016  . Arthralgia of multiple joints 03/01/2016  . Blood in stool   . Benign neoplasm of transverse colon   . Inflammatory bowel disease   . Paroxysmal supraventricular tachycardia ([place]) 11/18/2015  . Shortness of breath 10/29/2015  . H/O fall 10/23/2015  . 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 Hyperlipidemia with target LDL less than 100 10/01/2015  . Hypertension, essential, benign 10/01/2015  . Esophageal reflux 10/01/2015  . LABD (linear immunoglobulin A bullous dermatosis) 10/01/2015  . OSA on CPAP 12/12/2013  . Pelvic relaxation due to cystocele 09/01/2012  . Rectocele 09/01/2012  . Vaginal vault prolapse, posthysterectomy 09/01/2012  . Primary osteoarthritis of left knee 11/04/2011    Past Surgical History:  Procedure Laterality Date  . ABDOMINAL HYSTERECTOMY    . APPENDECTOMY    . COLONOSCOPY WITH PROPOFOL N/A 01/16/2016   Procedure: COLONOSCOPY WITH PROPOFOL;  Surgeon: Lucilla Lame, MD;  Location: [place];  Service: Endoscopy;  Laterality: N/A;  CPAP  . KNEE SURGERY     2013 and 2014  . POLYPECTOMY  01/16/2016   Procedure: POLYPECTOMY;  Surgeon: Lucilla Lame, MD;  Location: [place];  Service: Endoscopy;;  . REPLACEMENT TOTAL KNEE BILATERAL Bilateral    2013, 2014  . TONSILLECTOMY     ADENOIDECTOMY    Prior to Admission medications   Medication Sig Start Date End Date Taking? Authorizing Provider  albuterol (PROVENTIL HFA;VENTOLIN HFA) 108 (90 Base) MCG/ACT inhaler Inhale 1-2 puffs into the lungs every 6 (six) hours as needed for wheezing or shortness of breath.  01/05/16   Juline Patch, MD  AMBULATORY NON FORMULARY MEDICATION 1 Units by Other route daily. Right AFO 03/08/17   Narda Amber K, DO  amoxicillin-clavulanate (AUGMENTIN) 875-125 MG tablet Take 1 tablet by mouth 2 (two) times daily. Patient not taking: Reported on 05/15/2017 04/24/17   Norval Gable, MD  Calcium Carbonate-Vit D-Min (CALCIUM 1200 PO) Take by mouth.    [provider]  Clobetasol Propionate 0.05 % shampoo  01/28/16   [provider]  folic acid (FOLVITE) 1 MG tablet Adrian [place] 09/16/16   [provider]  gabapentin (NEURONTIN) 100 MG capsule Take 100-200 mg by mouth at bedtime. Dr Sharol Roussel 10/12/16   [provider]  levocetirizine (XYZAL) 5 MG tablet Take 1 tablet (5 mg total) by mouth as needed. 11/11/16   Juline Patch, MD  methotrexate 2.5 MG tablet  09/20/16   [provider]  metroNIDAZOLE (FLAGYL) 500 MG tablet Take 1 tablet (500 mg total) by mouth 3 (three) times daily. 05/11/17   Juline Patch, MD  Multiple Vitamin (MULTIVITAMIN) capsule Take 1 capsule by mouth daily.    [provider]  neomycin-polymyxin-hydrocortisone (CORTISPORIN) 3.5-10000-1 OTIC suspension Place 4 drops into the right ear 3 (three) times daily. 04/24/17   Norval Gable, MD  nystatin (MYCOSTATIN) 100000 UNIT/ML suspension Take 5 mLs (500,000 Units total) by mouth 4 (four) times daily. 05/04/17   Juline Patch, MD  omeprazole (PRILOSEC) 20 MG capsule Take 1 capsule (20 mg total) by mouth daily. 02/02/17   Juline Patch, MD  ondansetron (ZOFRAN ODT) 4 MG disintegrating tablet Take 1 tablet (4 mg total) by mouth every 8 (eight) hours as needed for nausea or vomiting. 05/09/17   Alfred Levins, [place], MD  polyethylene glycol [facility] / Floria Raveling) packet Take 17 g by mouth daily.    [provider]  potassium chloride SA (K-DUR,KLOR-CON) 20 MEQ tablet Take 1 tablet (20 mEq total) by mouth 2 (two) times daily. 05/11/17   Juline Patch, MD    predniSONE (DELTASONE) 5 MG tablet Take 10 mg by mouth.  11/23/16   [provider]  triamcinolone cream (KENALOG) 0.1 %  07/16/16   [provider]  TRIANEX 0.05 % OINT  07/19/16   [provider]    Allergies Entex lq [phenylephrine-guaifenesin]; Codeine; Sulfa antibiotics; and Tape  Family History  Problem Relation Age of Onset  . Osteoporosis Mother   . Chronic Renal Failure Father   . Arthritis/Rheumatoid Sister   . Stroke Sister   . Fibromyalgia Sister   . Breast cancer Sister 8  . Diabetes Brother     Social History Social History   Tobacco Use  . Smoking status: Former Smoker    Years: 3.00    Types: Cigarettes    Last attempt to quit: 1969    Years since quitting: 50.1  . Smokeless tobacco: Never  Used  Substance Use Topics  . Alcohol use: No    Comment: 4-5 times a year  . Drug use: No    Review of Systems  Constitutional: No fever.  Positive for generalized weakness. Eyes: No redness. ENT: No sore throat. Cardiovascular: Denies chest pain. Respiratory: Denies shortness of breath. Gastrointestinal: Positive for nausea and diarrhea.  Genitourinary: Negative for dysuria.  Musculoskeletal: Negative for back pain. Skin: Negative for rash. Neurological: Negative for headache.   ____________________________________________   PHYSICAL EXAM:  VITAL SIGNS: ED Triage Vitals  Enc Vitals Group     BP 05/15/17 1450 127/68     Pulse Rate 05/15/17 1450 96     Resp 05/15/17 1450 18     Temp 05/15/17 1450 97.9 F (36.6 C)     Temp Source 05/15/17 1450 Oral     SpO2 05/15/17 1437 99 %     Weight 05/15/17 1452 190 lb (86.2 kg)     Height 05/15/17 1452 5' (1.524 m)     Head Circumference --      Peak Flow --      Pain Score 05/15/17 1449 6     Pain Loc --      Pain Edu? --      Excl. in [place]? --     Constitutional: Alert and oriented.  Relatively well appearing and in no acute distress. Eyes: Conjunctivae are normal.  No scleral  icterus. Head: Atraumatic. Nose: No congestion/rhinnorhea. Mouth/Throat: Mucous membranes are somewhat dry.   Neck: Normal range of motion.  Cardiovascular:  Good peripheral circulation. Respiratory: Normal respiratory effort.  No retractions.  Gastrointestinal: Soft with bilateral lower quadrant moderate tenderness.  Genitourinary: No flank tenderness. Musculoskeletal: Extremities warm and well perfused.  Neurologic:  Normal speech and language. No gross focal neurologic deficits are appreciated.  Skin:  Skin is warm and dry. No rash noted. Psychiatric: Mood and affect are normal. Speech and behavior are normal.  ____________________________________________   LABS (all labs ordered are listed, but only abnormal results are displayed)  Labs Reviewed  CBC WITH DIFFERENTIAL/PLATELET - Abnormal; Notable for the following components:      Result Value   RDW 15.1 (*)    Neutro Abs 8.2 (*)    Monocytes Absolute 1.0 (*)    All other components within normal limits  URINALYSIS, COMPLETE (UACMP) WITH MICROSCOPIC - Abnormal; Notable for the following components:   Color, Urine YELLOW (*)    APPearance CLEAR (*)    Specific Gravity, Urine >1.046 (*)    Hgb urine dipstick LARGE (*)    Ketones, ur 80 (*)    Leukocytes, UA TRACE (*)    All other components within normal limits  COMPREHENSIVE METABOLIC PANEL - Abnormal; Notable for the following components:   Potassium 3.2 (*)    CO2 18 (*)    BUN <5 (*)    Calcium 8.3 (*)    Total Protein 4.9 (*)    Albumin 2.5 (*)    ALT 9 (*)    Total Bilirubin 1.4 (*)    All other components within normal limits  GASTROINTESTINAL PANEL BY PCR, STOOL (REPLACES STOOL CULTURE)  LACTIC ACID, PLASMA  LIPASE, BLOOD   ____________________________________________  EKG   ____________________________________________  RADIOLOGY  CT abdomen: Wall thickening in the sigmoid colon consistent with  colitis  ____________________________________________   PROCEDURES  Procedure(s) performed: No  Procedures  Critical Care performed: No ____________________________________________   INITIAL IMPRESSION / ASSESSMENT AND PLAN / ED COURSE  Pertinent  labs & imaging results that were available during my care of the patient were reviewed by me and considered in my medical decision making (see chart for details).  72 year old female with past medical history as noted above as well as recent diagnosis of C. difficile presents with persistent diarrhea, lower abdominal pain, nausea, generalized weakness, and today with blood in the stool.  I reviewed the past medical records in epic; the patient was treated with antibiotics for sinus infection last month, and subsequently developed the diarrhea.  She was seen in the ED on 05/09/2017 for this, had a CT which was negative, and was discharged with symptomatic treatment.  She was subsequently diagnosed with C. difficile on 05/11/2017 and started on metronidazole which she states that she has been taking.  She was also diagnosed with hypokalemia and started on potassium repletion.  On exam here, vital signs are normal, the patient is relatively well-appearing, and the remainder the exam is as described with somewhat dry mucous membranes and bilateral lower quadrant abdominal tenderness.  In terms of the persistent diarrhea and abdominal pain, the presentation is consistent with C. difficile colitis.  Given that patient last had imaging 1 week ago and symptoms have persisted, as well as the persistent lower quadrant tenderness and development of new blood in the stool, I will obtain a repeat CT to rule out significant colitis.  Differential for the generalized weakness includes dehydration, decreased p.o. intake, electrolyte abnormality such as persistent hypokalemia or another acute abnormality, or anemia.  Plan for IV fluids, lab workup to evaluate for these  etiologies, and reassess.  Depending on results of workup, consider admission for IV antibiotics.    ----------------------------------------- 7:36 PM on 05/15/2017 -----------------------------------------  CT confirms presence of colitis which was not seen previously, consistent with C. difficile.  Patient's lab workup is otherwise relatively reassuring.  However given her decreased p.o. intake, nausea and vomiting, ketones in the urine, and failure of outpatient therapy with metronidazole, patient is appropriate for admission for IV metronidazole and p.o. vancomycin.  Patient agrees with this plan.  I signed the patient out to the hospitalist Dr. Bridgett Larsson.  ____________________________________________   FINAL CLINICAL IMPRESSION(S) / ED DIAGNOSES  Final diagnoses:  C. difficile colitis      NEW MEDICATIONS STARTED DURING THIS VISIT:  New Prescriptions   No medications on file     Note:  This document was prepared using Dragon voice recognition software and may include unintentional dictation errors.    Arta Silence, MD 05/15/17 [phone]

## 2017-05-15 NOTE — ED Triage Notes (Signed)
Pt arrives via [facility] from home with complaints of bloody stool, increased weakness and lower abdominal pain that increases during bowel movements. Pt has been unable to eat or drink for several days.   Pt was here this week and dx with C. Diff. Pt has been taking medications daily for four days, including today. Per pt, stools have become more "normal looking" but began as bright red "balls" this morning. Pt has lower abdominal pain that increases while trying to have bowel movement.

## 2017-05-15 NOTE — ED Notes (Signed)
Pt changed into gown for comfort after using bed pan. Pt comfortable at this time. Husband at bedside. VSS. Skin warm and dry. A&O x4.

## 2017-05-15 NOTE — H&P (Signed)
[facility] [place] NAME: Avana Kreiser    MR#:  [id number]  DATE OF BIRTH:  Apr 01, 1946  DATE OF ADMISSION:  05/15/2017  PRIMARY CARE PHYSICIAN: Juline Patch, MD   REQUESTING/REFERRING PHYSICIAN: Arta Silence, MD  CHIEF COMPLAINT:   Chief Complaint  Patient presents with  . Rectal Bleeding  . Abdominal Pain   Abdominal pain and rectal bleeding. HISTORY OF PRESENT ILLNESS:  Karole Oo  is a 72 y.o. female with a known history of multiple medical problems as below.  Patient has a persistent diarrhea for the past 7-10 days.  She was diagnosed with C. difficile colitis and given p.o. Flagyl as outpatient.  Her symptoms has been worsening for the past 4 days.  She complains of abdominal cramp, which is intermittent, 8 out of 10 without radiation.  She also complains of rectal bleeding, nausea and diarrhea but no vomiting.  The CAT scan of the abdomen was negative for colitis several days ago.  But the CAT scan of the abdomen today show Inflammatory or infectious colitis as opposed to diverticulitis is suspected.  PAST MEDICAL HISTORY:   Past Medical History:  Diagnosis Date  . Allergy   . Anemia    TAKING IRON  . Arthritis    KNEES AND HANDS  . Asthma    COUGHING, NO ATTACKS IN A YEAR OR 2  . Dyspnea    ON EXERTION  . GERD (gastroesophageal reflux disease)   . HOH (hard of hearing)    30-35 % HEARING LOSS  . Hyperlipidemia   . Hypertension    HX OF  . LABD (linear immunoglobulin A bullous dermatosis)   . Mitral valve prolapse   . Mitral valve prolapse   . 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 Parkinson's disease ([place])   . Rectal bleeding   . Sleep apnea    CPAP  . Vertigo     PAST SURGICAL HISTORY:   Past Surgical History:  Procedure Laterality Date  . ABDOMINAL HYSTERECTOMY    . APPENDECTOMY    . COLONOSCOPY WITH PROPOFOL N/A 01/16/2016   Procedure: COLONOSCOPY WITH  PROPOFOL;  Surgeon: Lucilla Lame, MD;  Location: [facility];  Service: Endoscopy;  Laterality: N/A;  CPAP  . KNEE SURGERY     2013 and 2014  . POLYPECTOMY  01/16/2016   Procedure: POLYPECTOMY;  Surgeon: Lucilla Lame, MD;  Location: [facility];  Service: Endoscopy;;  . REPLACEMENT TOTAL KNEE BILATERAL Bilateral    2013, 2014  . TONSILLECTOMY     ADENOIDECTOMY    SOCIAL HISTORY:   Social History   Tobacco Use  . Smoking status: Former Smoker    Years: 3.00    Types: Cigarettes    Last attempt to quit: 1969    Years since quitting: 50.1  . Smokeless tobacco: Never Used  Substance Use Topics  . Alcohol use: No    Comment: 4-5 times a year    FAMILY HISTORY:   Family History  Problem Relation Age of Onset  . Osteoporosis Mother   . Chronic Renal Failure Father   . Arthritis/Rheumatoid Sister   . Stroke Sister   . Fibromyalgia Sister   . Breast cancer Sister 46  . Diabetes Brother     DRUG ALLERGIES:   Allergies  Allergen Reactions  . Entex Lq [Phenylephrine-Guaifenesin] Swelling    TONGUE  . Codeine Hives and Other (See Comments)  Pt reports only having symptoms after receiving large amounts of medication  . Sulfa Antibiotics Itching  . Tape Other (See Comments)    CAN PULL OFF LAYER OF SKIN    REVIEW OF SYSTEMS:   Review of Systems  Constitutional: Positive for malaise/fatigue. Negative for chills and fever.  HENT: Negative for sore throat.   Eyes: Negative for blurred vision and double vision.  Respiratory: Positive for cough. Negative for hemoptysis, shortness of breath, wheezing and stridor.   Cardiovascular: Negative for chest pain, palpitations, orthopnea and leg swelling.  Gastrointestinal: Positive for abdominal pain, blood in stool, diarrhea and nausea. Negative for melena and vomiting.  Genitourinary: Negative for dysuria, flank pain and hematuria.  Musculoskeletal: Negative for back pain and joint pain.  Skin: Negative for rash.    Neurological: Positive for weakness. Negative for dizziness, sensory change, focal weakness, seizures, loss of consciousness and headaches.  Endo/Heme/Allergies: Negative for polydipsia.  Psychiatric/Behavioral: Negative for depression. The patient is not nervous/anxious.     MEDICATIONS AT HOME:   Prior to Admission medications   Medication Sig Start Date End Date Taking? Authorizing Provider  albuterol (PROVENTIL HFA;VENTOLIN HFA) 108 (90 Base) MCG/ACT inhaler Inhale 1-2 puffs into the lungs every 6 (six) hours as needed for wheezing or shortness of breath. 01/05/16  Yes Juline Patch, MD  Calcium Carbonate-Vit D-Min (CALCIUM 1200 PO) Take by mouth.   Yes [provider]  folic acid (FOLVITE) 1 MG tablet Take 1 mg by mouth daily.  09/16/16  Yes [provider]  gabapentin (NEURONTIN) 100 MG capsule Take 100 mg by mouth at bedtime. Dr Sharol Roussel 10/12/16  Yes [provider]  levocetirizine (XYZAL) 5 MG tablet Take 1 tablet (5 mg total) by mouth as needed. 11/11/16  Yes Juline Patch, MD  methotrexate 2.5 MG tablet Take 10 mg by mouth once a week. Takes on Tuesdays 09/20/16  Yes [provider]  Multiple Vitamin (MULTIVITAMIN) capsule Take 1 capsule by mouth daily.   Yes [provider]  nystatin (MYCOSTATIN) 100000 UNIT/ML suspension Take 5 mLs (500,000 Units total) by mouth 4 (four) times daily. 05/04/17  Yes Juline Patch, MD  omeprazole (PRILOSEC) 20 MG capsule Take 1 capsule (20 mg total) by mouth daily. 02/02/17  Yes Juline Patch, MD  ondansetron (ZOFRAN ODT) 4 MG disintegrating tablet Take 1 tablet (4 mg total) by mouth every 8 (eight) hours as needed for nausea or vomiting. 05/09/17  Yes [place], [place], MD  polyethylene glycol [facility] / GLYCOLAX) packet Take 17 g by mouth every other day.    Yes [provider]  potassium chloride SA (K-DUR,KLOR-CON) 20 MEQ tablet Take 1 tablet (20 mEq total) by mouth 2 (two) times daily. 05/11/17   Yes Juline Patch, MD  predniSONE (DELTASONE) 5 MG tablet Take 10 mg by mouth.  11/23/16  Yes [provider]  AMBULATORY NON FORMULARY MEDICATION 1 Units by Other route daily. Right AFO 03/08/17   Narda Amber K, DO  amoxicillin-clavulanate (AUGMENTIN) 875-125 MG tablet Take 1 tablet by mouth 2 (two) times daily. Patient not taking: Reported on 05/15/2017 04/24/17   Norval Gable, MD  metroNIDAZOLE (FLAGYL) 500 MG tablet Take 1 tablet (500 mg total) by mouth 3 (three) times daily. Patient not taking: Reported on 05/15/2017 05/11/17   Juline Patch, MD  neomycin-polymyxin-hydrocortisone (CORTISPORIN) 3.5-10000-1 OTIC suspension Place 4 drops into the right ear 3 (three) times daily. Patient not taking: Reported on 05/15/2017 04/24/17   Norval Gable,  MD      VITAL SIGNS:  Blood pressure 140/72, pulse 100, temperature 97.9 F (36.6 C), temperature source Oral, resp. rate 15, height 5' (1.524 m), weight 190 lb (86.2 kg), SpO2 100 %.  PHYSICAL EXAMINATION:  Physical Exam  GENERAL:  72 y.o.-year-old patient lying in the bed with no acute distress.  EYES: Pupils equal, round, reactive to light and accommodation. No scleral icterus. Extraocular muscles intact.  HEENT: Head atraumatic, normocephalic. Oropharynx and nasopharynx clear.  NECK:  Supple, no jugular venous distention. No thyroid enlargement, no tenderness.  LUNGS: Normal breath sounds bilaterally, no wheezing, rales,rhonchi or crepitation. No use of accessory muscles of respiration.  CARDIOVASCULAR: S1, S2 normal. No murmurs, rubs, or gallops.  ABDOMEN: Soft, abdominal tenderness in RLQ, nondistended. Bowel sounds present. No organomegaly or mass.  EXTREMITIES: No pedal edema, cyanosis, or clubbing.  NEUROLOGIC: Cranial nerves II through XII are intact. Muscle strength 5/5 in all extremities except  2/5 on right leg, which is chronic due to neuropathy. Sensation intact. Gait not checked.  PSYCHIATRIC: The patient is alert and  oriented x 3.  SKIN: No obvious rash, lesion, or ulcer.   LABORATORY PANEL:   CBC Recent Labs  Lab 05/15/17 1547  WBC 10.4  HGB 14.7  HCT 43.9  PLT 189   ------------------------------------------------------------------------------------------------------------------  Chemistries  Recent Labs  Lab 05/09/17 1337  05/15/17 1758  NA  --    < > 139  K  --    < > 3.2*  CL  --    < > 106  CO2  --    < > 18*  GLUCOSE  --    < > 88  BUN  --    < > <5*  CREATININE  --    < > 0.56  CALCIUM  --    < > 8.3*  MG 1.7  --   --   AST  --    < > 18  ALT  --    < > 9*  ALKPHOS  --    < > 48  BILITOT  --    < > 1.4*   < > = values in this interval not displayed.   ------------------------------------------------------------------------------------------------------------------  Cardiac Enzymes No results for input(s): TROPONINI in the last 168 hours. ------------------------------------------------------------------------------------------------------------------  RADIOLOGY:  Ct Abdomen Pelvis W Contrast  Result Date: 05/15/2017 CLINICAL DATA:  Pt arrives via [facility] from home with complaints of bloody stool, increased weakness and lower abdominal pain that increases during bowel movements. Pt has been unable to eat or drink for several days. Pt was here this week and dx with C. Diff. Pt has been taking medications daily for four days, including today. Per pt, stools have become more "normal looking" but began as bright red "balls" this morning. Pt has lower abdominal pain that increases while trying to have bowel movement. EXAM: CT ABDOMEN AND PELVIS WITH CONTRAST TECHNIQUE: Multidetector CT imaging of the abdomen and pelvis was performed using the standard protocol following bolus administration of intravenous contrast. CONTRAST:  121m ISOVUE-300 IOPAMIDOL (ISOVUE-300) INJECTION 61% COMPARISON:  05/09/2017 FINDINGS: Lower chest: No acute abnormality. Hepatobiliary: No focal liver  abnormality is seen. No gallstones, gallbladder wall thickening, or biliary dilatation. Pancreas: Unremarkable. No pancreatic ductal dilatation or surrounding inflammatory changes. Spleen: Normal in size without focal abnormality. Adrenals/Urinary Tract: No adrenal masses. Tiny low-density renal lesions are noted, likely cysts. There are small nonobstructing stones in the lower pole the left kidney. No hydronephrosis. Ureters are normal  in course and in caliber. Bladder is unremarkable. Stomach/Bowel: Mild wall thickening is developed of the sigmoid colon. The descending colon is decompressed. There are numerous diverticula along the descending and sigmoid colon. There is subtle haziness in the fat adjacent to the proximal to mid sigmoid colon. The mild wall thickening of the sigmoid colon and this subtle hazy opacity in the sigmoid pericolonic fat are new findings since the prior CT. Remainder of the colon is unremarkable. Stomach shows a moderate size hiatal hernia but is otherwise unremarkable. Small bowel is within normal limits. Vascular/Lymphatic: Minor aortic atherosclerosis. There are prominent mesenteric lymph nodes adjacent to the sigmoid colon, none pathologically enlarged. There are also scattered subcentimeter retroperitoneal lymph nodes, also none pathologically enlarged. Reproductive: Status post hysterectomy. No adnexal masses. Other: Trace pelvic free fluid which was present on the prior study. No abdominal wall hernia. Musculoskeletal: No fracture or acute finding. No osteoblastic or osteolytic lesions. IMPRESSION: 1. Since the prior study, mild wall thickening has developed along the sigmoid colon with subtle hazy inflammatory type change in the pericolonic fat. This extends throughout the sigmoid colon, which is longer in extent than would be expected for diverticulitis. Inflammatory or infectious colitis as opposed to diverticulitis is suspected. 2. No other evidence of an acute abnormality  within the abdomen or pelvis. 3. Moderate hiatal hernia. 4. Stable nonobstructing stones in the lower pole the left kidney. 5. Minor aortic atherosclerosis. Electronically Signed   By: Lajean Manes M.D.   On: 05/15/2017 18:10      IMPRESSION AND PLAN:   C. difficile colitis Failed outpatient treatment. The patient is treated with p.o. vancomycin in the ED, continue vancomycin p.o.  Zofran as needed.  IV fluids support.  Rectal bleeding.  Follow hemoglobin and GI consult. Hypokalemia.  Give potassium supplement and follow-up potassium and magnesium level.  Hypertension, controlled.  All the records are reviewed and case discussed with ED provider. Management plans discussed with the patient, her husband and they are in agreement.  CODE STATUS: Full code  TOTAL TIME TAKING CARE OF THIS PATIENT: 56 minutes.    Demetrios Loll M.D on 05/15/2017 at 8:21 PM  Between 7am to 6pm - Pager - [phone]  After 6pm go to [email or web address] - Proofreader  [facility]  Office  [phone]  CC: Primary care physician; Juline Patch, MD   Note: This dictation was prepared with Dragon dictation along with smaller phrase technology. Any transcriptional errors that result from this process are unin

## 2017-05-15 NOTE — ED Notes (Signed)
Pt in CT scan. This RN went to start 2nd IV and re-collect light green tube. Will re-collect when pt returns.

## 2017-05-16 DIAGNOSIS — A0472 Enterocolitis due to Clostridium difficile, not specified as recurrent: Principal | ICD-10-CM

## 2017-05-16 LAB — CBC
HEMATOCRIT: 36.9 % (ref 35.0–47.0)
HEMOGLOBIN: 12.7 g/dL (ref 12.0–16.0)
MCH: 31.7 pg (ref 26.0–34.0)
MCHC: 34.4 g/dL (ref 32.0–36.0)
MCV: 92.2 fL (ref 80.0–100.0)
Platelets: 158 10*3/uL (ref 150–440)
RBC: 4 MIL/uL (ref 3.80–5.20)
RDW: 14.8 % — ABNORMAL HIGH (ref 11.5–14.5)
WBC: 7.6 10*3/uL (ref 3.6–11.0)

## 2017-05-16 LAB — BASIC METABOLIC PANEL
ANION GAP: 14 (ref 5–15)
CHLORIDE: 106 mmol/L (ref 101–111)
CO2: 18 mmol/L — ABNORMAL LOW (ref 22–32)
Calcium: 7.9 mg/dL — ABNORMAL LOW (ref 8.9–10.3)
Creatinine, Ser: 0.48 mg/dL (ref 0.44–1.00)
Glucose, Bld: 82 mg/dL (ref 65–99)
POTASSIUM: 2.1 mmol/L — AB (ref 3.5–5.1)
SODIUM: 138 mmol/L (ref 135–145)

## 2017-05-16 MED ORDER — POTASSIUM CHLORIDE 10 MEQ/100ML IV SOLN
10.0000 meq | INTRAVENOUS | Status: AC
  Administered 2017-05-16 (×4): 10 meq via INTRAVENOUS
  Filled 2017-05-16 (×4): qty 100

## 2017-05-16 MED ORDER — ALUM & MAG HYDROXIDE-SIMETH 200-200-20 MG/5ML PO SUSP
30.0000 mL | ORAL | Status: DC | PRN
  Administered 2017-05-16 – 2017-05-22 (×6): 30 mL via ORAL
  Filled 2017-05-16 (×6): qty 30

## 2017-05-16 MED ORDER — GI COCKTAIL ~~LOC~~
30.0000 mL | Freq: Once | ORAL | Status: AC
  Administered 2017-05-16: 30 mL via ORAL
  Filled 2017-05-16: qty 30

## 2017-05-16 MED ORDER — POTASSIUM CHLORIDE IN NACL 40-0.9 MEQ/L-% IV SOLN
INTRAVENOUS | Status: AC
  Administered 2017-05-16 – 2017-05-19 (×6): 75 mL/h via INTRAVENOUS
  Filled 2017-05-16 (×6): qty 1000

## 2017-05-16 MED ORDER — METRONIDAZOLE IN NACL 5-0.79 MG/ML-% IV SOLN
500.0000 mg | Freq: Three times a day (TID) | INTRAVENOUS | Status: DC
  Administered 2017-05-16 – 2017-05-24 (×24): 500 mg via INTRAVENOUS
  Filled 2017-05-16 (×27): qty 100

## 2017-05-16 MED ORDER — MAGNESIUM SULFATE 2 GM/50ML IV SOLN
2.0000 g | Freq: Once | INTRAVENOUS | Status: AC
  Administered 2017-05-16: 2 g via INTRAVENOUS
  Filled 2017-05-16: qty 50

## 2017-05-16 MED ORDER — PANTOPRAZOLE SODIUM 40 MG PO TBEC
40.0000 mg | DELAYED_RELEASE_TABLET | Freq: Every day | ORAL | Status: DC
  Administered 2017-05-16 – 2017-05-24 (×9): 40 mg via ORAL
  Filled 2017-05-16 (×9): qty 1

## 2017-05-16 NOTE — Progress Notes (Signed)
[facility] [place] NAME: Lindsay Stephenson    MR#:  [id number]  DATE OF BIRTH:  09-24-1945  SUBJECTIVE:  CHIEF COMPLAINT:   Chief Complaint  Patient presents with  . Rectal Bleeding  . Abdominal Pain  Received some antibiotic for her ear infection 2-3 weeks ago, since then have diarrhea, nausea, not able to eat enough and loss of strength. Came to emergency room last week and at urgent care Center found to have C. difficile so given oral Flagyl. She could not tolerate it and continued to have symptoms so came back and admitted for management of C. difficile with IV antibiotics. She also had some blood in her stool noted yesterday.  REVIEW OF SYSTEMS:  CONSTITUTIONAL: No fever, positive for fatigue or weakness.  EYES: No blurred or double vision.  EARS, NOSE, AND THROAT: No tinnitus or ear pain.  RESPIRATORY: No cough, shortness of breath, wheezing or hemoptysis.  CARDIOVASCULAR: No chest pain, orthopnea, edema.  GASTROINTESTINAL: No nausea, vomiting, positive for diarrhea or abdominal pain.  GENITOURINARY: No dysuria, hematuria.  ENDOCRINE: No polyuria, nocturia,  HEMATOLOGY: No anemia, easy bruising or bleeding SKIN: No rash or lesion. MUSCULOSKELETAL: No joint pain or arthritis.   NEUROLOGIC: No tingling, numbness, weakness.  PSYCHIATRY: No anxiety or depression.   ROS  DRUG ALLERGIES:   Allergies  Allergen Reactions  . Entex Lq [Phenylephrine-Guaifenesin] Swelling    TONGUE  . Codeine Hives and Other (See Comments)    Pt reports only having symptoms after receiving large amounts of medication  . Sulfa Antibiotics Itching  . Tape Other (See Comments)    CAN PULL OFF LAYER OF SKIN    VITALS:  Blood pressure 139/61, pulse 95, temperature 98.7 F (37.1 C), temperature source Oral, resp. rate 18, height 5' (1.524 m), weight 86.2 kg (190 lb), SpO2 100 %.  PHYSICAL EXAMINATION:  GENERAL:  72 y.o.-year-old patient lying in the bed with  no acute distress.  EYES: Pupils equal, round, reactive to light and accommodation. No scleral icterus. Extraocular muscles intact.  HEENT: Head atraumatic, normocephalic. Oropharynx and nasopharynx clear.  NECK:  Supple, no jugular venous distention. No thyroid enlargement, no tenderness.  LUNGS: Normal breath sounds bilaterally, no wheezing, rales,rhonchi or crepitation. No use of accessory muscles of respiration.  CARDIOVASCULAR: S1, S2 normal. No murmurs, rubs, or gallops.  ABDOMEN: Soft, tender, nondistended. Bowel sounds present. No organomegaly or mass.  EXTREMITIES: No pedal edema, cyanosis, or clubbing.  NEUROLOGIC: Cranial nerves II through XII are intact. Muscle strength 4/5 in all extremities. Sensation intact. Gait not checked.  PSYCHIATRIC: The patient is alert and oriented x 3.  SKIN: No obvious rash, lesion, or ulcer.   Physical Exam LABORATORY PANEL:   CBC Recent Labs  Lab 05/16/17 0415  WBC 7.6  HGB 12.7  HCT 36.9  PLT 158   ------------------------------------------------------------------------------------------------------------------  Chemistries  Recent Labs  Lab 05/15/17 1758 05/16/17 0415  NA 139 138  K 3.2* 2.1*  CL 106 106  CO2 18* 18*  GLUCOSE 88 82  BUN <5* <5*  CREATININE 0.56 0.48  CALCIUM 8.3* 7.9*  MG 1.6*  --   AST 18  --   ALT 9*  --   ALKPHOS 48  --   BILITOT 1.4*  --    ------------------------------------------------------------------------------------------------------------------  Cardiac Enzymes No results for input(s): TROPONINI in the last 168 hours. ------------------------------------------------------------------------------------------------------------------  RADIOLOGY:  Ct Abdomen Pelvis W Contrast  Result Date: 05/15/2017 CLINICAL DATA:  Pt  arrives via [facility] from home with complaints of bloody stool, increased weakness and lower abdominal pain that increases during bowel movements. Pt has been unable to eat or  drink for several days. Pt was here this week and dx with C. Diff. Pt has been taking medications daily for four days, including today. Per pt, stools have become more "normal looking" but began as bright red "balls" this morning. Pt has lower abdominal pain that increases while trying to have bowel movement. EXAM: CT ABDOMEN AND PELVIS WITH CONTRAST TECHNIQUE: Multidetector CT imaging of the abdomen and pelvis was performed using the standard protocol following bolus administration of intravenous contrast. CONTRAST:  118m ISOVUE-300 IOPAMIDOL (ISOVUE-300) INJECTION 61% COMPARISON:  05/09/2017 FINDINGS: Lower chest: No acute abnormality. Hepatobiliary: No focal liver abnormality is seen. No gallstones, gallbladder wall thickening, or biliary dilatation. Pancreas: Unremarkable. No pancreatic ductal dilatation or surrounding inflammatory changes. Spleen: Normal in size without focal abnormality. Adrenals/Urinary Tract: No adrenal masses. Tiny low-density renal lesions are noted, likely cysts. There are small nonobstructing stones in the lower pole the left kidney. No hydronephrosis. Ureters are normal in course and in caliber. Bladder is unremarkable. Stomach/Bowel: Mild wall thickening is developed of the sigmoid colon. The descending colon is decompressed. There are numerous diverticula along the descending and sigmoid colon. There is subtle haziness in the fat adjacent to the proximal to mid sigmoid colon. The mild wall thickening of the sigmoid colon and this subtle hazy opacity in the sigmoid pericolonic fat are new findings since the prior CT. Remainder of the colon is unremarkable. Stomach shows a moderate size hiatal hernia but is otherwise unremarkable. Small bowel is within normal limits. Vascular/Lymphatic: Minor aortic atherosclerosis. There are prominent mesenteric lymph nodes adjacent to the sigmoid colon, none pathologically enlarged. There are also scattered subcentimeter retroperitoneal lymph nodes,  also none pathologically enlarged. Reproductive: Status post hysterectomy. No adnexal masses. Other: Trace pelvic free fluid which was present on the prior study. No abdominal wall hernia. Musculoskeletal: No fracture or acute finding. No osteoblastic or osteolytic lesions. IMPRESSION: 1. Since the prior study, mild wall thickening has developed along the sigmoid colon with subtle hazy inflammatory type change in the pericolonic fat. This extends throughout the sigmoid colon, which is longer in extent than would be expected for diverticulitis. Inflammatory or infectious colitis as opposed to diverticulitis is suspected. 2. No other evidence of an acute abnormality within the abdomen or pelvis. 3. Moderate hiatal hernia. 4. Stable nonobstructing stones in the lower pole the left kidney. 5. Minor aortic atherosclerosis. Electronically Signed   By: DLajean ManesM.D.   On: 05/15/2017 18:10    ASSESSMENT AND PLAN:   Active Problems:   Clostridium difficile colitis  * C. difficile colitis Failed outpatient treatment.   continue vancomycin p.o.  Zofran as needed.  IV fluids support.  Appreciated consult by GI, added IV Flagyl.   Will call surgical consult if any worsening in the condition then we have to suspect toxic megacolon.  * Rectal bleeding.  Follow hemoglobin and GI consult. Hemoglobin stable, likely due to C. difficile colitis.     * Hypokalemia.  Give potassium supplement and follow-up potassium and magnesium level.   Admission is low, replace IV. Patient cannot take oral potassium tablets, will replace IV.   * Hypertension, controlled.   Monitor with IV fluid.   All the records are reviewed and case discussed with Care Management/Social Workerr. Management plans discussed with the patient, family and they are in agreement.  CODE STATUS: Full.  TOTAL TIME TAKING CARE OF THIS PATIENT: 35 minutes.     POSSIBLE D/C IN 1-2 DAYS, DEPENDING ON CLINICAL CONDITION.   Vaughan Basta M.D on 05/16/2017   Between 7am to 6pm - Pager - [phone]  After 6pm go to [email or web address] - password [email or web address] [facility]  Office  [phone]  CC: Primary care physician; Juline Patch, MD  Note: This dictation was prepared with Dragon dictation along with smaller phrase technology. Any transcriptional errors that result from this process are unintentional.

## 2017-05-16 NOTE — Progress Notes (Signed)
PT Cancellation Note  Patient Details Name: Lindsay Stephenson MRN: [id number] DOB: [birth year]   Cancelled Treatment:    Reason Eval/Treat Not Completed: Patient not medically ready.  Willl try again in the AM.   Ramond Dial 05/16/2017, 4:45 PM   Mee Hives, PT MS Acute Rehab Dept. Number: [place] [facility] [place]

## 2017-05-16 NOTE — Consult Note (Addendum)
Lindsay Antigua, MD [address] [place], [place], [place], [address] [address], [place], [place], [place], [address] Phone: [phone]  Fax: [phone]  Consultation  Referring Provider:     Dr. Anselm Jungling Primary Care Physician:  Juline Patch, MD Primary Gastroenterologist:  [facility] (Dr. Allen Norris)        Reason for Consultation:     Diarrhea, rectal bleeding  Date of Admission:  05/15/2017 Date of Consultation:  05/16/2017         HPI:   AMI Stephenson is a 72 y.o. female presents with 7-10-day history of 5-10 loose stools daily, watery diarrhea, associated with intermittent bright red blood per rectum.  No NSAID use.  Patient was in the ER on February 4 due to diarrhea, and at that time she had reported being on Augmentin recently for an ear infection, and fluconazole one-time dose for thrush.  In the ER patient only had 1 bowel movement in 6 hours and suspicion for C. difficile was low.  CT scan on that day showed "scattered diverticular change of the colon, no significant pericolonic inflammatory changes to suggest diverticulitis.  Wall thickening noted throughout the sigmoid colon consistent with underlying diverticular disease.  No obstructive or inflammatory changes seen."As an outpatient, her primary care provider ordered C. difficile testing which was positive and she was started on Flagyl on 05/11/2017.  After starting the Flagyl, she states the diarrhea had improved.  She returns to the ER due to persistent diarrhea, but also the new symptom of bright red blood per rectum that started yesterday.  Reports 6 loose bowel movements a day.  Lab work revealed hypokalemia.  White count is normal at 7.6 and was elevated to 11.8 and 12.6, 5-7 days ago.  Patient is afebrile.  Hemoglobin is normal at 12.7. repeat CT scan yesterday showed mild wall thickening in the sigmoid colon.  Subtle haziness in the fat adjacent to the proximal to mid sigmoid colon.  Past Medical History:    Diagnosis Date  . Allergy   . Anemia    TAKING IRON  . Arthritis    KNEES AND HANDS  . Asthma    COUGHING, NO ATTACKS IN A YEAR OR 2  . Dyspnea    ON EXERTION  . GERD (gastroesophageal reflux disease)   . HOH (hard of hearing)    30-35 % HEARING LOSS  . Hyperlipidemia   . Hypertension    HX OF  . LABD (linear immunoglobulin A bullous dermatosis)   . Mitral valve prolapse   . Mitral valve prolapse   . 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 Parkinson's disease ([facility])   . Rectal bleeding   . Sleep apnea    CPAP  . Vertigo     Past Surgical History:  Procedure Laterality Date  . ABDOMINAL HYSTERECTOMY    . APPENDECTOMY    . COLONOSCOPY WITH PROPOFOL N/A 01/16/2016   Procedure: COLONOSCOPY WITH PROPOFOL;  Surgeon: Lucilla Lame, MD;  Location: [facility];  Service: Endoscopy;  Laterality: N/A;  CPAP  . KNEE SURGERY     2013 and 2014  . POLYPECTOMY  01/16/2016   Procedure: POLYPECTOMY;  Surgeon: Lucilla Lame, MD;  Location: [facility];  Service: Endoscopy;;  . REPLACEMENT TOTAL KNEE BILATERAL Bilateral    2013, 2014  . TONSILLECTOMY     ADENOIDECTOMY    Prior to Admission medications   Medication Sig Start Date End Date Taking?  Authorizing Provider  albuterol (PROVENTIL HFA;VENTOLIN HFA) 108 (90 Base) MCG/ACT inhaler Inhale 1-2 puffs into the lungs every 6 (six) hours as needed for wheezing or shortness of breath. 01/05/16  Yes Juline Patch, MD  Calcium Carbonate-Vit D-Min (CALCIUM 1200 PO) Take by mouth.   Yes [provider]  folic acid (FOLVITE) 1 MG tablet Take 1 mg by mouth daily.  09/16/16  Yes [provider]  gabapentin (NEURONTIN) 100 MG capsule Take 100 mg by mouth at bedtime. Dr Sharol Roussel 10/12/16  Yes [provider]  levocetirizine (XYZAL) 5 MG tablet Take 1 tablet (5 mg total) by mouth as needed. 11/11/16  Yes Juline Patch, MD  methotrexate 2.5 MG tablet Take 10 mg by mouth once a  week. Takes on Tuesdays 09/20/16  Yes [provider]  Multiple Vitamin (MULTIVITAMIN) capsule Take 1 capsule by mouth daily.   Yes [provider]  nystatin (MYCOSTATIN) 100000 UNIT/ML suspension Take 5 mLs (500,000 Units total) by mouth 4 (four) times daily. 05/04/17  Yes Juline Patch, MD  omeprazole (PRILOSEC) 20 MG capsule Take 1 capsule (20 mg total) by mouth daily. 02/02/17  Yes Juline Patch, MD  ondansetron (ZOFRAN ODT) 4 MG disintegrating tablet Take 1 tablet (4 mg total) by mouth every 8 (eight) hours as needed for nausea or vomiting. 05/09/17  Yes [place], [place], MD  polyethylene glycol [facility] / GLYCOLAX) packet Take 17 g by mouth every other day.    Yes [provider]  potassium chloride SA (K-DUR,KLOR-CON) 20 MEQ tablet Take 1 tablet (20 mEq total) by mouth 2 (two) times daily. 05/11/17  Yes Juline Patch, MD  predniSONE (DELTASONE) 5 MG tablet Take 10 mg by mouth.  11/23/16  Yes [provider]  AMBULATORY NON FORMULARY MEDICATION 1 Units by Other route daily. Right AFO 03/08/17   Narda Amber K, DO  amoxicillin-clavulanate (AUGMENTIN) 875-125 MG tablet Take 1 tablet by mouth 2 (two) times daily. Patient not taking: Reported on 05/15/2017 04/24/17   Norval Gable, MD  metroNIDAZOLE (FLAGYL) 500 MG tablet Take 1 tablet (500 mg total) by mouth 3 (three) times daily. Patient not taking: Reported on 05/15/2017 05/11/17   Juline Patch, MD  neomycin-polymyxin-hydrocortisone (CORTISPORIN) 3.5-10000-1 OTIC suspension Place 4 drops into the right ear 3 (three) times daily. Patient not taking: Reported on 05/15/2017 04/24/17   Norval Gable, MD    Family History  Problem Relation Age of Onset  . Osteoporosis Mother   . Chronic Renal Failure Father   . Arthritis/Rheumatoid Sister   . Stroke Sister   . Fibromyalgia Sister   . Breast cancer Sister 50  . Diabetes Brother      Social History   Tobacco Use  . Smoking status: Former Smoker     Years: 3.00    Types: Cigarettes    Last attempt to quit: 1969    Years since quitting: 50.1  . Smokeless tobacco: Never Used  Substance Use Topics  . Alcohol use: No    Comment: 4-5 times a year  . Drug use: No    Allergies as of 05/15/2017 - Review Complete 05/15/2017  Allergen Reaction Noted  . Entex lq [phenylephrine-guaifenesin] Swelling 08/18/2015  . Codeine Hives and Other (See Comments) 08/18/2015  . Sulfa antibiotics Itching 08/18/2015  . Tape Other (See Comments) 01/08/2016    Review of Systems:    All systems reviewed and negative except where noted in HPI.   Physical Exam:  Vital signs in  last 24 hours: Temp:  [97.9 F (36.6 C)-98.4 F (36.9 C)] 98.1 F (36.7 C) (02/11 0616) Pulse Rate:  [94-101] 94 (02/11 0616) Resp:  [15-21] 18 (02/11 0616) BP: (111-143)/(47-72) 126/59 (02/11 0616) SpO2:  [99 %-100 %] 99 % (02/11 0616) Weight:  [190 lb (86.2 kg)] 190 lb (86.2 kg) (02/10 1452) Last BM Date: 05/16/17 General:   Pleasant, cooperative in NAD Head:  Normocephalic and atraumatic. Eyes:   No icterus.   Conjunctiva pink. PERRLA. Ears:  Normal auditory acuity. Neck:  Supple; no masses or thyroidomegaly Lungs: Respirations even and unlabored. Lungs clear to auscultation bilaterally.   No wheezes, crackles, or rhonchi.  Heart:  Regular rate and rhythm;  Without murmur, clicks, rubs or gallops Abdomen:  Soft, nondistended, diffusely tender. Normal bowel sounds. No appreciable masses or hepatomegaly.  No rebound or guarding.  Rectal:  Not performed. Msk:  Symmetrical without gross deformities.  Strength 5/5 upper and lower extremity bilaterally Extremities:  Without edema, cyanosis or clubbing. Neurologic:  Alert and oriented x3;  grossly normal neurologically. Skin:  Intact without significant lesions or rashes. Cervical Nodes:  No significant cervical adenopathy. Psych:  Alert and cooperative. Normal affect.  LAB RESULTS: Recent Labs    05/15/17 1547  05/16/17 0415  WBC 10.4 7.6  HGB 14.7 12.7  HCT 43.9 36.9  PLT 189 158   BMET Recent Labs    05/15/17 1758 05/16/17 0415  NA 139 138  K 3.2* 2.1*  CL 106 106  CO2 18* 18*  GLUCOSE 88 82  BUN <5* <5*  CREATININE 0.56 0.48  CALCIUM 8.3* 7.9*   LFT Recent Labs    05/15/17 1758  PROT 4.9*  ALBUMIN 2.5*  AST 18  ALT 9*  ALKPHOS 48  BILITOT 1.4*   PT/INR No results for input(s): LABPROT, INR in the last 72 hours.  STUDIES: Ct Abdomen Pelvis W Contrast  Result Date: 05/15/2017 CLINICAL DATA:  Pt arrives via [facility] from home with complaints of bloody stool, increased weakness and lower abdominal pain that increases during bowel movements. Pt has been unable to eat or drink for several days. Pt was here this week and dx with C. Diff. Pt has been taking medications daily for four days, including today. Per pt, stools have become more "normal looking" but began as bright red "balls" this morning. Pt has lower abdominal pain that increases while trying to have bowel movement. EXAM: CT ABDOMEN AND PELVIS WITH CONTRAST TECHNIQUE: Multidetector CT imaging of the abdomen and pelvis was performed using the standard protocol following bolus administration of intravenous contrast. CONTRAST:  173m ISOVUE-300 IOPAMIDOL (ISOVUE-300) INJECTION 61% COMPARISON:  05/09/2017 FINDINGS: Lower chest: No acute abnormality. Hepatobiliary: No focal liver abnormality is seen. No gallstones, gallbladder wall thickening, or biliary dilatation. Pancreas: Unremarkable. No pancreatic ductal dilatation or surrounding inflammatory changes. Spleen: Normal in size without focal abnormality. Adrenals/Urinary Tract: No adrenal masses. Tiny low-density renal lesions are noted, likely cysts. There are small nonobstructing stones in the lower pole the left kidney. No hydronephrosis. Ureters are normal in course and in caliber. Bladder is unremarkable. Stomach/Bowel: Mild wall thickening is developed of the sigmoid colon. The  descending colon is decompressed. There are numerous diverticula along the descending and sigmoid colon. There is subtle haziness in the fat adjacent to the proximal to mid sigmoid colon. The mild wall thickening of the sigmoid colon and this subtle hazy opacity in the sigmoid pericolonic fat are new findings since the prior CT. Remainder of the colon  is unremarkable. Stomach shows a moderate size hiatal hernia but is otherwise unremarkable. Small bowel is within normal limits. Vascular/Lymphatic: Minor aortic atherosclerosis. There are prominent mesenteric lymph nodes adjacent to the sigmoid colon, none pathologically enlarged. There are also scattered subcentimeter retroperitoneal lymph nodes, also none pathologically enlarged. Reproductive: Status post hysterectomy. No adnexal masses. Other: Trace pelvic free fluid which was present on the prior study. No abdominal wall hernia. Musculoskeletal: No fracture or acute finding. No osteoblastic or osteolytic lesions. IMPRESSION: 1. Since the prior study, mild wall thickening has developed along the sigmoid colon with subtle hazy inflammatory type change in the pericolonic fat. This extends throughout the sigmoid colon, which is longer in extent than would be expected for diverticulitis. Inflammatory or infectious colitis as opposed to diverticulitis is suspected. 2. No other evidence of an acute abnormality within the abdomen or pelvis. 3. Moderate hiatal hernia. 4. Stable nonobstructing stones in the lower pole the left kidney. 5. Minor aortic atherosclerosis. Electronically Signed   By: Lajean Manes M.D.   On: 05/15/2017 18:10      Impression / Plan:   RYLIEGH MCDUFFEY is a 72 y.o. y/o female with presents due to diarrhea, hematochezia, and recently diagnosed with C. difficile on May 11, 2017 and was started on Flagyl with CT showing mild wall thickening in the sigmoid colon  As reported in the CT scan the sigmoid thickening "extends throughout the sigmoid  colon, which is longer Than would be expected for diverticulitis" Patient's findings and clinical symptoms are likely due to underlying C. Difficile The thickening seen on her CT scan is likely due to C. difficile However, hematochezia might be due to C. difficile induced inflammation, or due to ischemic colitis that might have developed due to her ongoing diarrhea, dehydration and possible hypotension at home Her normal hemoglobin is reassuring In the presence of active symptoms, and active inflammation due to C. difficile, would not recommend colonoscopy at this time as benefits would outweigh risks, including risks of perforation.  In addition patient has hypokalemia which would need to be corrected prior to any endoscopic procedures Would recommend continued management with IV fluids Replace electrolytes as appropriate She has been started on vancomycin 125 mg orally 4 times a day.  This is appropriate dosing according to the new 2018 ID guidelines for C. Difficile. Clinical symptoms will need to be monitored closely If patient develops worsening abdominal pain, or acute abdomen, would recommend emergent surgery consult at that time, for evaluation of possible toxic megacolon.  The symptoms are not present at this time. No signs of ileus present at this time clinically or on CT scan.  If ileus develops, would recommend adding IV Flagyl to patient's vancomycin. If diarrhea worsens, or abdominal pain or time as well.  Patient is on chronic steroids, so would have low threshold for stepping up therapy, with addition of IV Flagyl if symptoms do not improve in 1-2 days, or patient develops fever, or any signs of concern. In addition, would avoid hypotension Encourage p.o. nutrition, IV fluids as needed     Thank you for involving me in the care of this patient.      LOS: 1 day   Virgel Manifold, MD  05/16/2017, 9:52 AM

## 2017-05-16 NOTE — Progress Notes (Signed)
Pt potassium 2.1 this AM. MD aware

## 2017-05-17 DIAGNOSIS — E43 Unspecified severe protein-calorie malnutrition: Secondary | ICD-10-CM

## 2017-05-17 LAB — CBC
HCT: 37.1 % (ref 35.0–47.0)
HEMOGLOBIN: 12.9 g/dL (ref 12.0–16.0)
MCH: 32 pg (ref 26.0–34.0)
MCHC: 34.7 g/dL (ref 32.0–36.0)
MCV: 92.2 fL (ref 80.0–100.0)
PLATELETS: 139 10*3/uL — AB (ref 150–440)
RBC: 4.02 MIL/uL (ref 3.80–5.20)
RDW: 15.2 % — ABNORMAL HIGH (ref 11.5–14.5)
WBC: 6.5 10*3/uL (ref 3.6–11.0)

## 2017-05-17 LAB — BASIC METABOLIC PANEL
ANION GAP: 9 (ref 5–15)
BUN: <5 mg/dL — ABNORMAL LOW (ref 6–20)
CHLORIDE: 108 mmol/L (ref 101–111)
CO2: 21 mmol/L — AB (ref 22–32)
CREATININE: 0.49 mg/dL (ref 0.44–1.00)
Calcium: 7.2 mg/dL — ABNORMAL LOW (ref 8.9–10.3)
GFR calc non Af Amer: >60 mL/min (ref >60)
Glucose, Bld: 82 mg/dL (ref 65–99)
Potassium: 2.5 mmol/L — CL (ref 3.5–5.1)
SODIUM: 138 mmol/L (ref 135–145)

## 2017-05-17 LAB — PHOSPHORUS: PHOSPHORUS: 1.4 mg/dL — AB (ref 2.5–4.6)

## 2017-05-17 LAB — MAGNESIUM: Magnesium: 1.8 mg/dL (ref 1.7–2.4)

## 2017-05-17 MED ORDER — ADULT MULTIVITAMIN W/MINERALS CH
1.0000 | ORAL_TABLET | Freq: Every day | ORAL | Status: DC
  Administered 2017-05-17 – 2017-05-24 (×8): 1 via ORAL
  Filled 2017-05-17 (×8): qty 1

## 2017-05-17 MED ORDER — MAGNESIUM OXIDE 400 (241.3 MG) MG PO TABS
400.0000 mg | ORAL_TABLET | ORAL | Status: DC | PRN
  Administered 2017-05-17: 400 mg via ORAL
  Filled 2017-05-17: qty 1

## 2017-05-17 MED ORDER — POTASSIUM CHLORIDE CRYS ER 20 MEQ PO TBCR
40.0000 meq | EXTENDED_RELEASE_TABLET | ORAL | Status: AC
  Administered 2017-05-17 (×2): 40 meq via ORAL
  Filled 2017-05-17 (×2): qty 2

## 2017-05-17 MED ORDER — POTASSIUM PHOSPHATES 15 MMOLE/5ML IV SOLN
20.0000 meq | Freq: Once | INTRAVENOUS | Status: AC
  Administered 2017-05-17: 20 meq via INTRAVENOUS
  Filled 2017-05-17: qty 4.55

## 2017-05-17 NOTE — Progress Notes (Signed)
Called Dr. Marcille Blanco regarding patient's potassium level of 2.1.  Appropriate orders were placed.  Christene Slates  05/17/2017  6:34 AM

## 2017-05-17 NOTE — Progress Notes (Signed)
[facility] [place] NAME: Lindsay Stephenson    MR#:  [id number]  DATE OF BIRTH:  July 27, 1945  SUBJECTIVE:  CHIEF COMPLAINT:   Chief Complaint  Patient presents with  . Rectal Bleeding  . Abdominal Pain  Received some antibiotic for her ear infection 2-3 weeks ago, since then have diarrhea, nausea, not able to eat enough and loss of strength. Came to emergency room last week and at urgent care Center found to have C. difficile so given oral Flagyl. She could not tolerate it and continued to have symptoms so came back and admitted for management of C. difficile with IV antibiotics. She also had some blood in her stool noted yesterday.   For slightly better now, able to tolerate liquids orally some. Still had 6 loose bowel movement last night.  REVIEW OF SYSTEMS:  CONSTITUTIONAL: No fever, positive for fatigue or weakness.  EYES: No blurred or double vision.  EARS, NOSE, AND THROAT: No tinnitus or ear pain.  RESPIRATORY: No cough, shortness of breath, wheezing or hemoptysis.  CARDIOVASCULAR: No chest pain, orthopnea, edema.  GASTROINTESTINAL: No nausea, vomiting, positive for diarrhea or abdominal pain.  GENITOURINARY: No dysuria, hematuria.  ENDOCRINE: No polyuria, nocturia,  HEMATOLOGY: No anemia, easy bruising or bleeding SKIN: No rash or lesion. MUSCULOSKELETAL: No joint pain or arthritis.   NEUROLOGIC: No tingling, numbness, weakness.  PSYCHIATRY: No anxiety or depression.   ROS  DRUG ALLERGIES:   Allergies  Allergen Reactions  . Entex Lq [Phenylephrine-Guaifenesin] Swelling    TONGUE  . Codeine Hives and Other (See Comments)    Pt reports only having symptoms after receiving large amounts of medication  . Sulfa Antibiotics Itching  . Tape Other (See Comments)    CAN PULL OFF LAYER OF SKIN    VITALS:  Blood pressure 129/76, pulse 89, temperature (!) 97.5 F (36.4 C), temperature source Oral, resp. rate 20, height 5' (1.524 m), weight  86.2 kg (190 lb), SpO2 99 %.  PHYSICAL EXAMINATION:  GENERAL:  72 y.o.-year-old patient lying in the bed with no acute distress.  EYES: Pupils equal, round, reactive to light and accommodation. No scleral icterus. Extraocular muscles intact.  HEENT: Head atraumatic, normocephalic. Oropharynx and nasopharynx clear.  NECK:  Supple, no jugular venous distention. No thyroid enlargement, no tenderness.  LUNGS: Normal breath sounds bilaterally, no wheezing, rales,rhonchi or crepitation. No use of accessory muscles of respiration.  CARDIOVASCULAR: S1, S2 normal. No murmurs, rubs, or gallops.  ABDOMEN: Soft, tender, nondistended. Bowel sounds present. No organomegaly or mass.  EXTREMITIES: No pedal edema, cyanosis, or clubbing.  NEUROLOGIC: Cranial nerves II through XII are intact. Muscle strength 4/5 in all extremities. Sensation intact. Gait not checked.  PSYCHIATRIC: The patient is alert and oriented x 3.  SKIN: No obvious rash, lesion, or ulcer.   Physical Exam LABORATORY PANEL:   CBC Recent Labs  Lab 05/17/17 0538  WBC 6.5  HGB 12.9  HCT 37.1  PLT 139*   ------------------------------------------------------------------------------------------------------------------  Chemistries  Recent Labs  Lab 05/15/17 1758  05/17/17 0538  NA 139   < > 138  K 3.2*   < > 2.5*  CL 106   < > 108  CO2 18*   < > 21*  GLUCOSE 88   < > 82  BUN <5*   < > <5*  CREATININE 0.56   < > 0.49  CALCIUM 8.3*   < > 7.2*  MG 1.6*  --  1.8  AST  18  --   --   ALT 9*  --   --   ALKPHOS 48  --   --   BILITOT 1.4*  --   --    < > = values in this interval not displayed.   ------------------------------------------------------------------------------------------------------------------  Cardiac Enzymes No results for input(s): TROPONINI in the last 168 hours. ------------------------------------------------------------------------------------------------------------------  RADIOLOGY:  Ct Abdomen  Pelvis W Contrast  Result Date: 05/15/2017 CLINICAL DATA:  Pt arrives via [facility] from home with complaints of bloody stool, increased weakness and lower abdominal pain that increases during bowel movements. Pt has been unable to eat or drink for several days. Pt was here this week and dx with C. Diff. Pt has been taking medications daily for four days, including today. Per pt, stools have become more "normal looking" but began as bright red "balls" this morning. Pt has lower abdominal pain that increases while trying to have bowel movement. EXAM: CT ABDOMEN AND PELVIS WITH CONTRAST TECHNIQUE: Multidetector CT imaging of the abdomen and pelvis was performed using the standard protocol following bolus administration of intravenous contrast. CONTRAST:  18m ISOVUE-300 IOPAMIDOL (ISOVUE-300) INJECTION 61% COMPARISON:  05/09/2017 FINDINGS: Lower chest: No acute abnormality. Hepatobiliary: No focal liver abnormality is seen. No gallstones, gallbladder wall thickening, or biliary dilatation. Pancreas: Unremarkable. No pancreatic ductal dilatation or surrounding inflammatory changes. Spleen: Normal in size without focal abnormality. Adrenals/Urinary Tract: No adrenal masses. Tiny low-density renal lesions are noted, likely cysts. There are small nonobstructing stones in the lower pole the left kidney. No hydronephrosis. Ureters are normal in course and in caliber. Bladder is unremarkable. Stomach/Bowel: Mild wall thickening is developed of the sigmoid colon. The descending colon is decompressed. There are numerous diverticula along the descending and sigmoid colon. There is subtle haziness in the fat adjacent to the proximal to mid sigmoid colon. The mild wall thickening of the sigmoid colon and this subtle hazy opacity in the sigmoid pericolonic fat are new findings since the prior CT. Remainder of the colon is unremarkable. Stomach shows a moderate size hiatal hernia but is otherwise unremarkable. Small bowel is within  normal limits. Vascular/Lymphatic: Minor aortic atherosclerosis. There are prominent mesenteric lymph nodes adjacent to the sigmoid colon, none pathologically enlarged. There are also scattered subcentimeter retroperitoneal lymph nodes, also none pathologically enlarged. Reproductive: Status post hysterectomy. No adnexal masses. Other: Trace pelvic free fluid which was present on the prior study. No abdominal wall hernia. Musculoskeletal: No fracture or acute finding. No osteoblastic or osteolytic lesions. IMPRESSION: 1. Since the prior study, mild wall thickening has developed along the sigmoid colon with subtle hazy inflammatory type change in the pericolonic fat. This extends throughout the sigmoid colon, which is longer in extent than would be expected for diverticulitis. Inflammatory or infectious colitis as opposed to diverticulitis is suspected. 2. No other evidence of an acute abnormality within the abdomen or pelvis. 3. Moderate hiatal hernia. 4. Stable nonobstructing stones in the lower pole the left kidney. 5. Minor aortic atherosclerosis. Electronically Signed   By: DLajean ManesM.D.   On: 05/15/2017 18:10    ASSESSMENT AND PLAN:   Active Problems:   Clostridium difficile colitis  * C. difficile colitis Failed outpatient treatment.   continue vancomycin p.o.  Zofran as needed.  IV fluids support.  Appreciated consult by GI, added IV Flagyl.   Will call surgical consult if any worsening in the condition then we have to suspect toxic megacolon.   Still had 60 was bowel movement last night,  continue monitoring.  * Rectal bleeding.  Follow hemoglobin and GI consult. Hemoglobin stable, likely due to C. difficile colitis.   GI suggested to monitor for now.    * Hypokalemia.  Give potassium supplement and follow-up potassium and magnesium level.   Admission is low, replace IV. Patient cannot take oral potassium tablets, will replace IV.   Today explained patient to try to take oral  tablet, she agreed.   * Hypertension, controlled.   Monitor with IV fluid.   Stable.  * Hypophosphatemia   Replace by pharmacist as needed.    All the records are reviewed and case discussed with Care Management/Social Workerr. Management plans discussed with the patient, family and they are in agreement.  CODE STATUS: Full.  TOTAL TIME TAKING CARE OF THIS PATIENT: 35 minutes.    we may get physical therapy once patient's electrolytes are stable.   discussed with her husband in the room today.  POSSIBLE D/C IN 1-2 DAYS, DEPENDING ON CLINICAL CONDITION.   Vaughan Basta M.D on 05/17/2017   Between 7am to 6pm - Pager - [phone]  After 6pm go to [email or web address] - password [email or web address] [facility]  Office  [phone]  CC: Primary care physician; Juline Patch, MD  Note: This dictation was prepared with Dragon dictation along with smaller phrase technology. Any transcriptional errors that result from this process are unintentional.

## 2017-05-17 NOTE — Progress Notes (Signed)
Initial Nutrition Assessment  DOCUMENTATION CODES:   Severe malnutrition in context of acute illness/injury  INTERVENTION:   Recommend monitoring K+, Mg, Phos as pt is at high risk for refeeding syndrome.   RD to order Magic cup TID (orange) with meals, each supplement provides 290kcals and 9g of protein.  RD to order carnation instant breakfast with milk daily; each supplement provides 130kcal and 5g protein.   RD to order MVI.  NUTRITION DIAGNOSIS:   Severe Malnutrition related to acute illness as evidenced by energy intake < or equal to 50% for > or equal to 5 days, percent weight loss (9% over 2 months).  GOAL:   Patient will meet greater than or equal to 90% of their needs  MONITOR:   PO intake, Supplement acceptance, Weight trends, Diet advancement, Labs, I & O's  REASON FOR ASSESSMENT:   Malnutrition Screening Tool    ASSESSMENT:   72y.o. Female with PMH of IBD, HTN, HLD, Parkinson's disease, neuromuscular disorder, LABD, GERD, vitamin B12 & D deficiency, and anemia. Pt recently admitted with diarrhea/nausea following antibiotic and fluconazole treatment (Augmentin) for right-sided ear infection and subsequent thrush. Pt was being treated by PCP for C. Difficile starting 05/11/17. Pt admitted with persistent diarrhea, bright red blood per rectum, and C. Diff. Concern for potential development of ileus/toxic mega colon.   Pt reports some improvement in diarrhea today.  At time of visit, pt was drinking ginger ale (for heart burn) and did not consume any breakfast. Pt reports she is afraid to eat because she does not want to vomit but states she will attempt to order yogurt and a "bland" broth for lunch. Pt also reports heartburn relief with administration of Protonix this morning.   Pt reports lack of appetite and only consuming "4 bites of food" daily for the past month. She also states early satiety with any intake, nausea without vomiting, and persistent diarrhea for  the past month. Pt takes MVI at home regularly and was eating well/balanced prior to this past month of recurrent illnesses.  During this past month, pt reports having thrush that limited intake due to pain and mouth sores for ~2weeks. Pt also developed metallic taste changes since taking Aumentin but this has started to improve.  Intern encouraged pt to try yogurt today for the probiotic content. Pt also amenable to trying Magic Cup and Carnation instant breakfast supplements in preference to Ensure/Boost. Intern also suggested pt could mix carnation into yogurt.   Pt expressed interest in outpatient dietary education for her autoimmune conditions; intern suggested pt ask MD for referral. Pt also inquired about potassium containing foods for her low potassium lab value. Intern explained how prolonged diarrhea can disrupt electrolyte balance and that she was likely getting adequate potassium from her diet and home MVI prior to the onset of her current illness.   Pt reports losing 20lb over the past month. Per chart review pt has lost 19lb (9%) over the past two months, which is significant for time frame. Intern requested scaled wt with nursing student at time of next visit.   Medications: Folic acid, prednisone, vancomycin, flagyl, maalox, Magnesium oxide. 0.9% NaCl with KCl IV fluids, IV Potassium phosphate.  Labs: K+ (L; 2.5), BUN (L; <5), Phos (L; 1.4), Mg (1.8; WNL), Calcium (L; 8.3)  NUTRITION - FOCUSED PHYSICAL EXAM:  NFPE shows no muscle or fat depletions at this time. However, depletions could potentially be masked given pt's BMI status. Mild pitting edema noted on bilateral ankles.  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   Diet Order:  Diet full liquid Room service appropriate? Yes; Fluid consistency: Thin  EDUCATION NEEDS:   Education needs have been addressed  Skin:  Skin  Assessment: Reviewed RN Assessment  Last BM:  2/12 type 7  Height:   Ht Readings from Last 1 Encounters:  05/15/17 5' (1.524 m)    Weight:  Wt Readings from Last 3 Encounters:  05/15/17 190 lb (86.2 kg)  05/09/17 190 lb (86.2 kg)  05/04/17 195 lb (88.5 kg)  03/07/17 209lb 4oz   Ideal Body Weight:  45.45 kg  BMI:  Body mass index is 37.11 kg/m.  Estimated Nutritional Needs:   Kcal:  1500-1700  Protein:  85-95g  Fluid:  1.5-1.7L  Denece Shearer, MS, Dietetic Intern Pager # [phone]

## 2017-05-17 NOTE — Progress Notes (Signed)
PT Cancellation Note  Patient Details Name: Lindsay Stephenson MRN: [id number] DOB: 01-10-1946   Cancelled Treatment:    Reason Eval/Treat Not Completed: Patient not medically ready. Per chart review, pt with 2.5 K+ level. Not appropriate for PT intervention at this time. Will hold.   Nataly Pacifico 05/17/2017, 8:55 AM  Greggory Stallion, PT, DPT [phone]

## 2017-05-18 LAB — PHOSPHORUS: Phosphorus: 1.5 mg/dL — ABNORMAL LOW (ref 2.5–4.6)

## 2017-05-18 LAB — BASIC METABOLIC PANEL
ANION GAP: 10 (ref 5–15)
CALCIUM: 7.4 mg/dL — AB (ref 8.9–10.3)
CO2: 22 mmol/L (ref 22–32)
Chloride: 108 mmol/L (ref 101–111)
Creatinine, Ser: 0.5 mg/dL (ref 0.44–1.00)
GFR calc Af Amer: >60 mL/min (ref >60)
GLUCOSE: 88 mg/dL (ref 65–99)
Potassium: 3 mmol/L — ABNORMAL LOW (ref 3.5–5.1)
SODIUM: 140 mmol/L (ref 135–145)

## 2017-05-18 LAB — MAGNESIUM: Magnesium: 1.6 mg/dL — ABNORMAL LOW (ref 1.7–2.4)

## 2017-05-18 MED ORDER — MAGNESIUM SULFATE 2 GM/50ML IV SOLN
2.0000 g | Freq: Once | INTRAVENOUS | Status: AC
  Administered 2017-05-18: 2 g via INTRAVENOUS
  Filled 2017-05-18: qty 50

## 2017-05-18 MED ORDER — POTASSIUM PHOSPHATES 15 MMOLE/5ML IV SOLN
15.0000 mmol | Freq: Once | INTRAVENOUS | Status: AC
  Administered 2017-05-18: 15 mmol via INTRAVENOUS
  Filled 2017-05-18: qty 5

## 2017-05-18 NOTE — Evaluation (Addendum)
Physical Therapy Evaluation Patient Details Name: Lindsay Stephenson MRN: [id number] DOB: 03-Oct-1945 Today's Date: 05/18/2017   History of Present Illness  Pt admitted for Cdiff. Pt complains of rectal bleeding and abdominal pain and complaints of diarrhea x 7-10 days. History includes GERD, HTN, and mitral valve prolapse. K+ improved this date.  Clinical Impression  Pt is a pleasant 72 year old female who was admitted for Cdiff. Pt performs bed mobility with min assist, transfers with cga, and ambulation with cga and rollater. Per family, has supportive family. Pt demonstrates deficits with weakness/endurance/mobility. Pt has limited R LE strength at baseline, usually wears AFO due to foot drop. Not in room at this time.Pt limited this date due to incontinent episode, however I think could of ambulated atleast 20-30' using rollater. Pt motivated and has been participating in OP PT. Is currently not at baseline level, however with supports in place, agree that home discharge is appropriate. Will continue to see during acute hospitalization to progress as able. Would benefit from skilled PT to address above deficits and promote optimal return to PLOF. Recommend transition to [facility] upon discharge from acute hospitalization.       Follow Up Recommendations Home health PT;Supervision/Assistance - 24 hour    Equipment Recommendations  None recommended by PT    Recommendations for Other Services       Precautions / Restrictions Precautions Precautions: Fall Restrictions Weight Bearing Restrictions: No      Mobility  Bed Mobility Overal bed mobility: Needs Assistance Bed Mobility: Supine to Sit     Supine to sit: Min assist     General bed mobility comments: safe technique with guidance for R LE given. Once seated at EOB, dizziness noted, however resolves quickly. While seated at EOB, pt able to brush teeth.  Transfers Overall transfer level: Needs assistance Equipment used: 4-wheeled  walker Transfers: Sit to/from Stand Sit to Stand: Min guard         General transfer comment: safe technique with pushing from seated surface  Ambulation/Gait Ambulation/Gait assistance: Min guard Ambulation Distance (Feet): 5 Feet Assistive device: 4-wheeled walker Gait Pattern/deviations: Step-to pattern     General Gait Details: ambulated with safe technique, decreased stance time on R LE. Pt fatigues quickly and has incontient episode, limiting further mobility efforts. RN notified.   Stairs            Wheelchair Mobility    Modified Rankin (Stroke Patients Only)       Balance Overall balance assessment: Needs assistance Sitting-balance support: Feet supported Sitting balance-Leahy Scale: Good     Standing balance support: Bilateral upper extremity supported Standing balance-Leahy Scale: Fair                               Pertinent Vitals/Pain Pain Assessment: No/denies pain    Home Living Family/patient expects to be discharged to:: Private residence Living Arrangements: Spouse/significant other Available Help at Discharge: Family Type of Home: House Home Access: Stairs to enter   Technical brewer of Steps: 1 small step Home Layout: One level Home Equipment: Walker - 4 wheels      Prior Function Level of Independence: Independent with assistive device(s)         Comments: very active, uses rollater for all mobility. Has been going to therapy 3x/week. Reports she cooks, cleans.     Hand Dominance        Extremity/Trunk Assessment   Upper Extremity Assessment  Upper Extremity Assessment: Generalized weakness(B UE grossly 3+/5)    Lower Extremity Assessment Lower Extremity Assessment: Generalized weakness(L LE grossly 4/5; R LE grossly 3/5)       Communication   Communication: No difficulties  Cognition Arousal/Alertness: Awake/alert Behavior During Therapy: WFL for tasks assessed/performed Overall Cognitive  Status: Within Functional Limits for tasks assessed                                        General Comments      Exercises Other Exercises Other Exercises: Seated ther-ex performed including LAQ, alt. marching, chair sit ups, and ankle pumps. All ther-ex performed x 10 reps with supervision.   Assessment/Plan    PT Assessment Patient needs continued PT services  PT Problem List Decreased strength;Decreased activity tolerance;Decreased balance;Decreased mobility;Obesity       PT Treatment Interventions Gait training;Therapeutic exercise;Functional mobility training    PT Goals (Current goals can be found in the Care Plan section)  Acute Rehab PT Goals Patient Stated Goal: to go home tomorrow PT Goal Formulation: With patient Time For Goal Achievement: 06/01/17 Potential to Achieve Goals: Good    Frequency Min 2X/week   Barriers to discharge        Co-evaluation               AM-PAC PT "6 Clicks" Daily Activity  Outcome Measure Difficulty turning over in bed (including adjusting bedclothes, sheets and blankets)?: Unable Difficulty moving from lying on back to sitting on the side of the bed? : Unable Difficulty sitting down on and standing up from a chair with arms (e.g., wheelchair, bedside commode, etc,.)?: Unable Help needed moving to and from a bed to chair (including a wheelchair)?: A Little Help needed walking in hospital room?: A Little Help needed climbing 3-5 steps with a railing? : A Lot 6 Click Score: 11    End of Session Equipment Utilized During Treatment: Gait belt Activity Tolerance: Patient tolerated treatment well Patient left: in chair;with chair alarm set Nurse Communication: Mobility status PT Visit Diagnosis: Muscle weakness (generalized) (M62.81);Difficulty in walking, not elsewhere classified (R26.2)    Time: 0722-5750 PT Time Calculation (min) (ACUTE ONLY): 38 min   Charges:   PT Evaluation $PT Eval Low Complexity: 1  Low PT Treatments $Therapeutic Exercise: 8-22 mins   PT G Codes:        Greggory Stallion, PT, DPT [phone]   Tyjah Hai 05/18/2017, 2:32 PM

## 2017-05-18 NOTE — Progress Notes (Signed)
MEDICATION RELATED CONSULT NOTE - INITIAL   Pharmacy Consult for electrolyte management  Indication: hypophosphatemia, hypokalemia, and hypomagnesia   Allergies  Allergen Reactions  . Entex Lq [Phenylephrine-Guaifenesin] Swelling    TONGUE  . Codeine Hives and Other (See Comments)    Pt reports only having symptoms after receiving large amounts of medication  . Sulfa Antibiotics Itching  . Tape Other (See Comments)    CAN PULL OFF LAYER OF SKIN    Patient Measurements: Height: 5' (152.4 cm) Weight: 190 lb (86.2 kg) IBW/kg (Calculated) : 45.5 Adjusted Body Weight:   Vital Signs: Temp: 98.6 F (37 C) (02/13 0618) BP: 129/58 (02/13 0618) Pulse Rate: 93 (02/13 0618) Intake/Output from previous day: 02/12 0701 - 02/13 0700 In: 2609 [P.O.:50; I.V.:2133; IV Piggyback:426] Out: -  Intake/Output from this shift: Total I/O In: 453 [I.V.:410; IV Piggyback:43] Out: 0   Labs: Recent Labs    05/15/17 1547  05/15/17 1758 05/16/17 0415 05/17/17 0538 05/18/17 0550  WBC 10.4  --   --  7.6 6.5  --   HGB 14.7  --   --  12.7 12.9  --   HCT 43.9  --   --  36.9 37.1  --   PLT 189  --   --  158 139*  --   CREATININE  --    < > 0.56 0.48 0.49 0.50  MG  --   --  1.6*  --  1.8 1.6*  PHOS  --   --   --   --  1.4* 1.5*  ALBUMIN  --   --  2.5*  --   --   --   PROT  --   --  4.9*  --   --   --   AST  --   --  18  --   --   --   ALT  --   --  9*  --   --   --   ALKPHOS  --   --  48  --   --   --   BILITOT  --   --  1.4*  --   --   --    < > = values in this interval not displayed.   Estimated Creatinine Clearance: 63.8 mL/min (by C-G formula based on SCr of 0.5 mg/dL).   Microbiology: Recent Results (from the past 720 hour(s))  Urine Culture     Status: Abnormal   Collection Time: 05/09/17  1:21 PM  Result Value Ref Range Status   Specimen Description   Final    URINE, RANDOM Performed at [facility], [address] [place], [place] [address]    Special Requests    Final    NONE Performed at [facility], [address] [place], [place] [address]    Culture MULTIPLE SPECIES PRESENT, SUGGEST RECOLLECTION (A)  Final   Report Status 05/11/2017 FINAL  Final  C difficile quick scan w PCR reflex     Status: Abnormal   Collection Time: 05/11/17 10:30 AM  Result Value Ref Range Status   C Diff antigen POSITIVE (A) NEGATIVE Final   C Diff toxin NEGATIVE NEGATIVE Final   C Diff interpretation Results are indeterminate. See PCR results.  Final    Comment: Performed at [facility], [address] [place],  [address]  C. Diff by PCR, Reflexed     Status: Abnormal   Collection Time: 05/11/17 10:30 AM  Result Value  Ref Range Status   Toxigenic C. Difficile by PCR POSITIVE (A) NEGATIVE Final    Comment: Positive for toxigenic C. difficile with little to no toxin production. Only treat if clinical presentation suggests symptomatic illness. Performed at [facility], [address] [place], [place] [address]   Gastrointestinal Panel by PCR , Stool     Status: None   Collection Time: 05/15/17  2:41 PM  Result Value Ref Range Status   Campylobacter species NOT DETECTED NOT DETECTED Final   Plesimonas shigelloides NOT DETECTED NOT DETECTED Final   Salmonella species NOT DETECTED NOT DETECTED Final   Yersinia enterocolitica NOT DETECTED NOT DETECTED Final   Vibrio species NOT DETECTED NOT DETECTED Final   Vibrio cholerae NOT DETECTED NOT DETECTED Final   Enteroaggregative E coli (EAEC) NOT DETECTED NOT DETECTED Final   Enteropathogenic E coli (EPEC) NOT DETECTED NOT DETECTED Final   Enterotoxigenic E coli (ETEC) NOT DETECTED NOT DETECTED Final   Shiga like toxin producing E coli (STEC) NOT DETECTED NOT DETECTED Final   Shigella/Enteroinvasive E coli (EIEC) NOT DETECTED NOT DETECTED Final   Cryptosporidium NOT DETECTED NOT DETECTED Final   Cyclospora cayetanensis NOT DETECTED NOT DETECTED Final   Entamoeba histolytica  NOT DETECTED NOT DETECTED Final   Giardia lamblia NOT DETECTED NOT DETECTED Final   Adenovirus F40/41 NOT DETECTED NOT DETECTED Final   Astrovirus NOT DETECTED NOT DETECTED Final   Norovirus GI/GII NOT DETECTED NOT DETECTED Final   Rotavirus A NOT DETECTED NOT DETECTED Final   Sapovirus (I, II, IV, and V) NOT DETECTED NOT DETECTED Final    Comment: Performed at [facility], [address], [place], [place] [address]    Medical History: Past Medical History:  Diagnosis Date  . Allergy   . Anemia    TAKING IRON  . Arthritis    KNEES AND HANDS  . Asthma    COUGHING, NO ATTACKS IN A YEAR OR 2  . Dyspnea    ON EXERTION  . GERD (gastroesophageal reflux disease)   . HOH (hard of hearing)    30-35 % HEARING LOSS  . Hyperlipidemia   . Hypertension    HX OF  . LABD (linear immunoglobulin A bullous dermatosis)   . Mitral valve prolapse   . Mitral valve prolapse   . 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 Parkinson's disease ([place])   . Rectal bleeding   . Sleep apnea    CPAP  . Vertigo     Medications:  Medications Prior to Admission  Medication Sig Dispense Refill Last Dose  . albuterol (PROVENTIL HFA;VENTOLIN HFA) 108 (90 Base) MCG/ACT inhaler Inhale 1-2 puffs into the lungs every 6 (six) hours as needed for wheezing or shortness of breath. 1 Inhaler 1 PRN at PRN  . Calcium Carbonate-Vit Stephenson-Min (CALCIUM 1200 PO) Take by mouth.   12/06/5699 at AM  . folic acid (FOLVITE) 1 MG tablet Take 1 mg by mouth daily.    05/14/2017 at AM  . gabapentin (NEURONTIN) 100 MG capsule Take 100 mg by mouth at bedtime. Dr Sharol Roussel   05/14/2017 at PM  . levocetirizine (XYZAL) 5 MG tablet Take 1 tablet (5 mg total) by mouth as needed. 90 tablet 1 PRN at PRN  . methotrexate 2.5 MG tablet Take 10 mg by mouth once a week. Takes on Tuesdays   05/10/2017 at AM  . Multiple Vitamin (MULTIVITAMIN) capsule Take 1 capsule by mouth daily.   05/14/2017  at AM  . nystatin  (MYCOSTATIN) 100000 UNIT/ML suspension Take 5 mLs (500,000 Units total) by mouth 4 (four) times daily. 60 mL 0 PRN at PRN  . omeprazole (PRILOSEC) 20 MG capsule Take 1 capsule (20 mg total) by mouth daily. 90 capsule 3 05/15/2017 at AM  . ondansetron (ZOFRAN ODT) 4 MG disintegrating tablet Take 1 tablet (4 mg total) by mouth every 8 (eight) hours as needed for nausea or vomiting. 20 tablet 0 PRN at PRN  . polyethylene glycol (MIRALAX / GLYCOLAX) packet Take 17 g by mouth every other day.    PRN at PRN  . potassium chloride SA (K-DUR,KLOR-CON) 20 MEQ tablet Take 1 tablet (20 mEq total) by mouth 2 (two) times daily. 60 tablet 1 05/14/2017 at AM  . predniSONE (DELTASONE) 5 MG tablet Take 10 mg by mouth.    05/14/2017 at AM  . AMBULATORY NON FORMULARY MEDICATION 1 Units by Other route daily. Right AFO 1 Units 0 UTD at UTD  . amoxicillin-clavulanate (AUGMENTIN) 875-125 MG tablet Take 1 tablet by mouth 2 (two) times daily. (Patient not taking: Reported on 05/15/2017) 20 tablet 0 -- at --  . metroNIDAZOLE (FLAGYL) 500 MG tablet Take 1 tablet (500 mg total) by mouth 3 (three) times daily. (Patient not taking: Reported on 05/15/2017) 52 tablet 0 -- at --  . neomycin-polymyxin-hydrocortisone (CORTISPORIN) 3.5-10000-1 OTIC suspension Place 4 drops into the right ear 3 (three) times daily. (Patient not taking: Reported on 05/15/2017) 10 mL 0 -- at --   Scheduled:  . enoxaparin (LOVENOX) injection  40 mg Subcutaneous Q24H  . folic acid  1 mg Oral Daily  . gabapentin  100 mg Oral QHS  . multivitamin with minerals  1 tablet Oral Daily  . pantoprazole  40 mg Oral Daily  . predniSONE  10 mg Oral Q breakfast  . vancomycin  125 mg Oral Q6H    Assessment: Pharmacy consulted to manage electrolytes in this 72 year old woman admitted for rectal bleeding and found to have Cdiff.   Goal of Therapy:  K-3.5-5.0; Magnesium: 1.8-2.4; Phos 2.5-4.6  Plan:  Patient received Magnesium sulfate 2 g IV x 1. Will give KPhos 15 mmol  IV x 1. Will recheck electrolytes in am.   Lindsay Stephenson 05/18/2017,2:12 PM

## 2017-05-18 NOTE — Progress Notes (Signed)
Lindsay Antigua, MD [address] [place], [place], [place], [address] [address], [place], [place], [place], [address] Phone: [phone]  Fax: [phone]   Subjective: Diarrhea improved.  Frequency of diarrhea has slowed.  Able to tolerate oral diet without any nausea abdominal pain improved.  2/10 cramping, lower abdominal intermittent pain.  No further hematochezia as per nurse.   Objective: Vital signs in last 24 hours: Vitals:   05/17/17 0436 05/17/17 1138 05/17/17 1939 05/18/17 0618  BP: 116/73 129/76 119/76 (!) 129/58  Pulse: 90 89 89 93  Resp: 20  20 20   Temp: 98.6 F (37 C) (!) 97.5 F (36.4 C) 98 F (36.7 C) 98.6 F (37 C)  TempSrc: Oral Oral    SpO2: 98% 99% 100% 99%  Weight:      Height:       Weight change:   Intake/Output Summary (Last 24 hours) at 05/18/2017 1331 Last data filed at 05/18/2017 1200 Gross per 24 hour  Intake 1313 ml  Output 0 ml  Net 1313 ml     Exam: Cardiac: +S1, +S2, RRR, No edema Pulm: CTA b/l, Normal Resp Effort Abd: Soft, NT/ND, No HSM Skin: Warm, no rashes Neck: Supple, Trachea midline   Lab Results: Lab Results  Component Value Date   WBC 6.5 05/17/2017   HGB 12.9 05/17/2017   HCT 37.1 05/17/2017   MCV 92.2 05/17/2017   PLT 139 (L) 05/17/2017   Micro Results: Recent Results (from the past 240 hour(s))  Urine Culture     Status: Abnormal   Collection Time: 05/09/17  1:21 PM  Result Value Ref Range Status   Specimen Description   Final    URINE, RANDOM Performed at [facility], [address] [place], [place] [address]    Special Requests   Final    NONE Performed at [facility], [address] [place], [place] [address]    Culture MULTIPLE SPECIES PRESENT, SUGGEST RECOLLECTION (A)  Final   Report Status 05/11/2017 FINAL  Final  C difficile quick scan w PCR reflex     Status: Abnormal   Collection Time: 05/11/17 10:30 AM  Result Value Ref Range Status   C Diff antigen  POSITIVE (A) NEGATIVE Final   C Diff toxin NEGATIVE NEGATIVE Final   C Diff interpretation Results are indeterminate. See PCR results.  Final    Comment: Performed at [facility], [address] [place], [place] [address]  C. Diff by PCR, Reflexed     Status: Abnormal   Collection Time: 05/11/17 10:30 AM  Result Value Ref Range Status   Toxigenic C. Difficile by PCR POSITIVE (A) NEGATIVE Final    Comment: Positive for toxigenic C. difficile with little to no toxin production. Only treat if clinical presentation suggests symptomatic illness. Performed at [facility], [address] [place],  [address]   Gastrointestinal Panel by PCR , Stool     Status: None   Collection Time: 05/15/17  2:41 PM  Result Value Ref Range Status   Campylobacter species NOT DETECTED NOT DETECTED Final   Plesimonas shigelloides NOT DETECTED NOT DETECTED Final   Salmonella species NOT DETECTED NOT DETECTED Final   Yersinia enterocolitica NOT DETECTED NOT DETECTED Final   Vibrio species NOT DETECTED NOT DETECTED Final   Vibrio cholerae NOT DETECTED NOT DETECTED Final   Enteroaggregative E coli (EAEC) NOT DETECTED NOT DETECTED Final   Enteropathogenic E coli (EPEC) NOT DETECTED NOT DETECTED Final  Enterotoxigenic E coli (ETEC) NOT DETECTED NOT DETECTED Final   Shiga like toxin producing E coli (STEC) NOT DETECTED NOT DETECTED Final   Shigella/Enteroinvasive E coli (EIEC) NOT DETECTED NOT DETECTED Final   Cryptosporidium NOT DETECTED NOT DETECTED Final   Cyclospora cayetanensis NOT DETECTED NOT DETECTED Final   Entamoeba histolytica NOT DETECTED NOT DETECTED Final   Giardia lamblia NOT DETECTED NOT DETECTED Final   Adenovirus F40/41 NOT DETECTED NOT DETECTED Final   Astrovirus NOT DETECTED NOT DETECTED Final   Norovirus GI/GII NOT DETECTED NOT DETECTED Final   Rotavirus A NOT DETECTED NOT DETECTED Final   Sapovirus (I, II, IV, and V) NOT DETECTED NOT DETECTED Final    Comment:  Performed at [facility], [address] [place], [place] [address]   Studies/Results: No results found. Medications:  Scheduled Meds: . enoxaparin (LOVENOX) injection  40 mg Subcutaneous Q24H  . folic acid  1 mg Oral Daily  . gabapentin  100 mg Oral QHS  . multivitamin with minerals  1 tablet Oral Daily  . pantoprazole  40 mg Oral Daily  . predniSONE  10 mg Oral Q breakfast  . vancomycin  125 mg Oral Q6H   Continuous Infusions: . 0.9 % NaCl with KCl 40 mEq / L 75 mL/hr (05/17/17 2215)  . metronidazole Stopped (05/18/17 4008)   PRN Meds:.acetaminophen **OR** acetaminophen, albuterol, alum & mag hydroxide-simeth, ketorolac, magnesium oxide, ondansetron **OR** ondansetron (ZOFRAN) IV   Assessment: Active Problems:   Clostridium difficile colitis   Protein-calorie malnutrition, severe    Plan: Patient on IV Flagyl and vancomycin Continuing to clinically improved at this time Hemoglobin is normal and hematochezia has resolved Bright blood per rectum was likely due to C. difficile colitis Patient is expected to clinically improve with treatment for C. difficile Continue to advance diet as tolerated Once acute symptoms have resolved, patient can follow-up in clinic, and outpatient elective colonoscopy can be scheduled in 2-3 months. Patient was previously followed by Dr. Allen Norris If symptoms worsen, inpatient colonoscopy can be considered no to evaluate for pseudomembranes, and stool transplant.  However, given her clinical improvement at this time, unlikely to worsen at this time     LOS: 3 days   Lindsay Antigua, MD 05/18/2017, 1:31 PM

## 2017-05-18 NOTE — Progress Notes (Signed)
[facility] NAME: Lindsay Stephenson    MR#:  [id number]  DATE OF BIRTH:  Jun 13, 1945  SUBJECTIVE:  CHIEF COMPLAINT:   Chief Complaint  Patient presents with  . Rectal Bleeding  . Abdominal Pain  Received some antibiotic for her ear infection 2-3 weeks ago, since then have diarrhea, nausea, not able to eat enough and loss of strength. Came to emergency room last week and at urgent care Center found to have C. difficile so given oral Flagyl. She could not tolerate it and continued to have symptoms so came back and admitted for management of C. difficile with IV antibiotics. She also had some blood in her stool noted yesterday.   For slightly better now, able to tolerate liquids orally some. Still had 5-6 loose bowel movement last night.  REVIEW OF SYSTEMS:  CONSTITUTIONAL: No fever, positive for fatigue or weakness.  EYES: No blurred or double vision.  EARS, NOSE, AND THROAT: No tinnitus or ear pain.  RESPIRATORY: No cough, shortness of breath, wheezing or hemoptysis.  CARDIOVASCULAR: No chest pain, orthopnea, edema.  GASTROINTESTINAL: No nausea, vomiting, positive for diarrhea or abdominal pain.  GENITOURINARY: No dysuria, hematuria.  ENDOCRINE: No polyuria, nocturia,  HEMATOLOGY: No anemia, easy bruising or bleeding SKIN: No rash or lesion. MUSCULOSKELETAL: No joint pain or arthritis.   NEUROLOGIC: No tingling, numbness, weakness.  PSYCHIATRY: No anxiety or depression.   ROS  DRUG ALLERGIES:   Allergies  Allergen Reactions  . Entex Lq [Phenylephrine-Guaifenesin] Swelling    TONGUE  . Codeine Hives and Other (See Comments)    Pt reports only having symptoms after receiving large amounts of medication  . Sulfa Antibiotics Itching  . Tape Other (See Comments)    CAN PULL OFF LAYER OF SKIN    VITALS:  Blood pressure 137/75, pulse 91, temperature 98.1 F (36.7 C), temperature source Oral, resp. rate 13, height 5' (1.524 m), weight  86.2 kg (190 lb), SpO2 99 %.  PHYSICAL EXAMINATION:  GENERAL:  72 y.o.-year-old patient lying in the bed with no acute distress.  EYES: Pupils equal, round, reactive to light and accommodation. No scleral icterus. Extraocular muscles intact.  HEENT: Head atraumatic, normocephalic. Oropharynx and nasopharynx clear.  NECK:  Supple, no jugular venous distention. No thyroid enlargement, no tenderness.  LUNGS: Normal breath sounds bilaterally, no wheezing, rales,rhonchi or crepitation. No use of accessory muscles of respiration.  CARDIOVASCULAR: S1, S2 normal. No murmurs, rubs, or gallops.  ABDOMEN: Soft, tender, nondistended. Bowel sounds present. No organomegaly or mass.  EXTREMITIES: No pedal edema, cyanosis, or clubbing.  NEUROLOGIC: Cranial nerves II through XII are intact. Muscle strength 4/5 in all extremities. Sensation intact. Gait not checked.  PSYCHIATRIC: The patient is alert and oriented x 3.  SKIN: No obvious rash, lesion, or ulcer.   Physical Exam LABORATORY PANEL:   CBC Recent Labs  Lab 05/17/17 0538  WBC 6.5  HGB 12.9  HCT 37.1  PLT 139*   ------------------------------------------------------------------------------------------------------------------  Chemistries  Recent Labs  Lab 05/15/17 1758  05/18/17 0550  NA 139   < > 140  K 3.2*   < > 3.0*  CL 106   < > 108  CO2 18*   < > 22  GLUCOSE 88   < > 88  BUN <5*   < > <5*  CREATININE 0.56   < > 0.50  CALCIUM 8.3*   < > 7.4*  MG 1.6*   < > 1.6*  AST  18  --   --   ALT 9*  --   --   ALKPHOS 48  --   --   BILITOT 1.4*  --   --    < > = values in this interval not displayed.   ------------------------------------------------------------------------------------------------------------------  Cardiac Enzymes No results for input(s): TROPONINI in the last 168 hours. ------------------------------------------------------------------------------------------------------------------  RADIOLOGY:  No results  found.  ASSESSMENT AND PLAN:   Active Problems:   Clostridium difficile colitis   Protein-calorie malnutrition, severe  * C. difficile colitis Failed outpatient treatment.   continue vancomycin p.o.  Zofran as needed.  IV fluids support.  Appreciated consult by GI, added IV Flagyl.   Will call surgical consult if any worsening in the condition then we have to suspect toxic megacolon.   Still had 5-6 was bowel movement last night, continue monitoring.   Overall feeling better.  * Rectal bleeding.  Follow hemoglobin and GI consult. Hemoglobin stable, likely due to C. difficile colitis.   GI suggested to monitor for now.    * Hypokalemia.  Give potassium supplement and follow-up potassium and magnesium level.   Admission is low, replace IV. Patient cannot take oral potassium tablets, will replace IV.   Today explained patient to try to take oral tablet, she agreed.   * Hypertension, controlled.   Monitor with IV fluid.   Stable.  * Hypophosphatemia   Replace by pharmacist as needed.  Need PT eval.   All the records are reviewed and case discussed with Care Management/Social Workerr. Management plans discussed with the patient, family and they are in agreement.  CODE STATUS: Full.  TOTAL TIME TAKING CARE OF THIS PATIENT: 35 minutes.    we may get physical therapy once patient's electrolytes are stable.   discussed with her husband in the room today.  POSSIBLE D/C IN 1-2 DAYS, DEPENDING ON CLINICAL CONDITION.   Vaughan Basta M.D on 05/18/2017   Between 7am to 6pm - Pager - [phone]  After 6pm go to [email or web address] - password [email or web address] [facility]  Office  [phone]  CC: Primary care physician; Juline Patch, MD  Note: This dictation was prepared with Dragon dictation along with smaller phrase technology. Any transcriptional errors that result from this process are unintentional.

## 2017-05-19 LAB — BASIC METABOLIC PANEL
ANION GAP: 7 (ref 5–15)
CALCIUM: 7 mg/dL — AB (ref 8.9–10.3)
CO2: 25 mmol/L (ref 22–32)
Chloride: 108 mmol/L (ref 101–111)
Creatinine, Ser: 0.41 mg/dL — ABNORMAL LOW (ref 0.44–1.00)
GFR calc Af Amer: >60 mL/min (ref >60)
GLUCOSE: 82 mg/dL (ref 65–99)
POTASSIUM: 3.3 mmol/L — AB (ref 3.5–5.1)
Sodium: 140 mmol/L (ref 135–145)

## 2017-05-19 LAB — PHOSPHORUS: Phosphorus: 2.2 mg/dL — ABNORMAL LOW (ref 2.5–4.6)

## 2017-05-19 LAB — MAGNESIUM: Magnesium: 1.7 mg/dL (ref 1.7–2.4)

## 2017-05-19 IMAGING — CR DG WRIST COMPLETE 3+V*L*
4 series · 4 of 4 positions shown · non-contrast
Comparison: No recent prior .

CLINICAL DATA: Fall.

EXAM:
LEFT WRIST - COMPLETE 3+ VIEW

[wrist pa]
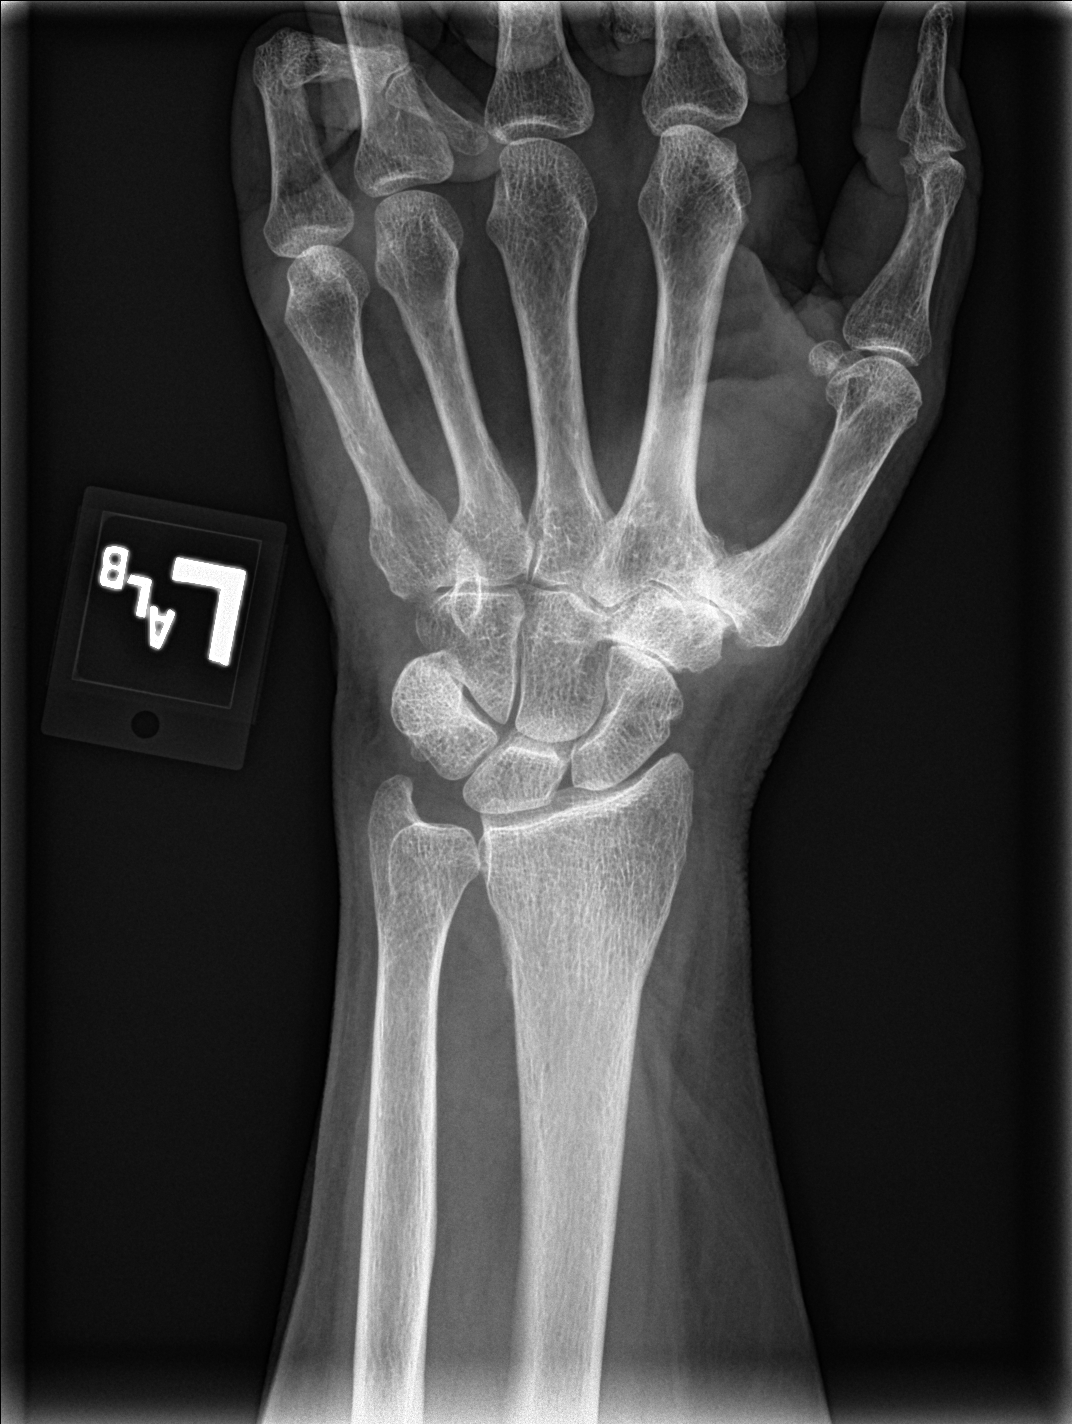

[wrist obl]
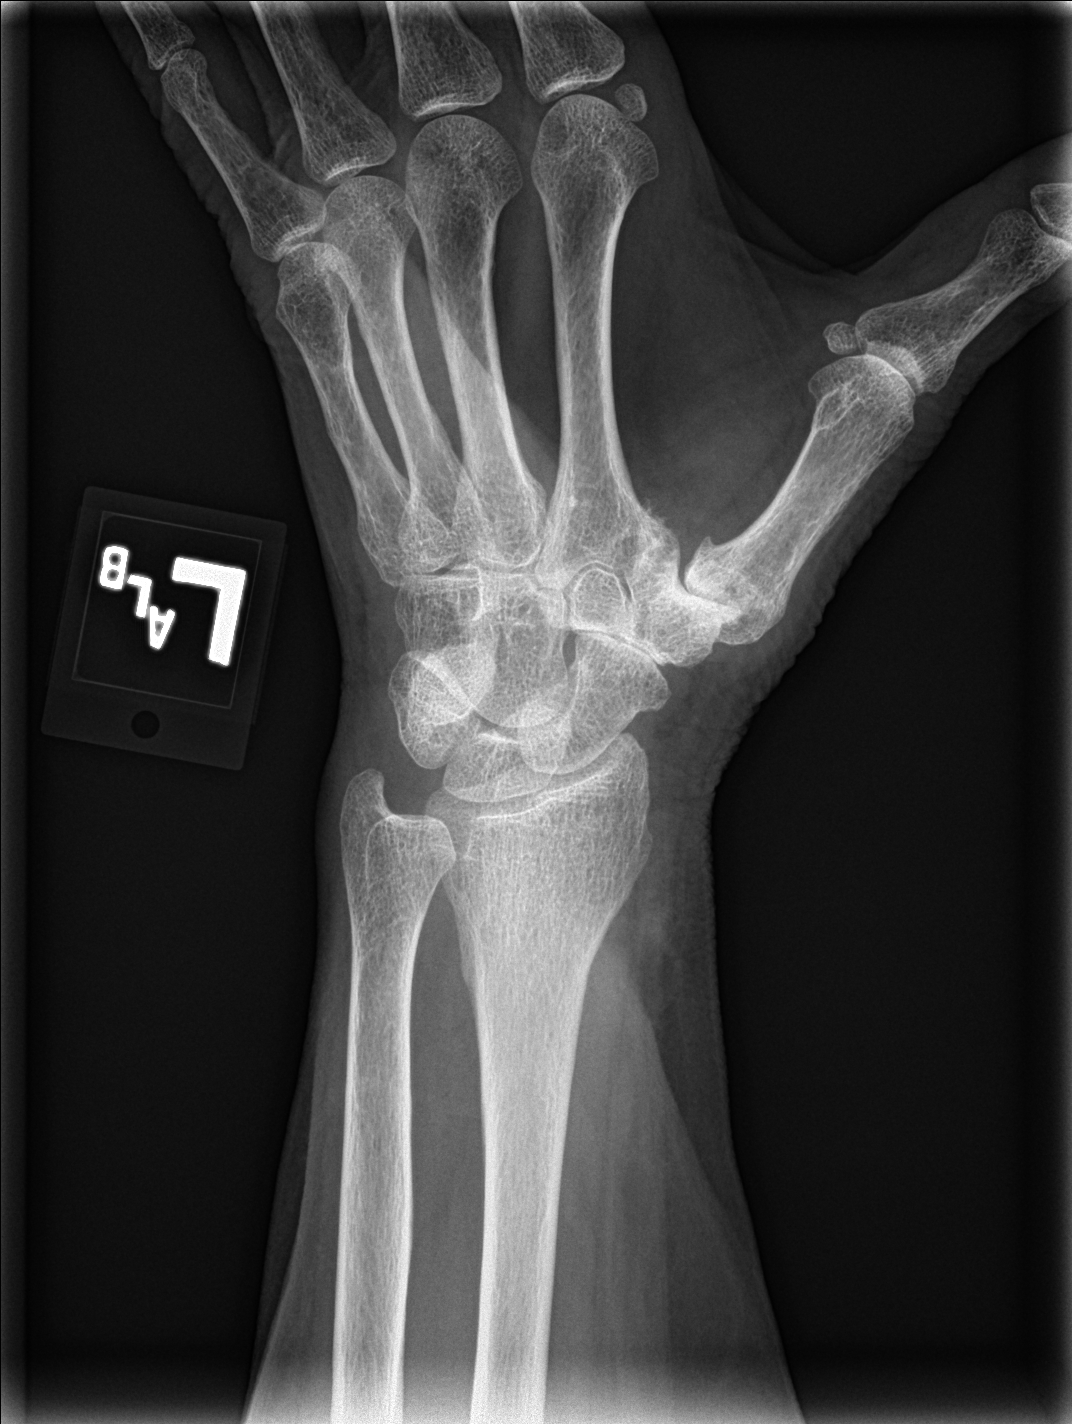

[wrist lat]
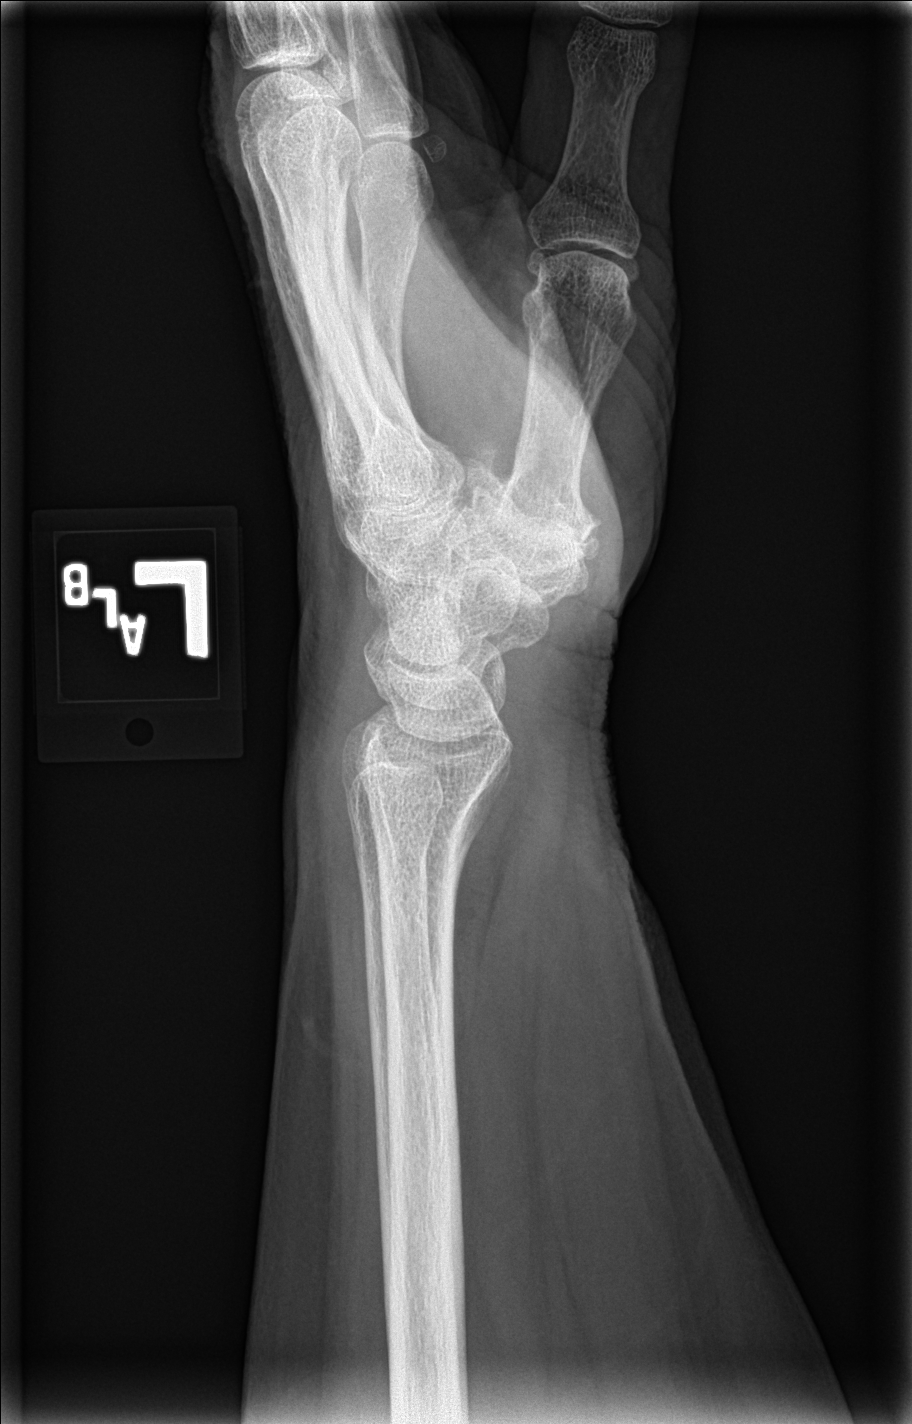

[wrist navicular]
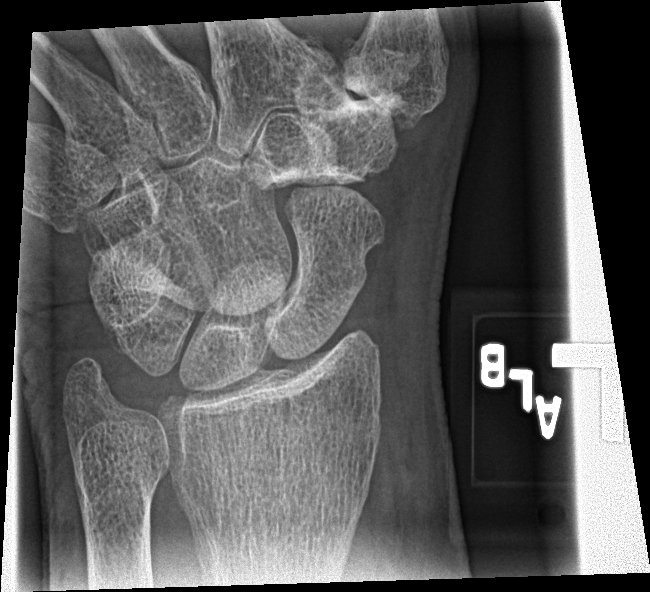

[4 of 4 positions shown; findings below may reference images not displayed]

FINDINGS: Diffuse degenerative change. Degenerative changes most prominent
first carpometacarpal joint. No acute abnormality. No evidence of
fracture or dislocation.
IMPRESSION: Over 1 Diffuse degenerative change. Degenerative changes most
prominent about first carpometacarpal joint.

2. No acute abnormality.

## 2017-05-19 MED ORDER — POTASSIUM PHOSPHATES 15 MMOLE/5ML IV SOLN
10.0000 mmol | Freq: Once | INTRAVENOUS | Status: AC
  Administered 2017-05-19: 10 mmol via INTRAVENOUS
  Filled 2017-05-19: qty 3.33

## 2017-05-19 MED ORDER — POTASSIUM CHLORIDE CRYS ER 20 MEQ PO TBCR
20.0000 meq | EXTENDED_RELEASE_TABLET | Freq: Once | ORAL | Status: AC
  Administered 2017-05-19: 20 meq via ORAL
  Filled 2017-05-19: qty 1

## 2017-05-19 NOTE — NC FL2 (Signed)
[place] LEVEL OF CARE SCREENING TOOL     IDENTIFICATION  Patient Name: Lindsay Stephenson Birthdate: [birth year] Sex: female Admission Date (Current Location): 05/15/2017  [place] and [place] Number:  Engineering geologist and Address:  [facility], [address] [place], [place] [address]      Provider Number: [id number]  Attending Physician Name and Address:  Vaughan Basta, *  Relative Name and Phone Number:       Current Level of Care: Hospital Recommended Level of Care: [place] Prior Approval Number:    Date Approved/Denied:   PASRR Number:    Discharge Plan: SNF    Current Diagnoses: Patient Active Problem List   Diagnosis Date Noted  . Protein-calorie malnutrition, severe 05/17/2017  . Clostridium difficile colitis 05/15/2017  . Metallic taste 60/73/7106  . 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 DDD (degenerative disc disease), lumbar 03/22/2016  . Vitamin B12 deficiency 03/22/2016  . Vitamin D deficiency, unspecified 03/22/2016  . Abnormality of gait due to impairment of balance 03/01/2016  . Arthralgia of multiple joints 03/01/2016  . Blood in stool   . Benign neoplasm of transverse colon   . Inflammatory bowel disease   . Paroxysmal supraventricular tachycardia ([place]) 11/18/2015  . Shortness of breath 10/29/2015  . H/O fall 10/23/2015  . 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 Hyperlipidemia with target LDL less than 100 10/01/2015  . Hypertension, essential, benign 10/01/2015  . Esophageal reflux 10/01/2015  . LABD (linear immunoglobulin A bullous dermatosis) 10/01/2015  . OSA on CPAP 12/12/2013  . Pelvic relaxation due to cystocele 09/01/2012  . Rectocele 09/01/2012  . Vaginal vault prolapse, posthysterectomy 09/01/2012  . Primary osteoarthritis of left knee 11/04/2011    Orientation RESPIRATION BLADDER Height & Weight     Self, Time, Situation, Place  Normal Continent Weight: 190  lb (86.2 kg) Height:  5' (152.4 cm)  BEHAVIORAL SYMPTOMS/MOOD NEUROLOGICAL BOWEL NUTRITION STATUS  (none) (none) Incontinent Diet(soft)  AMBULATORY STATUS COMMUNICATION OF NEEDS Skin   Extensive Assist Verbally Normal                       Personal Care Assistance Level of Assistance  Bathing, Dressing Bathing Assistance: Limited assistance   Dressing Assistance: Limited assistance     Functional Limitations Info  Hearing   Hearing Info: Impaired      SPECIAL CARE FACTORS FREQUENCY  PT (By licensed PT)                    Contractures Contractures Info: Not present    Additional Factors Info  Code Status, Allergies Code Status Info: full Allergies Info: enetex LQ           Current Medications (05/19/2017):  This is the current hospital active medication list Current Facility-Administered Medications  Medication Dose Route Frequency Provider Last Rate Last Dose  . acetaminophen (TYLENOL) tablet 650 mg  650 mg Oral Q6H PRN Demetrios Loll, MD       Or  . acetaminophen (TYLENOL) suppository 650 mg  650 mg Rectal Q6H PRN Demetrios Loll, MD      . albuterol (PROVENTIL) (2.5 MG/3ML) 0.083% nebulizer solution 2.5 mg  2.5 mg Nebulization Q2H PRN Demetrios Loll, MD      . alum & mag hydroxide-simeth (MAALOX/MYLANTA) 200-200-20 MG/5ML suspension 30 mL  30 mL Oral PRN Vaughan Basta, MD   30 mL at 05/17/17 0224  . enoxaparin (LOVENOX) injection 40 mg  40 mg Subcutaneous Q24H Demetrios Loll, MD   40 mg at 05/18/17 2100  . folic acid (FOLVITE) tablet 1 mg  1 mg Oral Daily Demetrios Loll, MD   1 mg at 05/19/17 1054  . gabapentin (NEURONTIN) capsule 100 mg  100 mg Oral QHS Demetrios Loll, MD   100 mg at 05/18/17 2100  . ketorolac (TORADOL) 15 MG/ML injection 15 mg  15 mg Intravenous Q6H PRN Demetrios Loll, MD      . magnesium oxide (MAG-OX) tablet 400 mg  400 mg Oral Q4H PRN Harrie Foreman, MD   400 mg at 05/17/17 0109  . metroNIDAZOLE (FLAGYL) IVPB 500 mg  500 mg Intravenous Q8H Vaughan Basta, MD   Stopped at 05/19/17 [phone]  . multivitamin with minerals tablet 1 tablet  1 tablet Oral Daily Vaughan Basta, MD   1 tablet at 05/19/17 1053  . ondansetron (ZOFRAN) tablet 4 mg  4 mg Oral Q6H PRN Demetrios Loll, MD       Or  . ondansetron [facility]) injection 4 mg  4 mg Intravenous Q6H PRN Demetrios Loll, MD   4 mg at 05/17/17 1719  . pantoprazole (PROTONIX) EC tablet 40 mg  40 mg Oral Daily Vaughan Basta, MD   40 mg at 05/19/17 1054  . potassium PHOSPHATE 10 mmol in dextrose 5 % 250 mL infusion  10 mmol Intravenous Once Coffee, Donna Christen, RPH 42 mL/hr at 05/19/17 1315 10 mmol at 05/19/17 1315  . predniSONE (DELTASONE) tablet 10 mg  10 mg Oral Q breakfast Demetrios Loll, MD   10 mg at 05/19/17 1053  . vancomycin (VANCOCIN) 50 mg/mL oral solution 125 mg  125 mg Oral Q6H Lance Coon, MD   125 mg at 05/19/17 1053     Discharge Medications: Please see discharge summary for a list of discharge medications.  Relevant Imaging Results:  Relevant Lab Results:   Additional Information ss: [id number]  Shela Leff, LCSW

## 2017-05-19 NOTE — Progress Notes (Signed)
MEDICATION RELATED CONSULT NOTE - INITIAL   Pharmacy Consult for electrolyte management  Indication: hypophosphatemia, hypokalemia, and hypomagnesia   Allergies  Allergen Reactions  . Entex Lq [Phenylephrine-Guaifenesin] Swelling    TONGUE  . Codeine Hives and Other (See Comments)    Pt reports only having symptoms after receiving large amounts of medication  . Sulfa Antibiotics Itching  . Tape Other (See Comments)    CAN PULL OFF LAYER OF SKIN    Patient Measurements: Height: 5' (152.4 cm) Weight: 190 lb (86.2 kg) IBW/kg (Calculated) : 45.5 Adjusted Body Weight:   Vital Signs: Temp: 98.5 F (36.9 C) (02/14 0617) BP: 119/61 (02/14 0617) Pulse Rate: 88 (02/14 0617) Intake/Output from previous day: 02/13 0701 - 02/14 0700 In: 1263 [I.V.:1055; IV Piggyback:208] Out: 0  Intake/Output from this shift: No intake/output data recorded.  Labs: Recent Labs    05/17/17 0538 05/18/17 0550 05/19/17 0453  WBC 6.5  --   --   HGB 12.9  --   --   HCT 37.1  --   --   PLT 139*  --   --   CREATININE 0.49 0.50 0.41*  MG 1.8 1.6* 1.7  PHOS 1.4* 1.5* 2.2*   Estimated Creatinine Clearance: 63.8 mL/min (A) (by C-G formula based on SCr of 0.41 mg/dL (L)).   Microbiology: Recent Results (from the past 720 hour(s))  Urine Culture     Status: Abnormal   Collection Time: 05/09/17  1:21 PM  Result Value Ref Range Status   Specimen Description   Final    URINE, RANDOM Performed at [facility], [address] [place], [place] [address]    Special Requests   Final    NONE Performed at [facility], [address] [place], [place] [address]    Culture MULTIPLE SPECIES PRESENT, SUGGEST RECOLLECTION (A)  Final   Report Status 05/11/2017 FINAL  Final  C difficile quick scan w PCR reflex     Status: Abnormal   Collection Time: 05/11/17 10:30 AM  Result Value Ref Range Status   C Diff antigen POSITIVE (A) NEGATIVE Final   C Diff toxin NEGATIVE NEGATIVE Final   C  Diff interpretation Results are indeterminate. See PCR results.  Final    Comment: Performed at [facility], [address] [place], [place] [address]  C. Diff by PCR, Reflexed     Status: Abnormal   Collection Time: 05/11/17 10:30 AM  Result Value Ref Range Status   Toxigenic C. Difficile by PCR POSITIVE (A) NEGATIVE Final    Comment: Positive for toxigenic C. difficile with little to no toxin production. Only treat if clinical presentation suggests symptomatic illness. Performed at [facility], [address] [place], [place] [address]   Gastrointestinal Panel by PCR , Stool     Status: None   Collection Time: 05/15/17  2:41 PM  Result Value Ref Range Status   Campylobacter species NOT DETECTED NOT DETECTED Final   Plesimonas shigelloides NOT DETECTED NOT DETECTED Final   Salmonella species NOT DETECTED NOT DETECTED Final   Yersinia enterocolitica NOT DETECTED NOT DETECTED Final   Vibrio species NOT DETECTED NOT DETECTED Final   Vibrio cholerae NOT DETECTED NOT DETECTED Final   Enteroaggregative E coli (EAEC) NOT DETECTED NOT DETECTED Final   Enteropathogenic E coli (EPEC) NOT DETECTED NOT DETECTED Final   Enterotoxigenic E coli (ETEC) NOT DETECTED NOT DETECTED Final   Shiga like toxin producing E coli (STEC) NOT DETECTED NOT DETECTED  Final   Shigella/Enteroinvasive E coli (EIEC) NOT DETECTED NOT DETECTED Final   Cryptosporidium NOT DETECTED NOT DETECTED Final   Cyclospora cayetanensis NOT DETECTED NOT DETECTED Final   Entamoeba histolytica NOT DETECTED NOT DETECTED Final   Giardia lamblia NOT DETECTED NOT DETECTED Final   Adenovirus F40/41 NOT DETECTED NOT DETECTED Final   Astrovirus NOT DETECTED NOT DETECTED Final   Norovirus GI/GII NOT DETECTED NOT DETECTED Final   Rotavirus A NOT DETECTED NOT DETECTED Final   Sapovirus (I, II, IV, and V) NOT DETECTED NOT DETECTED Final    Comment: Performed at [facility], [address], [place],  [place] [address]    Medical History: Past Medical History:  Diagnosis Date  . Allergy   . Anemia    TAKING IRON  . Arthritis    KNEES AND HANDS  . Asthma    COUGHING, NO ATTACKS IN A YEAR OR 2  . Dyspnea    ON EXERTION  . GERD (gastroesophageal reflux disease)   . HOH (hard of hearing)    30-35 % HEARING LOSS  . Hyperlipidemia   . Hypertension    HX OF  . LABD (linear immunoglobulin A bullous dermatosis)   . Mitral valve prolapse   . Mitral valve prolapse   . 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 Parkinson's disease ([place])   . Rectal bleeding   . Sleep apnea    CPAP  . Vertigo     Medications:  Medications Prior to Admission  Medication Sig Dispense Refill Last Dose  . albuterol (PROVENTIL HFA;VENTOLIN HFA) 108 (90 Base) MCG/ACT inhaler Inhale 1-2 puffs into the lungs every 6 (six) hours as needed for wheezing or shortness of breath. 1 Inhaler 1 PRN at PRN  . Calcium Carbonate-Vit D-Min (CALCIUM 1200 PO) Take by mouth.   0/04/6008 at AM  . folic acid (FOLVITE) 1 MG tablet Take 1 mg by mouth daily.    05/14/2017 at AM  . gabapentin (NEURONTIN) 100 MG capsule Take 100 mg by mouth at bedtime. Dr Sharol Roussel   05/14/2017 at PM  . levocetirizine (XYZAL) 5 MG tablet Take 1 tablet (5 mg total) by mouth as needed. 90 tablet 1 PRN at PRN  . methotrexate 2.5 MG tablet Take 10 mg by mouth once a week. Takes on Tuesdays   05/10/2017 at AM  . Multiple Vitamin (MULTIVITAMIN) capsule Take 1 capsule by mouth daily.   05/14/2017 at AM  . nystatin (MYCOSTATIN) 100000 UNIT/ML suspension Take 5 mLs (500,000 Units total) by mouth 4 (four) times daily. 60 mL 0 PRN at PRN  . omeprazole (PRILOSEC) 20 MG capsule Take 1 capsule (20 mg total) by mouth daily. 90 capsule 3 05/15/2017 at AM  . ondansetron (ZOFRAN ODT) 4 MG disintegrating tablet Take 1 tablet (4 mg total) by mouth every 8 (eight) hours as needed for nausea or vomiting. 20 tablet 0 PRN at PRN  . polyethylene glycol  (MIRALAX / GLYCOLAX) packet Take 17 g by mouth every other day.    PRN at PRN  . potassium chloride SA (K-DUR,KLOR-CON) 20 MEQ tablet Take 1 tablet (20 mEq total) by mouth 2 (two) times daily. 60 tablet 1 05/14/2017 at AM  . predniSONE (DELTASONE) 5 MG tablet Take 10 mg by mouth.    05/14/2017 at AM  . AMBULATORY NON FORMULARY MEDICATION 1 Units by Other route daily. Right AFO 1 Units 0 UTD at UTD  . amoxicillin-clavulanate (AUGMENTIN) 875-125 MG tablet  Take 1 tablet by mouth 2 (two) times daily. (Patient not taking: Reported on 05/15/2017) 20 tablet 0 -- at --  . metroNIDAZOLE (FLAGYL) 500 MG tablet Take 1 tablet (500 mg total) by mouth 3 (three) times daily. (Patient not taking: Reported on 05/15/2017) 52 tablet 0 -- at --  . neomycin-polymyxin-hydrocortisone (CORTISPORIN) 3.5-10000-1 OTIC suspension Place 4 drops into the right ear 3 (three) times daily. (Patient not taking: Reported on 05/15/2017) 10 mL 0 -- at --   Scheduled:  . enoxaparin (LOVENOX) injection  40 mg Subcutaneous Q24H  . folic acid  1 mg Oral Daily  . gabapentin  100 mg Oral QHS  . multivitamin with minerals  1 tablet Oral Daily  . pantoprazole  40 mg Oral Daily  . potassium chloride  20 mEq Oral Once  . predniSONE  10 mg Oral Q breakfast  . vancomycin  125 mg Oral Q6H    Assessment: Pharmacy consulted to manage electrolytes in this 72 year old woman admitted for rectal bleeding and found to have Cdiff.   Goal of Therapy:  K-3.5-5.0; Magnesium: 1.8-2.4; Phos 2.5-4.6  Plan:  Will give KPhos 10 mmol IV x 1 and KCL po 22mq x 1 dose. Will recheck electrolytes in am.   GDonna ChristenCoffee 05/19/2017,12:03 PM

## 2017-05-19 NOTE — Progress Notes (Signed)
Physical Therapy Treatment Patient Details Name: Lindsay Stephenson MRN: [id number] DOB: [birth year] Today's Date: 05/19/2017    History of Present Illness Pt admitted for Cdiff. Pt complains of rectal bleeding and abdominal pain and complaints of diarrhea x 7-10 days. History includes GERD, HTN, and mitral valve prolapse. K+ improved this date.    PT Comments    Pt ready for session.  To edge of bed with min assist to initiate RLE movement.  Once started was able to complete on her own with increased time.  Sitting EOB without support and supervision.  Pt unable to stand from lower bed height and required bed raised and min assist.  She took 2 steps forward then needed to return to bed as she felt a BM coming.  Obtained commode as she was unable to walk to bathroom.  Transferred with min a x 1 and voiced fear of falling.  Encouragement provided.  Stood from commode with min a x 2 then was able to walk 3' to recliner at bedside with 180 turn.  Gait slow and cautious.  Participated in exercises as described below.  Declined further gait due to fatigue.  Discussed with pt discharge plan.  She has been unable to walk an significant distance with therapy.  She stated she has gotten progressively weaker since mid January.  Will change discharge recommendations at this time to SNF as mobility has not progressed during session today.  Pt is at an increased risk for falls due to weakness and fear off falling.  Pt agreeable to SNF if available to her.   Follow Up Recommendations  SNF     Equipment Recommendations  3in1 (PT)    Recommendations for Other Services       Precautions / Restrictions Precautions Precautions: Fall Restrictions Weight Bearing Restrictions: No    Mobility  Bed Mobility Overal bed mobility: Needs Assistance Bed Mobility: Supine to Sit     Supine to sit: Min assist     General bed mobility comments: assist to initiate LE movement  Transfers Overall transfer level:  Needs assistance Equipment used: 4-wheeled walker Transfers: Sit to/from Stand              Ambulation/Gait Ambulation/Gait assistance: Min assist Ambulation Distance (Feet): 5 Feet Assistive device: 4-wheeled walker Gait Pattern/deviations: Step-to pattern;Decreased step length - right;Decreased step length - left;Shuffle   Gait velocity interpretation: <1.8 ft/sec, indicative of risk for recurrent falls General Gait Details: generally fearful of falling during gait.  Limited to trasfers only due to fatigue.   Stairs            Wheelchair Mobility    Modified Rankin (Stroke Patients Only)       Balance Overall balance assessment: Needs assistance Sitting-balance support: Feet supported Sitting balance-Leahy Scale: Good     Standing balance support: Bilateral upper extremity supported Standing balance-Leahy Scale: Fair                              Cognition Arousal/Alertness: Awake/alert Behavior During Therapy: WFL for tasks assessed/performed Overall Cognitive Status: Within Functional Limits for tasks assessed                                        Exercises Other Exercises Other Exercises: Pt remained sitting in recliner with LE's dependant per her request to do HEP  on own. Other Exercises: Transfer to commode to void and have BM    General Comments        Pertinent Vitals/Pain Pain Assessment: No/denies pain    Home Living                      Prior Function            PT Goals (current goals can now be found in the care plan section) Progress towards PT goals: Progressing toward goals    Frequency    Min 2X/week      PT Plan Discharge plan needs to be updated    Co-evaluation              AM-PAC PT "6 Clicks" Daily Activity  Outcome Measure  Difficulty turning over in bed (including adjusting bedclothes, sheets and blankets)?: Unable Difficulty moving from lying on back to sitting  on the side of the bed? : Unable Difficulty sitting down on and standing up from a chair with arms (e.g., wheelchair, bedside commode, etc,.)?: Unable Help needed moving to and from a bed to chair (including a wheelchair)?: A Little Help needed walking in hospital room?: A Lot Help needed climbing 3-5 steps with a railing? : Total 6 Click Score: 9    End of Session Equipment Utilized During Treatment: Gait belt Activity Tolerance: Patient tolerated treatment well;Patient limited by fatigue Patient left: in chair;with chair alarm set;with call bell/phone within reach;with nursing/sitter in room Nurse Communication: Mobility status       Time: 9122-5834 PT Time Calculation (min) (ACUTE ONLY): 28 min  Charges:  $Gait Training: 8-22 mins $Therapeutic Activity: 8-22 mins                    G Codes:       Chesley Noon, PTA 05/19/17, 11:14 AM

## 2017-05-19 NOTE — Progress Notes (Signed)
[facility] [place] NAME: Lindsay Stephenson    MR#:  [id number]  DATE OF BIRTH:  [birth year]  SUBJECTIVE:  CHIEF COMPLAINT:   Chief Complaint  Patient presents with  . Rectal Bleeding  . Abdominal Pain  Received some antibiotic for her ear infection 2-3 weeks ago, since then have diarrhea, nausea, not able to eat enough and loss of strength. Came to emergency room last week and at urgent care Center found to have C. difficile so given oral Flagyl. She could not tolerate it and continued to have symptoms so came back and admitted for management of C. difficile with IV antibiotics. She also had some blood in her stool .   For slightly better now, able to tolerate liquids orally some. Still had 5-6 loose bowel movement last night. Less abd pain and nausea.  REVIEW OF SYSTEMS:  CONSTITUTIONAL: No fever, positive for fatigue or weakness.  EYES: No blurred or double vision.  EARS, NOSE, AND THROAT: No tinnitus or ear pain.  RESPIRATORY: No cough, shortness of breath, wheezing or hemoptysis.  CARDIOVASCULAR: No chest pain, orthopnea, edema.  GASTROINTESTINAL: No nausea, vomiting, positive for diarrhea or abdominal pain.  GENITOURINARY: No dysuria, hematuria.  ENDOCRINE: No polyuria, nocturia,  HEMATOLOGY: No anemia, easy bruising or bleeding SKIN: No rash or lesion. MUSCULOSKELETAL: No joint pain or arthritis.   NEUROLOGIC: No tingling, numbness, weakness.  PSYCHIATRY: No anxiety or depression.   ROS  DRUG ALLERGIES:   Allergies  Allergen Reactions  . Entex Lq [Phenylephrine-Guaifenesin] Swelling    TONGUE  . Codeine Hives and Other (See Comments)    Pt reports only having symptoms after receiving large amounts of medication  . Sulfa Antibiotics Itching  . Tape Other (See Comments)    CAN PULL OFF LAYER OF SKIN    VITALS:  Blood pressure 119/61, pulse 88, temperature 98.5 F (36.9 C), resp. rate 17, height 5' (1.524 m), weight 86.2 kg (190  lb), SpO2 100 %.  PHYSICAL EXAMINATION:  GENERAL:  72 y.o.-year-old patient lying in the bed with no acute distress.  EYES: Pupils equal, round, reactive to light and accommodation. No scleral icterus. Extraocular muscles intact.  HEENT: Head atraumatic, normocephalic. Oropharynx and nasopharynx clear.  NECK:  Supple, no jugular venous distention. No thyroid enlargement, no tenderness.  LUNGS: Normal breath sounds bilaterally, no wheezing, rales,rhonchi or crepitation. No use of accessory muscles of respiration.  CARDIOVASCULAR: S1, S2 normal. No murmurs, rubs, or gallops.  ABDOMEN: Soft, tender, nondistended. Bowel sounds present. No organomegaly or mass.  EXTREMITIES: No pedal edema, cyanosis, or clubbing.  NEUROLOGIC: Cranial nerves II through XII are intact. Muscle strength 4/5 in all extremities. Sensation intact. Gait not checked.  PSYCHIATRIC: The patient is alert and oriented x 3.  SKIN: No obvious rash, lesion, or ulcer.   Physical Exam LABORATORY PANEL:   CBC Recent Labs  Lab 05/17/17 0538  WBC 6.5  HGB 12.9  HCT 37.1  PLT 139*   ------------------------------------------------------------------------------------------------------------------  Chemistries  Recent Labs  Lab 05/15/17 1758  05/19/17 0453  NA 139   < > 140  K 3.2*   < > 3.3*  CL 106   < > 108  CO2 18*   < > 25  GLUCOSE 88   < > 82  BUN <5*   < > <5*  CREATININE 0.56   < > 0.41*  CALCIUM 8.3*   < > 7.0*  MG 1.6*   < > 1.7  AST 18  --   --   ALT 9*  --   --   ALKPHOS 48  --   --   BILITOT 1.4*  --   --    < > = values in this interval not displayed.   ------------------------------------------------------------------------------------------------------------------  Cardiac Enzymes No results for input(s): TROPONINI in the last 168 hours. ------------------------------------------------------------------------------------------------------------------  RADIOLOGY:  No results  found.  ASSESSMENT AND PLAN:   Active Problems:   Clostridium difficile colitis   Protein-calorie malnutrition, severe  * C. difficile colitis Failed outpatient treatment.   continue vancomycin p.o.  Zofran as needed.  IV fluids support.  Appreciated consult by GI, added IV Flagyl.   Will call surgical consult if any worsening in the condition then we have to suspect toxic megacolon.   Still had 4-5 was bowel movement last night, continue monitoring.   Overall feeling better. Upgrade die to soft.  * Rectal bleeding.  Follow hemoglobin and GI consult. Hemoglobin stable, likely due to C. difficile colitis.   GI suggested to monitor for now.     * Hypokalemia.  Give potassium supplement and follow-up potassium and magnesium level.   Admission is low, replace IV. Replace oral, Improving.  * Hypertension, controlled.   Monitor with IV fluid.   Stable.  * Hypophosphatemia   Replace by pharmacist as needed.  PT suggest SNF now, but will monitor with ongoing improvement.   All the records are reviewed and case discussed with Care Management/Social Workerr. Management plans discussed with the patient, family and they are in agreement.  CODE STATUS: Full.  TOTAL TIME TAKING CARE OF THIS PATIENT: 35 minutes.    we may get physical therapy once patient's electrolytes are stable.   discussed with her husband in the room today.  POSSIBLE D/C IN 1-2 DAYS, DEPENDING ON CLINICAL CONDITION. Still have uncontrolled Loose watery diarrhea in bed this morning, need to monitor in hospital for improvement in that,  Vaughan Basta M.D on 05/19/2017   Between 7am to 6pm - Pager - [phone]  After 6pm go to [email or web address] - password [email or web address] [facility]  Office  [phone]  CC: Primary care physician; Juline Patch, MD  Note: This dictation was prepared with Dragon dictation along with smaller phrase technology. Any transcriptional errors that result  from this process are unintentional.

## 2017-05-19 NOTE — Clinical Social Work Note (Signed)
Clinical Social Work Assessment  Patient Details  Name: Lindsay Stephenson MRN: [id number] Date of Birth: February 07, 1946  Date of referral:  05/19/17               Reason for consult:  Discharge Planning                Permission sought to share information with:  Facility Sport and exercise psychologist, Family Supports Permission granted to share information::  Yes, Verbal Permission Granted  Name::        Agency::     Relationship::     Contact Information:     Housing/Transportation Living arrangements for the past 2 months:  Single Family Home Source of Information:  Patient Patient Interpreter Needed:  None Criminal Activity/Legal Involvement Pertinent to Current Situation/Hospitalization:  No - Comment as needed Significant Relationships:  Adult Children, Spouse Lives with:  Spouse Do you feel safe going back to the place where you live?  Yes Need for family participation in patient care:  Yes (Comment)  Care giving concerns:  Patient resides with her husband.    Social Worker assessment / plan:  PT recommended home health yesterday and is now recommending STR today. CSW spoke with patient and introduced self and explained purpose of visit. Patient is wanting to go for STR and states she has been to STR in the past when she and her husband lived in [place]. Patient is unfamiliar with the facilities in this area and has lived here for about 1 year and a half. Patient stated that she is interested in [place] as a possibility because it is right down the road from her. CSW explained that with her [place], we will have to see what coverage and benefits she has. Patient expressed understanding.  Employment status:  Retired Nurse, adult PT Recommendations:  Portal / Referral to community resources:  [place]  Patient/Family's Response to care:  Patient expressed appreciation for CSW  assistance.  Patient/Family's Understanding of and Emotional Response to Diagnosis, Current Treatment, and Prognosis:  Patient very in tune with her treatment.  Emotional Assessment Appearance:  Appears stated age Attitude/Demeanor/Rapport:  (pleasant and cooperative) Affect (typically observed):  Accepting, Adaptable, Appropriate Orientation:  Oriented to Self, Oriented to Place, Oriented to  Time, Oriented to Situation Alcohol / Substance use:  Not Applicable Psych involvement (Current and /or in the community):  No (Comment)  Discharge Needs  Concerns to be addressed:  Care Coordination Readmission within the last 30 days:  No Current discharge risk:  None Barriers to Discharge:  No Barriers Identified   Shela Leff, LCSW 05/19/2017, 3:54 PM

## 2017-05-20 LAB — BASIC METABOLIC PANEL
Anion gap: 10 (ref 5–15)
BUN: <5 mg/dL — ABNORMAL LOW (ref 6–20)
CHLORIDE: 104 mmol/L (ref 101–111)
CO2: 25 mmol/L (ref 22–32)
Calcium: 7.5 mg/dL — ABNORMAL LOW (ref 8.9–10.3)
Creatinine, Ser: 0.38 mg/dL — ABNORMAL LOW (ref 0.44–1.00)
GFR calc non Af Amer: >60 mL/min (ref >60)
Glucose, Bld: 82 mg/dL (ref 65–99)
Potassium: 3.1 mmol/L — ABNORMAL LOW (ref 3.5–5.1)
Sodium: 139 mmol/L (ref 135–145)

## 2017-05-20 LAB — CBC
HCT: 38.4 % (ref 35.0–47.0)
HEMOGLOBIN: 12.9 g/dL (ref 12.0–16.0)
MCH: 31.3 pg (ref 26.0–34.0)
MCHC: 33.6 g/dL (ref 32.0–36.0)
MCV: 93.1 fL (ref 80.0–100.0)
Platelets: 116 10*3/uL — ABNORMAL LOW (ref 150–440)
RBC: 4.13 MIL/uL (ref 3.80–5.20)
RDW: 15.8 % — ABNORMAL HIGH (ref 11.5–14.5)
WBC: 7.8 10*3/uL (ref 3.6–11.0)

## 2017-05-20 LAB — PHOSPHORUS: PHOSPHORUS: 2.7 mg/dL (ref 2.5–4.6)

## 2017-05-20 LAB — MAGNESIUM: Magnesium: 1.5 mg/dL — ABNORMAL LOW (ref 1.7–2.4)

## 2017-05-20 MED ORDER — POTASSIUM CHLORIDE CRYS ER 20 MEQ PO TBCR
20.0000 meq | EXTENDED_RELEASE_TABLET | Freq: Once | ORAL | Status: AC
Start: 2017-05-20 — End: 2017-05-20
  Administered 2017-05-20: 20 meq via ORAL
  Filled 2017-05-20: qty 1

## 2017-05-20 MED ORDER — GERHARDT'S BUTT CREAM
TOPICAL_CREAM | Freq: Every day | CUTANEOUS | Status: DC
  Administered 2017-05-20: 22:00:00 via TOPICAL
  Administered 2017-05-21 – 2017-05-22 (×2): 1 via TOPICAL
  Administered 2017-05-23 – 2017-05-24 (×2): via TOPICAL
  Filled 2017-05-20 (×2): qty 1

## 2017-05-20 MED ORDER — VANCOMYCIN 50 MG/ML ORAL SOLUTION: 125.0000 mg | Freq: Four times a day (QID) | ORAL | 0 refills | Status: DC

## 2017-05-20 MED ORDER — MAGNESIUM SULFATE 2 GM/50ML IV SOLN
2.0000 g | Freq: Once | INTRAVENOUS | Status: AC
Start: 2017-05-20 — End: 2017-05-20
  Administered 2017-05-20: 2 g via INTRAVENOUS
  Filled 2017-05-20: qty 50

## 2017-05-20 MED ORDER — METRONIDAZOLE 500 MG PO TABS: 500.0000 mg | ORAL_TABLET | Freq: Three times a day (TID) | ORAL | 0 refills | Status: DC

## 2017-05-20 NOTE — Discharge Summary (Signed)
[facility] [place] NAME: Lindsay Stephenson    MR#:  [id number]  DATE OF BIRTH:  1945-12-08  DATE OF ADMISSION:  05/15/2017 ADMITTING PHYSICIAN: Demetrios Loll, MD  DATE OF DISCHARGE: 05/20/2017  PRIMARY CARE PHYSICIAN: Juline Patch, MD    ADMISSION DIAGNOSIS:  C. difficile colitis [A04.72]  DISCHARGE DIAGNOSIS:  Active Problems:   Clostridium difficile colitis   Protein-calorie malnutrition, severe   SECONDARY DIAGNOSIS:   Past Medical History:  Diagnosis Date  . Allergy   . Anemia    TAKING IRON  . Arthritis    KNEES AND HANDS  . Asthma    COUGHING, NO ATTACKS IN A YEAR OR 2  . Dyspnea    ON EXERTION  . GERD (gastroesophageal reflux disease)   . HOH (hard of hearing)    30-35 % HEARING LOSS  . Hyperlipidemia   . Hypertension    HX OF  . LABD (linear immunoglobulin A bullous dermatosis)   . Mitral valve prolapse   . Mitral valve prolapse   . 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 Parkinson's disease ([place])   . Rectal bleeding   . Sleep apnea    CPAP  . Vertigo     HOSPITAL COURSE:   * C. difficile colitis Failed outpatient treatment.   continue vancomycin p.o. Zofran as needed. IV fluids support.  Appreciated consult by GI, added IV Flagyl.   Overall feeling better. Upgrade die to soft.   Since last afternoon- have semisolid BM and at less frequency. She is also able to Hold it until getting nursing help.   D/c at rehab and follow with GI , Finish 10 days oral vanc + flagyl.  * Rectal bleeding. Follow hemoglobin and GI consult. Hemoglobin stable, likely due to C. difficile colitis.   GI suggested to monitor for now.    Follow at GI clinic as out pt.    * Hypokalemia. Give potassium supplement and follow-up potassium and magnesium level.   Admission is low, replace IV. Replace oral, Improving.  * Hypertension, controlled.   Monitor with IV fluid.   Stable.  *  Hypophosphatemia   Replace by pharmacist as needed.  * generalized weakness   D.c at rehab.   DISCHARGE CONDITIONS:   Stable.  CONSULTS OBTAINED:  Treatment Team:  Virgel Manifold, MD  DRUG ALLERGIES:   Allergies  Allergen Reactions  . Entex Lq [Phenylephrine-Guaifenesin] Swelling    TONGUE  . Codeine Hives and Other (See Comments)    Pt reports only having symptoms after receiving large amounts of medication  . Sulfa Antibiotics Itching  . Tape Other (See Comments)    CAN PULL OFF LAYER OF SKIN    DISCHARGE MEDICATIONS:   Allergies as of 05/20/2017      Reactions   Entex Lq [phenylephrine-guaifenesin] Swelling   TONGUE   Codeine Hives, Other (See Comments)   Pt reports only having symptoms after receiving large amounts of medication   Sulfa Antibiotics Itching   Tape Other (See Comments)   CAN PULL OFF LAYER OF SKIN      Medication List    STOP taking these medications   AMBULATORY NON FORMULARY MEDICATION   amoxicillin-clavulanate 875-125 MG tablet Commonly known as:  AUGMENTIN   neomycin-polymyxin-hydrocortisone 3.5-10000-1 OTIC suspension Commonly known as:  CORTISPORIN   nystatin 100000 UNIT/ML suspension Commonly known as:  MYCOSTATIN   polyethylene glycol packet Commonly known as:  MIRALAX / GLYCOLAX     TAKE these medications   albuterol 108 (90 Base) MCG/ACT inhaler Commonly known as:  PROVENTIL HFA;VENTOLIN HFA Inhale 1-2 puffs into the lungs every 6 (six) hours as needed for wheezing or shortness of breath.   CALCIUM 1200 PO Take by mouth.   folic acid 1 MG tablet Commonly known as:  FOLVITE Take 1 mg by mouth daily.   gabapentin 100 MG capsule Commonly known as:  NEURONTIN Take 100 mg by mouth at bedtime. Dr Sharol Roussel   levocetirizine 5 MG tablet Commonly known as:  XYZAL Take 1 tablet (5 mg total) by mouth as needed.   methotrexate 2.5 MG tablet Take 10 mg by mouth once a week. Takes on Tuesdays   metroNIDAZOLE 500 MG  tablet Commonly known as:  FLAGYL Take 1 tablet (500 mg total) by mouth 3 (three) times daily for 10 days.   multivitamin capsule Take 1 capsule by mouth daily.   omeprazole 20 MG capsule Commonly known as:  PRILOSEC Take 1 capsule (20 mg total) by mouth daily.   ondansetron 4 MG disintegrating tablet Commonly known as:  ZOFRAN ODT Take 1 tablet (4 mg total) by mouth every 8 (eight) hours as needed for nausea or vomiting.   potassium chloride SA 20 MEQ tablet Commonly known as:  K-DUR,KLOR-CON Take 1 tablet (20 mEq total) by mouth 2 (two) times daily.   predniSONE 5 MG tablet Commonly known as:  DELTASONE Take 10 mg by mouth.   vancomycin 50 mg/mL oral solution Commonly known as:  VANCOCIN Take 2.5 mLs (125 mg total) by mouth every 6 (six) hours for 10 days.        DISCHARGE INSTRUCTIONS:    Follow with GI clinic in 2 weeks.  If you experience worsening of your admission symptoms, develop shortness of breath, life threatening emergency, suicidal or homicidal thoughts you must seek medical attention immediately by calling 911 or calling your MD immediately  if symptoms less severe.  You Must read complete instructions/literature along with all the possible adverse reactions/side effects for all the Medicines you take and that have been prescribed to you. Take any new Medicines after you have completely understood and accept all the possible adverse reactions/side effects.   Please note  You were cared for by a hospitalist during your hospital stay. If you have any questions about your discharge medications or the care you received while you were in the hospital after you are discharged, you can call the unit and asked to speak with the hospitalist on call if the hospitalist that took care of you is not available. Once you are discharged, your primary care physician will handle any further medical issues. Please note that NO REFILLS for any discharge medications will be  authorized once you are discharged, as it is imperative that you return to your primary care physician (or establish a relationship with a primary care physician if you do not have one) for your aftercare needs so that they can reassess your need for medications and monitor your lab values.    Today   CHIEF COMPLAINT:   Chief Complaint  Patient presents with  . Rectal Bleeding  . Abdominal Pain    HISTORY OF PRESENT ILLNESS:  Lindsay Stephenson  is a 72 y.o. female with a known history of multiple medical problems as below.  Patient has a persistent diarrhea for the past 7-10 days.  She was diagnosed with C. difficile colitis and given p.o. Flagyl as  outpatient.  Her symptoms has been worsening for the past 4 days.  She complains of abdominal cramp, which is intermittent, 8 out of 10 without radiation.  She also complains of rectal bleeding, nausea and diarrhea but no vomiting.  The CAT scan of the abdomen was negative for colitis several days ago.  But the CAT scan of the abdomen today show Inflammatory or infectious colitis as opposed to diverticulitis is suspected.   VITAL SIGNS:  Blood pressure (!) 129/57, pulse 97, temperature 97.9 F (36.6 C), resp. rate 17, height 5' (1.524 m), weight 86.2 kg (190 lb), SpO2 97 %.  I/O:    Intake/Output Summary (Last 24 hours) at 05/20/2017 1036 Last data filed at 05/20/2017 0515 Gross per 24 hour  Intake 520 ml  Output -  Net 520 ml    PHYSICAL EXAMINATION:   GENERAL:  72 y.o.-year-old patient lying in the bed with no acute distress.  EYES: Pupils equal, round, reactive to light and accommodation. No scleral icterus. Extraocular muscles intact.  HEENT: Head atraumatic, normocephalic. Oropharynx and nasopharynx clear.  NECK:  Supple, no jugular venous distention. No thyroid enlargement, no tenderness.  LUNGS: Normal breath sounds bilaterally, no wheezing, rales,rhonchi or crepitation. No use of accessory muscles of respiration.  CARDIOVASCULAR:  S1, S2 normal. No murmurs, rubs, or gallops.  ABDOMEN: Soft, tender, nondistended. Bowel sounds present. No organomegaly or mass.  EXTREMITIES: No pedal edema, cyanosis, or clubbing.  NEUROLOGIC: Cranial nerves II through XII are intact. Muscle strength 4/5 in all extremities. Sensation intact. Gait not checked.  PSYCHIATRIC: The patient is alert and oriented x 3.  SKIN: No obvious rash, lesion, or ulcer.    DATA REVIEW:   CBC Recent Labs  Lab 05/20/17 0649  WBC 7.8  HGB 12.9  HCT 38.4  PLT 116*    Chemistries  Recent Labs  Lab 05/15/17 1758  05/20/17 0649  NA 139   < > 139  K 3.2*   < > 3.1*  CL 106   < > 104  CO2 18*   < > 25  GLUCOSE 88   < > 82  BUN <5*   < > <5*  CREATININE 0.56   < > 0.38*  CALCIUM 8.3*   < > 7.5*  MG 1.6*   < > 1.5*  AST 18  --   --   ALT 9*  --   --   ALKPHOS 48  --   --   BILITOT 1.4*  --   --    < > = values in this interval not displayed.    Cardiac Enzymes No results for input(s): TROPONINI in the last 168 hours.  Microbiology Results  Results for orders placed or performed during the hospital encounter of 05/15/17  Gastrointestinal Panel by PCR , Stool     Status: None   Collection Time: 05/15/17  2:41 PM  Result Value Ref Range Status   Campylobacter species NOT DETECTED NOT DETECTED Final   Plesimonas shigelloides NOT DETECTED NOT DETECTED Final   Salmonella species NOT DETECTED NOT DETECTED Final   Yersinia enterocolitica NOT DETECTED NOT DETECTED Final   Vibrio species NOT DETECTED NOT DETECTED Final   Vibrio cholerae NOT DETECTED NOT DETECTED Final   Enteroaggregative E coli (EAEC) NOT DETECTED NOT DETECTED Final   Enteropathogenic E coli (EPEC) NOT DETECTED NOT DETECTED Final   Enterotoxigenic E coli (ETEC) NOT DETECTED NOT DETECTED Final   Shiga like toxin producing E coli (STEC) NOT DETECTED NOT  DETECTED Final   Shigella/Enteroinvasive E coli (EIEC) NOT DETECTED NOT DETECTED Final   Cryptosporidium NOT DETECTED NOT  DETECTED Final   Cyclospora cayetanensis NOT DETECTED NOT DETECTED Final   Entamoeba histolytica NOT DETECTED NOT DETECTED Final   Giardia lamblia NOT DETECTED NOT DETECTED Final   Adenovirus F40/41 NOT DETECTED NOT DETECTED Final   Astrovirus NOT DETECTED NOT DETECTED Final   Norovirus GI/GII NOT DETECTED NOT DETECTED Final   Rotavirus A NOT DETECTED NOT DETECTED Final   Sapovirus (I, II, IV, and V) NOT DETECTED NOT DETECTED Final    Comment: Performed at [facility], [address] [place], [place] [address]    RADIOLOGY:  No results found.  EKG:   Orders placed or performed during the hospital encounter of 05/15/17  . ED EKG  . ED EKG      Management plans discussed with the patient, family and they are in agreement.  CODE STATUS:     Code Status Orders  (From admission, onward)        Start     Ordered   05/15/17 2049  Full code  Continuous     05/15/17 2048    Code Status History    Date Active Date Inactive Code Status Order ID Comments User Context   This patient has a current code status but no historical code status.    Advance Directive Documentation     Most Recent Value  Type of Advance Directive  Living will, Healthcare Power of Attorney  Pre-existing out of facility DNR order (yellow form or pink MOST form)  No data  "MOST" Form in Place?  No data      TOTAL TIME TAKING CARE OF THIS PATIENT: 35 minutes.    Vaughan Basta M.D on 05/20/2017 at 10:36 AM  Between 7am to 6pm - Pager - [phone]  After 6pm go to [email or web address] - password [email or web address] [facility]  Office  [phone]  CC: Primary care physician; Juline Patch, MD   Note: This dictation was prepared with Dragon dictation along with smaller phrase technology. Any transcriptional errors that result from this process are unintentional.

## 2017-05-20 NOTE — Progress Notes (Signed)
SNF and Non-Emergent EMS Transport Benefits:  Number called: [phone] Rep: Ulis Rias Reference Number: [id number]  [facility] Local [employer] PPO plan active as of 04/05/17.  $150 deductible, which has been met for calendar year. Out of pocket max is $1500, of which $269.29 met so far.  In-network SNF: patient covered at 100% of the allowable max for SNF once deductible met.  Per rep, no day limit.  Josem Kaufmann is required: [phone] opt 3.   Verified that [employer], [employer], [employer], and [employer] [facility] are considered in-network on [employer].     Non-emergent EMS transport: responsible for 4% co-insurance for medically necessary transport after deductible satisfied.   Auth required: [id number] opt 3.

## 2017-05-20 NOTE — Care Management Important Message (Signed)
Important Message  Patient Details  Name: Lindsay Stephenson MRN: [id number] Date of Birth: Sep 01, 1945   Medicare Important Message Given:  Yes    Beverly Sessions, RN 05/20/2017, 10:40 AM

## 2017-05-20 NOTE — Progress Notes (Signed)
MEDICATION RELATED CONSULT NOTE - INITIAL   Pharmacy Consult for electrolyte management  Indication: hypophosphatemia, hypokalemia, and hypomagnesia   Allergies  Allergen Reactions  . Entex Lq [Phenylephrine-Guaifenesin] Swelling    TONGUE  . Codeine Hives and Other (See Comments)    Pt reports only having symptoms after receiving large amounts of medication  . Sulfa Antibiotics Itching  . Tape Other (See Comments)    CAN PULL OFF LAYER OF SKIN    Patient Measurements: Height: 5' (152.4 cm) Weight: 190 lb (86.2 kg) IBW/kg (Calculated) : 45.5 Adjusted Body Weight:   Vital Signs: Temp: 97.9 F (36.6 C) (02/15 0515) BP: 129/57 (02/15 0515) Pulse Rate: 97 (02/15 0515) Intake/Output from previous day: 02/14 0701 - 02/15 0700 In: 520 [IV Piggyback:520] Out: -  Intake/Output from this shift: No intake/output data recorded.  Labs: Recent Labs    05/18/17 0550 05/19/17 0453 05/20/17 0649  WBC  --   --  7.8  HGB  --   --  12.9  HCT  --   --  38.4  PLT  --   --  116*  CREATININE 0.50 0.41* 0.38*  MG 1.6* 1.7 1.5*  PHOS 1.5* 2.2* 2.7   Estimated Creatinine Clearance: 63.8 mL/min (A) (by C-G formula based on SCr of 0.38 mg/dL (L)).   Microbiology: Recent Results (from the past 720 hour(s))  Urine Culture     Status: Abnormal   Collection Time: 05/09/17  1:21 PM  Result Value Ref Range Status   Specimen Description   Final    URINE, RANDOM Performed at [facility], [address], [place], [place] [address]    Special Requests   Final    NONE Performed at [facility], [address] [place], [place] [address]    Culture MULTIPLE SPECIES PRESENT, SUGGEST RECOLLECTION (A)  Final   Report Status 05/11/2017 FINAL  Final  C difficile quick scan w PCR reflex     Status: Abnormal   Collection Time: 05/11/17 10:30 AM  Result Value Ref Range Status   C Diff antigen POSITIVE (A) NEGATIVE Final   C Diff toxin NEGATIVE NEGATIVE Final   C Diff  interpretation Results are indeterminate. See PCR results.  Final    Comment: Performed at [facility], [address] [place], [place] [address]  C. Diff by PCR, Reflexed     Status: Abnormal   Collection Time: 05/11/17 10:30 AM  Result Value Ref Range Status   Toxigenic C. Difficile by PCR POSITIVE (A) NEGATIVE Final    Comment: Positive for toxigenic C. difficile with little to no toxin production. Only treat if clinical presentation suggests symptomatic illness. Performed at [facility], [address] [place], [place] [address]   Gastrointestinal Panel by PCR , Stool     Status: None   Collection Time: 05/15/17  2:41 PM  Result Value Ref Range Status   Campylobacter species NOT DETECTED NOT DETECTED Final   Plesimonas shigelloides NOT DETECTED NOT DETECTED Final   Salmonella species NOT DETECTED NOT DETECTED Final   Yersinia enterocolitica NOT DETECTED NOT DETECTED Final   Vibrio species NOT DETECTED NOT DETECTED Final   Vibrio cholerae NOT DETECTED NOT DETECTED Final   Enteroaggregative E coli (EAEC) NOT DETECTED NOT DETECTED Final   Enteropathogenic E coli (EPEC) NOT DETECTED NOT DETECTED Final   Enterotoxigenic E coli (ETEC) NOT DETECTED NOT DETECTED Final   Shiga like toxin producing E coli (STEC) NOT DETECTED NOT DETECTED Final  Shigella/Enteroinvasive E coli (EIEC) NOT DETECTED NOT DETECTED Final   Cryptosporidium NOT DETECTED NOT DETECTED Final   Cyclospora cayetanensis NOT DETECTED NOT DETECTED Final   Entamoeba histolytica NOT DETECTED NOT DETECTED Final   Giardia lamblia NOT DETECTED NOT DETECTED Final   Adenovirus F40/41 NOT DETECTED NOT DETECTED Final   Astrovirus NOT DETECTED NOT DETECTED Final   Norovirus GI/GII NOT DETECTED NOT DETECTED Final   Rotavirus A NOT DETECTED NOT DETECTED Final   Sapovirus (I, II, IV, and V) NOT DETECTED NOT DETECTED Final    Comment: Performed at [facility], [address], [place],   [address]    Medical History: Past Medical History:  Diagnosis Date  . Allergy   . Anemia    TAKING IRON  . Arthritis    KNEES AND HANDS  . Asthma    COUGHING, NO ATTACKS IN A YEAR OR 2  . Dyspnea    ON EXERTION  . GERD (gastroesophageal reflux disease)   . HOH (hard of hearing)    30-35 % HEARING LOSS  . Hyperlipidemia   . Hypertension    HX OF  . LABD (linear immunoglobulin A bullous dermatosis)   . Mitral valve prolapse   . Mitral valve prolapse   . 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 Parkinson's disease ([facility])   . Rectal bleeding   . Sleep apnea    CPAP  . Vertigo     Medications:  Medications Prior to Admission  Medication Sig Dispense Refill Last Dose  . albuterol (PROVENTIL HFA;VENTOLIN HFA) 108 (90 Base) MCG/ACT inhaler Inhale 1-2 puffs into the lungs every 6 (six) hours as needed for wheezing or shortness of breath. 1 Inhaler 1 PRN at PRN  . Calcium Carbonate-Vit D-Min (CALCIUM 1200 PO) Take by mouth.   06/09/6281 at AM  . folic acid (FOLVITE) 1 MG tablet Take 1 mg by mouth daily.    05/14/2017 at AM  . gabapentin (NEURONTIN) 100 MG capsule Take 100 mg by mouth at bedtime. Dr Sharol Roussel   05/14/2017 at PM  . levocetirizine (XYZAL) 5 MG tablet Take 1 tablet (5 mg total) by mouth as needed. 90 tablet 1 PRN at PRN  . methotrexate 2.5 MG tablet Take 10 mg by mouth once a week. Takes on Tuesdays   05/10/2017 at AM  . Multiple Vitamin (MULTIVITAMIN) capsule Take 1 capsule by mouth daily.   05/14/2017 at AM  . nystatin (MYCOSTATIN) 100000 UNIT/ML suspension Take 5 mLs (500,000 Units total) by mouth 4 (four) times daily. 60 mL 0 PRN at PRN  . omeprazole (PRILOSEC) 20 MG capsule Take 1 capsule (20 mg total) by mouth daily. 90 capsule 3 05/15/2017 at AM  . ondansetron (ZOFRAN ODT) 4 MG disintegrating tablet Take 1 tablet (4 mg total) by mouth every 8 (eight) hours as needed for nausea or vomiting. 20 tablet 0 PRN at PRN  . polyethylene glycol (MIRALAX /  GLYCOLAX) packet Take 17 g by mouth every other day.    PRN at PRN  . potassium chloride SA (K-DUR,KLOR-CON) 20 MEQ tablet Take 1 tablet (20 mEq total) by mouth 2 (two) times daily. 60 tablet 1 05/14/2017 at AM  . predniSONE (DELTASONE) 5 MG tablet Take 10 mg by mouth.    05/14/2017 at AM  . AMBULATORY NON FORMULARY MEDICATION 1 Units by Other route daily. Right AFO 1 Units 0 UTD at UTD  . amoxicillin-clavulanate (AUGMENTIN) 875-125 MG tablet Take 1 tablet  by mouth 2 (two) times daily. (Patient not taking: Reported on 05/15/2017) 20 tablet 0 -- at --  . neomycin-polymyxin-hydrocortisone (CORTISPORIN) 3.5-10000-1 OTIC suspension Place 4 drops into the right ear 3 (three) times daily. (Patient not taking: Reported on 05/15/2017) 10 mL 0 -- at --   Scheduled:  . enoxaparin (LOVENOX) injection  40 mg Subcutaneous Q24H  . folic acid  1 mg Oral Daily  . gabapentin  100 mg Oral QHS  . multivitamin with minerals  1 tablet Oral Daily  . pantoprazole  40 mg Oral Daily  . potassium chloride  20 mEq Oral Once  . predniSONE  10 mg Oral Q breakfast  . vancomycin  125 mg Oral Q6H    Assessment: Pharmacy consulted to manage electrolytes in this 72 year old woman admitted for rectal bleeding and found to have Cdiff.   Goal of Therapy:  K-3.5-5.0; Magnesium: 1.8-2.4; Phos 2.5-4.6  Plan:  Will give KCL po 29mq x 1 dose. Will recheck electrolytes in am.   GDonna ChristenCoffee 05/20/2017,11:37 AM

## 2017-05-20 NOTE — Clinical Social Work Note (Signed)
CSW spoke to patient and presented bed offers to patient and she chose [facility] [place].  [facility]W contacted [facility] and [facility] can accept patient once insurance has been approved.  [facility] will start auth today, CSW continuing to follow patient's progress throughout discharge planning.  Jones Broom. [place], MSW, [place]  05/20/2017 12:27 PM

## 2017-05-21 LAB — BASIC METABOLIC PANEL
ANION GAP: 11 (ref 5–15)
BUN: <5 mg/dL — ABNORMAL LOW (ref 6–20)
CHLORIDE: 104 mmol/L (ref 101–111)
CO2: 23 mmol/L (ref 22–32)
Calcium: 7.3 mg/dL — ABNORMAL LOW (ref 8.9–10.3)
Glucose, Bld: 79 mg/dL (ref 65–99)
POTASSIUM: 3 mmol/L — AB (ref 3.5–5.1)
Sodium: 138 mmol/L (ref 135–145)

## 2017-05-21 LAB — MAGNESIUM: Magnesium: 1.6 mg/dL — ABNORMAL LOW (ref 1.7–2.4)

## 2017-05-21 MED ORDER — LIDOCAINE 5 % EX OINT
TOPICAL_OINTMENT | Freq: Two times a day (BID) | CUTANEOUS | Status: DC | PRN
  Filled 2017-05-21: qty 35.44

## 2017-05-21 MED ORDER — POTASSIUM CHLORIDE CRYS ER 20 MEQ PO TBCR
40.0000 meq | EXTENDED_RELEASE_TABLET | ORAL | Status: AC
  Administered 2017-05-21 (×2): 40 meq via ORAL
  Filled 2017-05-21 (×2): qty 2

## 2017-05-21 MED ORDER — MAGNESIUM SULFATE 4 GM/100ML IV SOLN
4.0000 g | Freq: Once | INTRAVENOUS | Status: AC
Start: 2017-05-21 — End: 2017-05-21
  Administered 2017-05-21: 4 g via INTRAVENOUS
  Filled 2017-05-21: qty 100

## 2017-05-21 NOTE — Progress Notes (Addendum)
[place] at [place] NAME: Lindsay Stephenson    MR#:  [id number]  DATE OF BIRTH:  07-05-1945  SUBJECTIVE:  CHIEF COMPLAINT:   Chief Complaint  Patient presents with  . Rectal Bleeding  . Abdominal Pain  Received some antibiotic for her ear infection 2-3 weeks ago, since then have diarrhea, nausea, not able to eat enough and loss of strength. Came to emergency room last week and at urgent care Center found to have C. difficile so given oral Flagyl. She could not tolerate it and continued to have symptoms so came back and admitted for management of C. difficile with IV antibiotics. She also had some blood in her stool .   For slightly better now, able to tolerate liquids orally , but not much solids. Stool is forming up now, and she is able to hold It until help arrives in room.  REVIEW OF SYSTEMS:  CONSTITUTIONAL: No fever, positive for fatigue or weakness.  EYES: No blurred or double vision.  EARS, NOSE, AND THROAT: No tinnitus or ear pain.  RESPIRATORY: No cough, shortness of breath, wheezing or hemoptysis.  CARDIOVASCULAR: No chest pain, orthopnea, edema.  GASTROINTESTINAL: No nausea, vomiting, positive for diarrhea or abdominal pain.  GENITOURINARY: No dysuria, hematuria.  ENDOCRINE: No polyuria, nocturia,  HEMATOLOGY: No anemia, easy bruising or bleeding SKIN: No rash or lesion. MUSCULOSKELETAL: No joint pain or arthritis.   NEUROLOGIC: No tingling, numbness, weakness.  PSYCHIATRY: No anxiety or depression.   ROS  DRUG ALLERGIES:   Allergies  Allergen Reactions  . Entex Lq [Phenylephrine-Guaifenesin] Swelling    TONGUE  . Codeine Hives and Other (See Comments)    Pt reports only having symptoms after receiving large amounts of medication  . Sulfa Antibiotics Itching  . Tape Other (See Comments)    CAN PULL OFF LAYER OF SKIN    VITALS:  Blood pressure (!) 128/58, pulse 93, temperature 99.2 F (37.3 C), temperature source Oral, resp.  rate 20, height 5' (1.524 m), weight 86.2 kg (190 lb), SpO2 98 %.  PHYSICAL EXAMINATION:  GENERAL:  72 y.o.-year-old patient lying in the bed with no acute distress.  EYES: Pupils equal, round, reactive to light and accommodation. No scleral icterus. Extraocular muscles intact.  HEENT: Head atraumatic, normocephalic. Oropharynx and nasopharynx clear.  NECK:  Supple, no jugular venous distention. No thyroid enlargement, no tenderness.  LUNGS: Normal breath sounds bilaterally, no wheezing, rales,rhonchi or crepitation. No use of accessory muscles of respiration.  CARDIOVASCULAR: S1, S2 normal. No murmurs, rubs, or gallops.  ABDOMEN: Soft, tender, nondistended. Bowel sounds present. No organomegaly or mass.  EXTREMITIES: No pedal edema, cyanosis, or clubbing.  NEUROLOGIC: Cranial nerves II through XII are intact. Muscle strength 4/5 in all extremities. Sensation intact. Gait not checked.  PSYCHIATRIC: The patient is alert and oriented x 3.  SKIN: No obvious rash, lesion, or ulcer.   Physical Exam LABORATORY PANEL:   CBC Recent Labs  Lab 05/20/17 0649  WBC 7.8  HGB 12.9  HCT 38.4  PLT 116*   ------------------------------------------------------------------------------------------------------------------  Chemistries  Recent Labs  Lab 05/15/17 1758  05/21/17 0409  NA 139   < > 138  K 3.2*   < > 3.0*  CL 106   < > 104  CO2 18*   < > 23  GLUCOSE 88   < > 79  BUN <5*   < > <5*  CREATININE 0.56   < > <0.30*  CALCIUM 8.3*   < >  7.3*  MG 1.6*   < > 1.6*  AST 18  --   --   ALT 9*  --   --   ALKPHOS 48  --   --   BILITOT 1.4*  --   --    < > = values in this interval not displayed.   ------------------------------------------------------------------------------------------------------------------  Cardiac Enzymes No results for input(s): TROPONINI in the last 168  hours. ------------------------------------------------------------------------------------------------------------------  RADIOLOGY:  No results found.  ASSESSMENT AND PLAN:   Active Problems:   Clostridium difficile colitis   Protein-calorie malnutrition, severe  * C. difficile colitis Failed outpatient treatment.   continue vancomycin p.o.  Zofran as needed.  IV fluids support.  Appreciated consult by GI, added IV Flagyl.   Will call surgical consult if any worsening in the condition then we have to suspect toxic megacolon.   Still had 4-5  bowel movement last night, continue monitoring.   Overall feeling better. Upgrade die to soft.   Frequency of stool is decreasing.  * Rectal bleeding.  Follow hemoglobin and GI consult. Hemoglobin stable, likely due to C. difficile colitis.   GI suggested to monitor for now.     * Hypokalemia.  Give potassium supplement and follow-up potassium and magnesium level.   Admission is low, replace IV. Replace oral, Improving.  * Hypertension, controlled.   Monitor with IV fluid.   Stable.  * Hypophosphatemia   Replace by pharmacist as needed.  PT suggest SNF now, still awaited insurance approval.    All the records are reviewed and case discussed with Care Management/Social Workerr. Management plans discussed with the patient, family and they are in agreement.  CODE STATUS: Full.  TOTAL TIME TAKING CARE OF THIS PATIENT: 35 minutes.   POSSIBLE D/C IN 1-2 DAYS, DEPENDING ON CLINICAL CONDITION.   Vaughan Basta M.D on 05/21/2017   Between 7am to 6pm - Pager - [phone]  After 6pm go to [email or web address] - password [email or web address] [facility]  Office  [phone]  CC: Primary care physician; Juline Patch, MD  Note: This dictation was prepared with Dragon dictation along with smaller phrase technology. Any transcriptional errors that result from this process are unintentional.

## 2017-05-21 NOTE — Progress Notes (Signed)
[place] at [place] NAME: Lindsay Stephenson    MR#:  [id number]  DATE OF BIRTH:  1945/06/01  SUBJECTIVE:  CHIEF COMPLAINT:   Chief Complaint  Patient presents with  . Rectal Bleeding  . Abdominal Pain  Received some antibiotic for her ear infection 2-3 weeks ago, since then have diarrhea, nausea, not able to eat enough and loss of strength. Came to emergency room last week and at urgent care Center found to have C. difficile so given oral Flagyl. She could not tolerate it and continued to have symptoms so came back and admitted for management of C. difficile with IV antibiotics. She also had some blood in her stool .   For slightly better now, able to tolerate liquids orally some. Still had 4-5 loose bowel movement last night. Less abd pain and nausea. Stool is forming up now.  REVIEW OF SYSTEMS:  CONSTITUTIONAL: No fever, positive for fatigue or weakness.  EYES: No blurred or double vision.  EARS, NOSE, AND THROAT: No tinnitus or ear pain.  RESPIRATORY: No cough, shortness of breath, wheezing or hemoptysis.  CARDIOVASCULAR: No chest pain, orthopnea, edema.  GASTROINTESTINAL: No nausea, vomiting, positive for diarrhea or abdominal pain.  GENITOURINARY: No dysuria, hematuria.  ENDOCRINE: No polyuria, nocturia,  HEMATOLOGY: No anemia, easy bruising or bleeding SKIN: No rash or lesion. MUSCULOSKELETAL: No joint pain or arthritis.   NEUROLOGIC: No tingling, numbness, weakness.  PSYCHIATRY: No anxiety or depression.   ROS  DRUG ALLERGIES:   Allergies  Allergen Reactions  . Entex Lq [Phenylephrine-Guaifenesin] Swelling    TONGUE  . Codeine Hives and Other (See Comments)    Pt reports only having symptoms after receiving large amounts of medication  . Sulfa Antibiotics Itching  . Tape Other (See Comments)    CAN PULL OFF LAYER OF SKIN    VITALS:  Blood pressure (!) 128/58, pulse 93, temperature 99.2 F (37.3 C), temperature source Oral,  resp. rate 20, height 5' (1.524 m), weight 86.2 kg (190 lb), SpO2 98 %.  PHYSICAL EXAMINATION:  GENERAL:  72 y.o.-year-old patient lying in the bed with no acute distress.  EYES: Pupils equal, round, reactive to light and accommodation. No scleral icterus. Extraocular muscles intact.  HEENT: Head atraumatic, normocephalic. Oropharynx and nasopharynx clear.  NECK:  Supple, no jugular venous distention. No thyroid enlargement, no tenderness.  LUNGS: Normal breath sounds bilaterally, no wheezing, rales,rhonchi or crepitation. No use of accessory muscles of respiration.  CARDIOVASCULAR: S1, S2 normal. No murmurs, rubs, or gallops.  ABDOMEN: Soft, tender, nondistended. Bowel sounds present. No organomegaly or mass.  EXTREMITIES: No pedal edema, cyanosis, or clubbing.  NEUROLOGIC: Cranial nerves II through XII are intact. Muscle strength 4/5 in all extremities. Sensation intact. Gait not checked.  PSYCHIATRIC: The patient is alert and oriented x 3.  SKIN: No obvious rash, lesion, or ulcer.   Physical Exam LABORATORY PANEL:   CBC Recent Labs  Lab 05/20/17 0649  WBC 7.8  HGB 12.9  HCT 38.4  PLT 116*   ------------------------------------------------------------------------------------------------------------------  Chemistries  Recent Labs  Lab 05/15/17 1758  05/21/17 0409  NA 139   < > 138  K 3.2*   < > 3.0*  CL 106   < > 104  CO2 18*   < > 23  GLUCOSE 88   < > 79  BUN <5*   < > <5*  CREATININE 0.56   < > <0.30*  CALCIUM 8.3*   < > 7.3*  MG 1.6*   < > 1.6*  AST 18  --   --   ALT 9*  --   --   ALKPHOS 48  --   --   BILITOT 1.4*  --   --    < > = values in this interval not displayed.   ------------------------------------------------------------------------------------------------------------------  Cardiac Enzymes No results for input(s): TROPONINI in the last 168  hours. ------------------------------------------------------------------------------------------------------------------  RADIOLOGY:  No results found.  ASSESSMENT AND PLAN:   Active Problems:   Clostridium difficile colitis   Protein-calorie malnutrition, severe  * C. difficile colitis Failed outpatient treatment.  continue vancomycin p.o. Zofran as needed. IV fluids support. Appreciated consult by GI, added IV Flagyl. Overall feeling better. Upgrade die to soft.   Since last afternoon- have semisolid BM and at less frequency. She is also able to Hold it until getting nursing help.   D/c at rehab and follow with GI , Finish 10 days oral vanc + flagyl. - Still don't have insurance approval for SNF placement.  * Rectal bleeding. Follow hemoglobin and GI consult. Hemoglobin stable, likely due to C. difficile colitis. GI suggested to monitor for now.    Follow at GI clinic as out pt.  * Hypokalemia. Give potassium supplement and follow-up potassium and magnesium level. Admission is low, replace IV. Replace oral, Improving.  * Hypertension, controlled. Monitor with IV fluid. Stable.  * Hypophosphatemia Replace by pharmacist as needed.  * generalized weakness   D.c at rehab.   All the records are reviewed and case discussed with Care Management/Social Workerr. Management plans discussed with the patient, family and they are in agreement.  CODE STATUS: Full.  TOTAL TIME TAKING CARE OF THIS PATIENT: 35 minutes.    POSSIBLE D/C IN 1-2 DAYS, DEPENDING ON CLINICAL CONDITION. Stool is forming up now, need insurance approval for rehab placement.  Vaughan Basta M.D on 05/21/2017   Between 7am to 6pm - Pager - [phone]  After 6pm go to [email or web address] - password [email or web address] [facility]  Office  [phone]  CC: Primary care physician; Juline Patch, MD  Note: This dictation was prepared with Dragon dictation along  with smaller phrase technology. Any transcriptional errors that result from this process are unintentional.

## 2017-05-21 NOTE — Progress Notes (Signed)
MEDICATION RELATED CONSULT NOTE - INITIAL   Pharmacy Consult for electrolyte management  Indication: hypokalemia and hypomagnesia   Allergies  Allergen Reactions  . Entex Lq [Phenylephrine-Guaifenesin] Swelling    TONGUE  . Codeine Hives and Other (See Comments)    Pt reports only having symptoms after receiving large amounts of medication  . Sulfa Antibiotics Itching  . Tape Other (See Comments)    CAN PULL OFF LAYER OF SKIN   Patient Measurements: Height: 5' (152.4 cm) Weight: 190 lb (86.2 kg) IBW/kg (Calculated) : 45.5  Vital Signs: Temp: 99.2 F (37.3 C) (02/16 0615) Temp Source: Oral (02/16 0615) BP: 128/58 (02/16 0615) Pulse Rate: 93 (02/16 0615) Intake/Output from previous day: 02/15 0701 - 02/16 0700 In: 200 [P.O.:200] Out: -  Intake/Output from this shift: Total I/O In: 89 [IV Piggyback:89] Out: -   Labs: Recent Labs    05/19/17 0453 05/20/17 0649 05/21/17 0409  WBC  --  7.8  --   HGB  --  12.9  --   HCT  --  38.4  --   PLT  --  116*  --   CREATININE 0.41* 0.38* <0.30*  MG 1.7 1.5* 1.6*  PHOS 2.2* 2.7  --     Assessment: Pharmacy consulted to manage electrolytes in this 72 year old woman admitted for rectal bleeding and found to have Cdiff.   Goal of Therapy: K-3.5-5.0; Magnesium: 1.8-2.4; Phos 2.5-4.6  Plan:  Mg = 1.6 despite replacement yesterday. Order magnesium 4 g IV once K = 3, will order KCl 40 mEq PO BID  Will recheck electrolytes with AM labs tomorrow.  Lenis Noon, PharmD, BCPS Clinical Pharmacist 05/21/2017,9:03 AM

## 2017-05-22 LAB — MAGNESIUM: Magnesium: 1.8 mg/dL (ref 1.7–2.4)

## 2017-05-22 LAB — CREATININE, SERUM: CREATININE: 0.31 mg/dL — AB (ref 0.44–1.00)

## 2017-05-22 LAB — POTASSIUM: Potassium: 3.6 mmol/L (ref 3.5–5.1)

## 2017-05-22 LAB — PHOSPHORUS: PHOSPHORUS: 2.4 mg/dL — AB (ref 2.5–4.6)

## 2017-05-22 MED ORDER — K PHOS MONO-SOD PHOS DI & MONO 155-852-130 MG PO TABS
500.0000 mg | ORAL_TABLET | ORAL | Status: AC
  Administered 2017-05-22 (×2): 500 mg via ORAL
  Filled 2017-05-22 (×2): qty 2

## 2017-05-22 MED ORDER — MAGNESIUM OXIDE 400 (241.3 MG) MG PO TABS
400.0000 mg | ORAL_TABLET | Freq: Every day | ORAL | Status: DC
  Administered 2017-05-22 – 2017-05-24 (×3): 400 mg via ORAL
  Filled 2017-05-22 (×3): qty 1

## 2017-05-22 MED ORDER — PSYLLIUM 95 % PO PACK
1.0000 | PACK | Freq: Two times a day (BID) | ORAL | Status: DC
  Administered 2017-05-22 – 2017-05-23 (×2): 1 via ORAL
  Filled 2017-05-22 (×6): qty 1

## 2017-05-22 NOTE — Progress Notes (Signed)
MEDICATION RELATED CONSULT NOTE - INITIAL   Pharmacy Consult for electrolyte management  Indication: hypokalemia and hypomagnesia   Allergies  Allergen Reactions  . Entex Lq [Phenylephrine-Guaifenesin] Swelling    TONGUE  . Codeine Hives and Other (See Comments)    Pt reports only having symptoms after receiving large amounts of medication  . Sulfa Antibiotics Itching  . Tape Other (See Comments)    CAN PULL OFF LAYER OF SKIN   Patient Measurements: Height: 5' (152.4 cm) Weight: 190 lb (86.2 kg) IBW/kg (Calculated) : 45.5  Vital Signs: BP: 115/57 (02/17 0611) Pulse Rate: 98 (02/17 0611) Intake/Output from previous day: 02/16 0701 - 02/17 0700 In: 439 [P.O.:120; IV Piggyback:319] Out: -  Intake/Output from this shift: No intake/output data recorded.  Labs: Recent Labs    05/20/17 0649 05/21/17 0409 05/22/17 0451  WBC 7.8  --   --   HGB 12.9  --   --   HCT 38.4  --   --   PLT 116*  --   --   CREATININE 0.38* <0.30* 0.31*  MG 1.5* 1.6* 1.8  PHOS 2.7  --  2.4*    Assessment: Pharmacy consulted to manage electrolytes in this 72 year old woman admitted for rectal bleeding and found to have Cdiff.   Goal of Therapy: K-3.5-5.0; Magnesium: 1.8-2.4; Phos 2.5-4.6  Plan:  Mg = 1.8 will start mag-ox 400 mg PO daily K = 3.6 WNL, no replacement necessary Phos = 2.4, order K-phos neutra 500 mg PO every 4 hours x 2 doses  Will recheck electrolytes with AM labs tomorrow.  Lenis Noon, PharmD, BCPS Clinical Pharmacist 05/22/2017,9:28 AM

## 2017-05-22 NOTE — Progress Notes (Signed)
[facility] [place] NAME: Lindsay Stephenson    MR#:  [id number]  DATE OF BIRTH:  28-Jan-1946  SUBJECTIVE:  CHIEF COMPLAINT:   Chief Complaint  Patient presents with  . Rectal Bleeding  . Abdominal Pain  Received some antibiotic for her ear infection 2-3 weeks ago, since then have diarrhea, nausea, not able to eat enough and loss of strength. Came to emergency room last week and at urgent care Center found to have C. difficile so given oral Flagyl. She could not tolerate it and continued to have symptoms so came back and admitted for management of C. difficile with IV antibiotics. She also had some blood in her stool .   For slightly better now, able to tolerate diet. Stool is forming up now, and she is able to hold It until help arrives in room. Decreasing frequency.  REVIEW OF SYSTEMS:  CONSTITUTIONAL: No fever, positive for fatigue or weakness.  EYES: No blurred or double vision.  EARS, NOSE, AND THROAT: No tinnitus or ear pain.  RESPIRATORY: No cough, shortness of breath, wheezing or hemoptysis.  CARDIOVASCULAR: No chest pain, orthopnea, edema.  GASTROINTESTINAL: No nausea, vomiting, positive for diarrhea or abdominal pain.  GENITOURINARY: No dysuria, hematuria.  ENDOCRINE: No polyuria, nocturia,  HEMATOLOGY: No anemia, easy bruising or bleeding SKIN: No rash or lesion. MUSCULOSKELETAL: No joint pain or arthritis.   NEUROLOGIC: No tingling, numbness, weakness.  PSYCHIATRY: No anxiety or depression.   ROS  DRUG ALLERGIES:   Allergies  Allergen Reactions  . Entex Lq [Phenylephrine-Guaifenesin] Swelling    TONGUE  . Codeine Hives and Other (See Comments)    Pt reports only having symptoms after receiving large amounts of medication  . Sulfa Antibiotics Itching  . Tape Other (See Comments)    CAN PULL OFF LAYER OF SKIN    VITALS:  Blood pressure 125/66, pulse 96, temperature 98.4 F (36.9 C), resp. rate 16, height 5' (1.524 m), weight  86.2 kg (190 lb), SpO2 95 %.  PHYSICAL EXAMINATION:  GENERAL:  72 y.o.-year-old patient lying in the bed with no acute distress.  EYES: Pupils equal, round, reactive to light and accommodation. No scleral icterus. Extraocular muscles intact.  HEENT: Head atraumatic, normocephalic. Oropharynx and nasopharynx clear.  NECK:  Supple, no jugular venous distention. No thyroid enlargement, no tenderness.  LUNGS: Normal breath sounds bilaterally, no wheezing, rales,rhonchi or crepitation. No use of accessory muscles of respiration.  CARDIOVASCULAR: S1, S2 normal. No murmurs, rubs, or gallops.  ABDOMEN: Soft, tender, nondistended. Bowel sounds present. No organomegaly or mass.  EXTREMITIES: No pedal edema, cyanosis, or clubbing.  NEUROLOGIC: Cranial nerves II through XII are intact. Muscle strength 4/5 in all extremities. Sensation intact. Gait not checked.  PSYCHIATRIC: The patient is alert and oriented x 3.  SKIN: No obvious rash, lesion, or ulcer.   Physical Exam LABORATORY PANEL:   CBC Recent Labs  Lab 05/20/17 0649  WBC 7.8  HGB 12.9  HCT 38.4  PLT 116*   ------------------------------------------------------------------------------------------------------------------  Chemistries  Recent Labs  Lab 05/15/17 1758  05/21/17 0409 05/22/17 0451  NA 139   < > 138  --   K 3.2*   < > 3.0* 3.6  CL 106   < > 104  --   CO2 18*   < > 23  --   GLUCOSE 88   < > 79  --   BUN <5*   < > <5*  --   CREATININE 0.56   < > <  0.30* 0.31*  CALCIUM 8.3*   < > 7.3*  --   MG 1.6*   < > 1.6* 1.8  AST 18  --   --   --   ALT 9*  --   --   --   ALKPHOS 48  --   --   --   BILITOT 1.4*  --   --   --    < > = values in this interval not displayed.   ------------------------------------------------------------------------------------------------------------------  Cardiac Enzymes No results for input(s): TROPONINI in the last 168  hours. ------------------------------------------------------------------------------------------------------------------  RADIOLOGY:  No results found.  ASSESSMENT AND PLAN:   Active Problems:   Clostridium difficile colitis   Protein-calorie malnutrition, severe  * C. difficile colitis Failed outpatient treatment.   continue vancomycin p.o.  Zofran as needed.  IV fluids support.  Appreciated consult by GI, added IV Flagyl.   Will call surgical consult if any worsening in the condition then we have to suspect toxic megacolon.   Still had 4-5  bowel movement last night, continue monitoring.   Overall feeling better. Upgrade die to soft.   Frequency of stool is decreasing.   Will add dietary fiber to help with loose stools.  * Rectal bleeding.  Follow hemoglobin and GI consult. Hemoglobin stable, likely due to C. difficile colitis.   GI suggested to monitor for now.     * Hypokalemia.  Give potassium supplement and follow-up potassium and magnesium level.   Admission is low, replace IV. Replace oral, Improving.  * Hypertension, controlled.   Monitor with IV fluid.   Stable.  * Hypophosphatemia   Replace by pharmacist as needed.  PT suggest SNF now, still awaited insurance approval.    All the records are reviewed and case discussed with Care Management/Social Workerr. Management plans discussed with the patient, family and they are in agreement.  CODE STATUS: Full.  TOTAL TIME TAKING CARE OF THIS PATIENT: 35 minutes.   POSSIBLE D/C IN 1-2 DAYS, DEPENDING ON CLINICAL CONDITION.   Vaughan Basta M.D on 05/22/2017   Between 7am to 6pm - Pager - [phone]  After 6pm go to [email or web address] - password [email or web address] [facility]  Office  [phone]  CC: Primary care physician; Juline Patch, MD  Note: This dictation was prepared with Dragon dictation along with smaller phrase technology. Any transcriptional errors that result from this  process are unintentional.

## 2017-05-23 LAB — CBC
HEMATOCRIT: 36.1 % (ref 35.0–47.0)
HEMOGLOBIN: 12.3 g/dL (ref 12.0–16.0)
MCH: 31.9 pg (ref 26.0–34.0)
MCHC: 34.1 g/dL (ref 32.0–36.0)
MCV: 93.8 fL (ref 80.0–100.0)
Platelets: 62 10*3/uL — ABNORMAL LOW (ref 150–440)
RBC: 3.85 MIL/uL (ref 3.80–5.20)
RDW: 16 % — AB (ref 11.5–14.5)
WBC: 7.1 10*3/uL (ref 3.6–11.0)

## 2017-05-23 LAB — BASIC METABOLIC PANEL
ANION GAP: 9 (ref 5–15)
BUN: 7 mg/dL (ref 6–20)
CALCIUM: 7.1 mg/dL — AB (ref 8.9–10.3)
CO2: 25 mmol/L (ref 22–32)
CREATININE: 0.35 mg/dL — AB (ref 0.44–1.00)
Chloride: 103 mmol/L (ref 101–111)
Glucose, Bld: 82 mg/dL (ref 65–99)
Potassium: 3.1 mmol/L — ABNORMAL LOW (ref 3.5–5.1)
SODIUM: 137 mmol/L (ref 135–145)

## 2017-05-23 LAB — MAGNESIUM: MAGNESIUM: 1.5 mg/dL — AB (ref 1.7–2.4)

## 2017-05-23 LAB — PHOSPHORUS: Phosphorus: 3.2 mg/dL (ref 2.5–4.6)

## 2017-05-23 MED ORDER — MAGNESIUM SULFATE 2 GM/50ML IV SOLN
2.0000 g | Freq: Once | INTRAVENOUS | Status: AC
  Administered 2017-05-23: 2 g via INTRAVENOUS

## 2017-05-23 MED ORDER — MAGNESIUM SULFATE 2 GM/50ML IV SOLN
2.0000 g | Freq: Once | INTRAVENOUS | Status: DC
  Filled 2017-05-23: qty 50

## 2017-05-23 MED ORDER — POTASSIUM CHLORIDE 10 MEQ/100ML IV SOLN
10.0000 meq | INTRAVENOUS | Status: AC
  Administered 2017-05-23 (×3): 10 meq via INTRAVENOUS
  Filled 2017-05-23: qty 100

## 2017-05-23 NOTE — Progress Notes (Signed)
PT Cancellation Note  Patient Details Name: Lindsay Stephenson MRN: [id number] DOB: July 01, 1945   Cancelled Treatment:    Reason Eval/Treat Not Completed: Fatigue/lethargy limiting ability to participate. Treatment attempted; pt refuses due to having a difficult day and being too fatigued. Re attempt tomorrow.   Larae Grooms, PTA 05/23/2017, 3:27 PM

## 2017-05-23 NOTE — Care Management Important Message (Signed)
Important Message  Patient Details  Name: Lindsay Stephenson MRN: [id number] Date of Birth: 05-01-1945   Medicare Important Message Given:  Yes    Beverly Sessions, RN 05/23/2017, 2:45 PM

## 2017-05-23 NOTE — Progress Notes (Signed)
Pharmacy consulted for electrolyte replacement protocol:   Goal of therapy: Electrolytes within normal limits:  K 3.5 - 5.1 Corrected Ca 8.9 - 10.3 Phos 2.5 - 4.6 Mg 1.7 - 2.4   Assessment: Lab Results  Component Value Date   CREATININE 0.35 (L) 05/23/2017   BUN 7 05/23/2017   NA 137 05/23/2017   K 3.1 (L) 05/23/2017   CL 103 05/23/2017   CO2 25 05/23/2017  Mg 1.5  Phos 3.2    Plan: KCl 75mq iv q1h x 3 doses and Magnesium sulfate 2gm iv x 1. Follow up with AM labs tomorrow per protocol   GThomasenia Sales PharmD, MBA, [facility]

## 2017-05-24 ENCOUNTER — Telehealth: Payer: Self-pay

## 2017-05-24 LAB — CBC
HCT: 35.9 % (ref 35.0–47.0)
Hemoglobin: 12.1 g/dL (ref 12.0–16.0)
MCH: 31.8 pg (ref 26.0–34.0)
MCHC: 33.7 g/dL (ref 32.0–36.0)
MCV: 94.4 fL (ref 80.0–100.0)
PLATELETS: 61 10*3/uL — AB (ref 150–440)
RBC: 3.8 MIL/uL (ref 3.80–5.20)
RDW: 16.1 % — AB (ref 11.5–14.5)
WBC: 8.3 10*3/uL (ref 3.6–11.0)

## 2017-05-24 LAB — BASIC METABOLIC PANEL
ANION GAP: 8 (ref 5–15)
BUN: 8 mg/dL (ref 6–20)
CALCIUM: 7 mg/dL — AB (ref 8.9–10.3)
CO2: 25 mmol/L (ref 22–32)
Chloride: 102 mmol/L (ref 101–111)
Creatinine, Ser: <0.3 mg/dL — ABNORMAL LOW (ref 0.44–1.00)
GLUCOSE: 80 mg/dL (ref 65–99)
POTASSIUM: 3.2 mmol/L — AB (ref 3.5–5.1)
Sodium: 135 mmol/L (ref 135–145)

## 2017-05-24 LAB — PHOSPHORUS: PHOSPHORUS: 2.6 mg/dL (ref 2.5–4.6)

## 2017-05-24 LAB — MAGNESIUM: Magnesium: 1.7 mg/dL (ref 1.7–2.4)

## 2017-05-24 MED ORDER — LIDOCAINE 5 % EX OINT: TOPICAL_OINTMENT | Freq: Two times a day (BID) | CUTANEOUS | 0 refills | Status: DC | PRN

## 2017-05-24 MED ORDER — POTASSIUM CHLORIDE CRYS ER 20 MEQ PO TBCR
40.0000 meq | EXTENDED_RELEASE_TABLET | Freq: Once | ORAL | Status: AC
  Administered 2017-05-24: 40 meq via ORAL
  Filled 2017-05-24: qty 2

## 2017-05-24 MED ORDER — METRONIDAZOLE 500 MG PO TABS: 500.0000 mg | ORAL_TABLET | Freq: Three times a day (TID) | ORAL | 0 refills | Status: AC

## 2017-05-24 MED ORDER — VANCOMYCIN 50 MG/ML ORAL SOLUTION: 125.0000 mg | Freq: Four times a day (QID) | ORAL | 0 refills | Status: AC

## 2017-05-24 MED ORDER — GERHARDT'S BUTT CREAM: 1.0000 "application " | TOPICAL_CREAM | Freq: Every day | CUTANEOUS | 0 refills | Status: DC

## 2017-05-24 MED ORDER — PSYLLIUM 95 % PO PACK: 1.0000 | PACK | Freq: Two times a day (BID) | ORAL | 0 refills | Status: AC

## 2017-05-24 MED ORDER — MAGNESIUM OXIDE 400 (241.3 MG) MG PO TABS: 400.0000 mg | ORAL_TABLET | Freq: Every day | ORAL | 0 refills | Status: DC

## 2017-05-24 NOTE — Progress Notes (Addendum)
Pharmacy consulted for electrolyte replacement protocol:   Goal of therapy: Electrolytes within normal limits:  K 3.5 - 5.1 Corrected Ca 8.9 - 10.3 Phos 2.5 - 4.6 Mg 1.7 - 2.4   Assessment: Lab Results  Component Value Date   CREATININE <0.30 (L) 05/24/2017   BUN 8 05/24/2017   NA 135 05/24/2017   K 3.2 (L) 05/24/2017   CL 102 05/24/2017   CO2 25 05/24/2017  Mg 1.7 Phos 2.6    Plan: Dr Molli Hazard replaced with potassium 34mq po x 1. Follow up with AM labs tomorrow per protocol   GThomasenia Sales PharmD, MBA, [facility]

## 2017-05-24 NOTE — Progress Notes (Signed)
Physical Therapy Treatment Patient Details Name: Lindsay Stephenson MRN: [id number] DOB: 1945/04/13 Today's Date: 05/24/2017    History of Present Illness Pt admitted for Cdiff. Pt complains of rectal bleeding and abdominal pain and complaints of diarrhea x 7-10 days. History includes GERD, HTN, and mitral valve prolapse. K+ improved this date.    PT Comments    Care Manager in during session.  Stated due to issues with insurance authorization it is likely that pt will discharge home vs SNF and recommendations are needed for home discharge.  Pt motivated to participate and voiced being frustrated with insurance company.  Encouragement given.  Participated in exercises as described below.  To edge of bed with min guard, increased time and heavy use of rail.  Once seated EOB she was able to remain sitting without support or LOB.  Stood with min guard/assist x 2 for safety.  She was able to ambulate a short distance in room forward and backwards before fatigue.  She did not have any LOB or bucking but was fatigued and needed to rest.  Discussed home situation with pt.  She does have a rollator walker at home.  Stated he has a light weight and a bariatric one at home which provides more support.  She stated she feels comfortable with them and has demonstrated good safety with the lightweight one here.  Recommended heavier bariatric walker at home for support/safety.  Pt will need a wheelchair at home as she is unable to ambulate household distances at this time (limited to 10').  Pt would also benefit from a 3-in 1 commode if she does not have one currently.  Recommended bed rail and gait belt also for safety and ease of mobility. HHPT and HHOT if available would also be beneficial for pt to return to baseline.   Patient suffers from general weakness which impairs his/her ability to perform daily activities including household mobility, access in and out of home and general ADL activities in the home. A  walker will not resolve the patient's issue with performing activities of daily living. A wheelchair is required/recommended and will allow patient to safely perform daily activities.   Patient can safely propel the wheelchair in the home or has a caregiver who can provide assistance.  While SNF would be the most appropriate discharge disposition, the above recommendations are if pt needs to be discharged home. She will require +1-+2 assist at all times for mobility.       Follow Up Recommendations  SNF     Equipment Recommendations  3in1 (PT);Wheelchair (measurements PT);Other (comment)    Recommendations for Other Services       Precautions / Restrictions Precautions Precautions: Fall Restrictions Weight Bearing Restrictions: No    Mobility  Bed Mobility Overal bed mobility: Needs Assistance Bed Mobility: Supine to Sit     Supine to sit: Min guard     General bed mobility comments: increased time with heavy use of rail  Transfers Overall transfer level: Needs assistance Equipment used: 4-wheeled walker Transfers: Sit to/from Stand Sit to Stand: Min guard;Min assist;+2 safety/equipment            Ambulation/Gait Ambulation/Gait assistance: Min guard;Min assist;+2 safety/equipment Ambulation Distance (Feet): 10 Feet Assistive device: 4-wheeled walker Gait Pattern/deviations: Step-to pattern;Decreased step length - right;Decreased step length - left;Shuffle   Gait velocity interpretation: <1.8 ft/sec, indicative of risk for recurrent falls General Gait Details: slow gait, fatigues quickly, some increased confidence today.   Stairs  Wheelchair Mobility    Modified Rankin (Stroke Patients Only)       Balance Overall balance assessment: Needs assistance Sitting-balance support: Feet supported Sitting balance-Leahy Scale: Good     Standing balance support: Bilateral upper extremity supported Standing balance-Leahy Scale: Fair                               Cognition Arousal/Alertness: Awake/alert Behavior During Therapy: WFL for tasks assessed/performed Overall Cognitive Status: Within Functional Limits for tasks assessed                                        Exercises Other Exercises Other Exercises: BLE ankle pumps, SLR and heel slides  x 10    General Comments        Pertinent Vitals/Pain Pain Assessment: No/denies pain    Home Living                      Prior Function            PT Goals (current goals can now be found in the care plan section) Progress towards PT goals: Progressing toward goals    Frequency    Min 2X/week      PT Plan Current plan remains appropriate    Co-evaluation              AM-PAC PT "6 Clicks" Daily Activity  Outcome Measure  Difficulty turning over in bed (including adjusting bedclothes, sheets and blankets)?: A Little Difficulty moving from lying on back to sitting on the side of the bed? : A Little Difficulty sitting down on and standing up from a chair with arms (e.g., wheelchair, bedside commode, etc,.)?: Unable Help needed moving to and from a bed to chair (including a wheelchair)?: A Little Help needed walking in hospital room?: A Lot Help needed climbing 3-5 steps with a railing? : Total 6 Click Score: 13    End of Session Equipment Utilized During Treatment: Gait belt Activity Tolerance: Patient tolerated treatment well;Patient limited by fatigue Patient left: in chair;with chair alarm set;with call bell/phone within reach Nurse Communication: Mobility status       Time: 3976-7341 PT Time Calculation (min) (ACUTE ONLY): 42 min  Charges:  $Gait Training: 8-22 mins $Therapeutic Exercise: 8-22 mins $Therapeutic Activity: 8-22 mins                    G Codes:       Chesley Noon, PTA 05/24/17, 10:44 AM

## 2017-05-24 NOTE — Telephone Encounter (Signed)
Patients husband called upset that they were D/C from [facility] after 9 days admitted with C Diff. He states he is not able to even get her in a bed. He said rehab was ordered at [facility] and then [facility] took to long to approve so isolation room went to someone else and they were asked to leave hospital. He called 911 when he could not move her to the house and is planning to go to [facility] to see about help. He can not speak to [facility] as he did not give [employer] copy of his POA. I called [facility] and spoke to charge nurse in ER and alerted her and she is prepared to contact [facility] or DSS if need to once patient shows up.

## 2017-05-24 NOTE — Progress Notes (Signed)
Pt discharged per MD order. Discharge instructions reviewed with pt and husband. Husband very disgruntled during discharge. Prescriptions given to pt. IV removed. Pt opted to use wheelchair that was given to pt to be taken downstairs.

## 2017-05-24 NOTE — Clinical Social Work Note (Signed)
[facility] has informed CSW that because [facility] has taken so long and they still have not given [facility] a disposition, that the bed has to be given to a returning patient who needs an isolation room. CSW informed patient this morning and informed her that due to her being ready for discharge yesterday, that the RN CM would need to make arrangements for home with home health. Shela Leff MSW,LCSW [phone]

## 2017-05-24 NOTE — Clinical Social Work Note (Signed)
CSW has noted that patient's husband contacted [facility] to inform that patient was discharged from the hospital due to patient not receiving medicare aetna auth for SNF and that he could not get his wife into a bed so he called 911 and was taking patient to [facility].   Patient's husband was very angry when CSW had to explain that patient was medically stable for discharge yesterday and that [facility] had not yet provided any disposition for whether or not they were going to approve patient to go to rehab. In addition CSW also had to inform patient and husband that CSW received call today from [place] at [facility] and not only had they not received a disposition from [facility], they no longer had any beds for any new patient's because they had to make room for their existing patients returning on isolation. Patient's husband being upset resulted in patient becoming upset and crying. Patient's husband could not understand that regardless of [facility] auth or no auth, patient would not be able to go to [facility] now due to no bed availability. CSW advised that a new bed search could be made but that because patient was medically stable for discharge yesterday, she would need to expect to pay privately at the hospital due to no longer medically needing to be in hospital. Patient and husband declined this.  Shela Leff MSW,LCSW [phone]

## 2017-05-24 NOTE — Care Management (Signed)
Insurance Josem Kaufmann has not yet been obtained for SNF, and patient is medically cleared for discharge.  Patient will return home with home health services.  Home Health orders have been written for RN, PT, aide and a standard WC.  Husband to transport at discharge.  WC delivered to room by Corene Cornea from [facility].  Home health agency preference provided.  Patient does not have agency preference. Referral made to [facility] with [facility]. Start of care for 05/25/17.  RNCM signing off.

## 2017-05-24 NOTE — Discharge Summary (Signed)
[place] at [place] NAME: Lindsay Stephenson    MR#:  [id number]  DATE OF BIRTH:  11-11-1945  DATE OF ADMISSION:  05/15/2017 ADMITTING PHYSICIAN: Demetrios Loll, MD  DATE OF DISCHARGE: 05/24/2017  PRIMARY CARE PHYSICIAN: Juline Patch, MD    ADMISSION DIAGNOSIS:  C. difficile colitis [A04.72]  DISCHARGE DIAGNOSIS:  Active Problems:   Clostridium difficile colitis   Protein-calorie malnutrition, severe   SECONDARY DIAGNOSIS:   Past Medical History:  Diagnosis Date  . Allergy   . Anemia    TAKING IRON  . Arthritis    KNEES AND HANDS  . Asthma    COUGHING, NO ATTACKS IN A YEAR OR 2  . Dyspnea    ON EXERTION  . GERD (gastroesophageal reflux disease)   . HOH (hard of hearing)    30-35 % HEARING LOSS  . Hyperlipidemia   . Hypertension    HX OF  . LABD (linear immunoglobulin A bullous dermatosis)   . Mitral valve prolapse   . Mitral valve prolapse   . 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 Parkinson's disease ([place])   . Rectal bleeding   . Sleep apnea    CPAP  . Vertigo     HOSPITAL COURSE:   * C. difficile colitis Failed outpatient treatment.   continue vancomycin p.o. Zofran as needed. IV fluids support.  Appreciated consult by GI, added IV Flagyl.   Overall feeling better. Upgraded die to soft.   Stool frequency decreased and have soft stool now.   Was waiting for insurance approval, but so far no answers, so will d/c home with home health.  Will give 8 more days of oral treatment.  * Rectal bleeding. Follow hemoglobin and GI consult. Hemoglobin stable, likely due to C. difficile colitis.   GI suggested to monitor for now.    Follow at GI clinic as out pt.    * Hypokalemia. Give potassium supplement and follow-up potassium and magnesium level.   Admission is low, replace IV. Replace oral, Improving.   * Hypertension, controlled.   Monitor with IV fluid.   Stable.  *  Hypophosphatemia   Replace by pharmacist as needed.  * generalized weakness    Home with home health.   DISCHARGE CONDITIONS:   Stable.  CONSULTS OBTAINED:    DRUG ALLERGIES:   Allergies  Allergen Reactions  . Entex Lq [Phenylephrine-Guaifenesin] Swelling    TONGUE  . Codeine Hives and Other (See Comments)    Pt reports only having symptoms after receiving large amounts of medication  . Sulfa Antibiotics Itching  . Tape Other (See Comments)    CAN PULL OFF LAYER OF SKIN    DISCHARGE MEDICATIONS:   Allergies as of 05/24/2017      Reactions   Entex Lq [phenylephrine-guaifenesin] Swelling   TONGUE   Codeine Hives, Other (See Comments)   Pt reports only having symptoms after receiving large amounts of medication   Sulfa Antibiotics Itching   Tape Other (See Comments)   CAN PULL OFF LAYER OF SKIN      Medication List    STOP taking these medications   AMBULATORY NON FORMULARY MEDICATION   amoxicillin-clavulanate 875-125 MG tablet Commonly known as:  AUGMENTIN   neomycin-polymyxin-hydrocortisone 3.5-10000-1 OTIC suspension Commonly known as:  CORTISPORIN   nystatin 100000 UNIT/ML suspension Commonly known as:  MYCOSTATIN   polyethylene glycol packet Commonly known as:  MIRALAX / GLYCOLAX  TAKE these medications   albuterol 108 (90 Base) MCG/ACT inhaler Commonly known as:  PROVENTIL HFA;VENTOLIN HFA Inhale 1-2 puffs into the lungs every 6 (six) hours as needed for wheezing or shortness of breath.   CALCIUM 1200 PO Take by mouth.   folic acid 1 MG tablet Commonly known as:  FOLVITE Take 1 mg by mouth daily.   gabapentin 100 MG capsule Commonly known as:  NEURONTIN Take 100 mg by mouth at bedtime. Dr Sharol Roussel   Gerhardt's butt cream Crea Apply 1 application topically daily. Start taking on:  05/25/2017   levocetirizine 5 MG tablet Commonly known as:  XYZAL Take 1 tablet (5 mg total) by mouth as needed.   lidocaine 5 % ointment Commonly known  as:  XYLOCAINE Apply topically 2 (two) times daily as needed for moderate pain.   magnesium oxide 400 (241.3 Mg) MG tablet Commonly known as:  MAG-OX Take 1 tablet (400 mg total) by mouth daily.   methotrexate 2.5 MG tablet Take 10 mg by mouth once a week. Takes on Tuesdays   metroNIDAZOLE 500 MG tablet Commonly known as:  FLAGYL Take 1 tablet (500 mg total) by mouth 3 (three) times daily for 8 days.   multivitamin capsule Take 1 capsule by mouth daily.   omeprazole 20 MG capsule Commonly known as:  PRILOSEC Take 1 capsule (20 mg total) by mouth daily.   ondansetron 4 MG disintegrating tablet Commonly known as:  ZOFRAN ODT Take 1 tablet (4 mg total) by mouth every 8 (eight) hours as needed for nausea or vomiting.   potassium chloride SA 20 MEQ tablet Commonly known as:  K-DUR,KLOR-CON Take 1 tablet (20 mEq total) by mouth 2 (two) times daily.   predniSONE 5 MG tablet Commonly known as:  DELTASONE Take 10 mg by mouth.   psyllium 95 % Pack Commonly known as:  HYDROCIL/METAMUCIL Take 1 packet by mouth 2 (two) times daily for 10 days.   vancomycin 50 mg/mL oral solution Commonly known as:  VANCOCIN Take 2.5 mLs (125 mg total) by mouth every 6 (six) hours for 8 days.        DISCHARGE INSTRUCTIONS:    Follow with GI clinic in 2 weeks.  If you experience worsening of your admission symptoms, develop shortness of breath, life threatening emergency, suicidal or homicidal thoughts you must seek medical attention immediately by calling 911 or calling your MD immediately  if symptoms less severe.  You Must read complete instructions/literature along with all the possible adverse reactions/side effects for all the Medicines you take and that have been prescribed to you. Take any new Medicines after you have completely understood and accept all the possible adverse reactions/side effects.   Please note  You were cared for by a hospitalist during your hospital stay. If you  have any questions about your discharge medications or the care you received while you were in the hospital after you are discharged, you can call the unit and asked to speak with the hospitalist on call if the hospitalist that took care of you is not available. Once you are discharged, your primary care physician will handle any further medical issues. Please note that NO REFILLS for any discharge medications will be authorized once you are discharged, as it is imperative that you return to your primary care physician (or establish a relationship with a primary care physician if you do not have one) for your aftercare needs so that they can reassess your need for medications and monitor  your lab values.    Today   CHIEF COMPLAINT:   Chief Complaint  Patient presents with  . Rectal Bleeding  . Abdominal Pain    HISTORY OF PRESENT ILLNESS:  Lindsay Stephenson  is a 73 y.o. female with a known history of multiple medical problems as below.  Patient has a persistent diarrhea for the past 7-10 days.  She was diagnosed with C. difficile colitis and given p.o. Flagyl as outpatient.  Her symptoms has been worsening for the past 4 days.  She complains of abdominal cramp, which is intermittent, 8 out of 10 without radiation.  She also complains of rectal bleeding, nausea and diarrhea but no vomiting.  The CAT scan of the abdomen was negative for colitis several days ago.  But the CAT scan of the abdomen today show Inflammatory or infectious colitis as opposed to diverticulitis is suspected.   VITAL SIGNS:  Blood pressure (!) 147/75, pulse (!) 109, temperature 98.5 F (36.9 C), resp. rate 20, height 5' (1.524 m), weight 86.2 kg (190 lb), SpO2 98 %.  I/O:    Intake/Output Summary (Last 24 hours) at 05/24/2017 1126 Last data filed at 05/23/2017 1900 Gross per 24 hour  Intake 208.47 ml  Output 0 ml  Net 208.47 ml    PHYSICAL EXAMINATION:   GENERAL:  72 y.o.-year-old patient lying in the bed with no  acute distress.  EYES: Pupils equal, round, reactive to light and accommodation. No scleral icterus. Extraocular muscles intact.  HEENT: Head atraumatic, normocephalic. Oropharynx and nasopharynx clear.  NECK:  Supple, no jugular venous distention. No thyroid enlargement, no tenderness.  LUNGS: Normal breath sounds bilaterally, no wheezing, rales,rhonchi or crepitation. No use of accessory muscles of respiration.  CARDIOVASCULAR: S1, S2 normal. No murmurs, rubs, or gallops.  ABDOMEN: Soft, tender, nondistended. Bowel sounds present. No organomegaly or mass.  EXTREMITIES: No pedal edema, cyanosis, or clubbing.  NEUROLOGIC: Cranial nerves II through XII are intact. Muscle strength 4/5 in all extremities. Sensation intact. Gait not checked.  PSYCHIATRIC: The patient is alert and oriented x 3.  SKIN: No obvious rash, lesion, or ulcer.    DATA REVIEW:   CBC Recent Labs  Lab 05/24/17 0820  WBC 8.3  HGB 12.1  HCT 35.9  PLT 61*    Chemistries  Recent Labs  Lab 05/24/17 0543  NA 135  K 3.2*  CL 102  CO2 25  GLUCOSE 80  BUN 8  CREATININE <0.30*  CALCIUM 7.0*  MG 1.7    Cardiac Enzymes No results for input(s): TROPONINI in the last 168 hours.  Microbiology Results  Results for orders placed or performed during the hospital encounter of 05/15/17  Gastrointestinal Panel by PCR , Stool     Status: None   Collection Time: 05/15/17  2:41 PM  Result Value Ref Range Status   Campylobacter species NOT DETECTED NOT DETECTED Final   Plesimonas shigelloides NOT DETECTED NOT DETECTED Final   Salmonella species NOT DETECTED NOT DETECTED Final   Yersinia enterocolitica NOT DETECTED NOT DETECTED Final   Vibrio species NOT DETECTED NOT DETECTED Final   Vibrio cholerae NOT DETECTED NOT DETECTED Final   Enteroaggregative E coli (EAEC) NOT DETECTED NOT DETECTED Final   Enteropathogenic E coli (EPEC) NOT DETECTED NOT DETECTED Final   Enterotoxigenic E coli (ETEC) NOT DETECTED NOT DETECTED  Final   Shiga like toxin producing E coli (STEC) NOT DETECTED NOT DETECTED Final   Shigella/Enteroinvasive E coli (EIEC) NOT DETECTED NOT DETECTED Final  Cryptosporidium NOT DETECTED NOT DETECTED Final   Cyclospora cayetanensis NOT DETECTED NOT DETECTED Final   Entamoeba histolytica NOT DETECTED NOT DETECTED Final   Giardia lamblia NOT DETECTED NOT DETECTED Final   Adenovirus F40/41 NOT DETECTED NOT DETECTED Final   Astrovirus NOT DETECTED NOT DETECTED Final   Norovirus GI/GII NOT DETECTED NOT DETECTED Final   Rotavirus A NOT DETECTED NOT DETECTED Final   Sapovirus (I, II, IV, and V) NOT DETECTED NOT DETECTED Final    Comment: Performed at [facility], [address], [place], [place] [address]    RADIOLOGY:  No results found.  EKG:   Orders placed or performed during the hospital encounter of 05/15/17  . ED EKG  . ED EKG      Management plans discussed with the patient, family and they are in agreement.  CODE STATUS:     Code Status Orders  (From admission, onward)        Start     Ordered   05/15/17 2049  Full code  Continuous     05/15/17 2048    Code Status History    Date Active Date Inactive Code Status Order ID Comments User Context   This patient has a current code status but no historical code status.    Advance Directive Documentation     Most Recent Value  Type of Advance Directive  Living will, Healthcare Power of Attorney  Pre-existing out of facility DNR order (yellow form or pink MOST form)  No data  "MOST" Form in Place?  No data      TOTAL TIME TAKING CARE OF THIS PATIENT: 35 minutes.    Vaughan Basta M.D on 05/24/2017 at 11:26 AM  Between 7am to 6pm - Pager - [phone]  After 6pm go to [email or web address] - password [email or web address] [place] [facility]  Office  [phone]  CC: Primary care physician; Juline Patch, MD   Note: This dictation was prepared with Dragon dictation along with smaller phrase  technology. Any transcriptional errors that result from this process are unintentional.

## 2017-05-25 ENCOUNTER — Other Ambulatory Visit: Payer: Self-pay

## 2017-05-25 ENCOUNTER — Telehealth: Payer: Self-pay

## 2017-05-25 DIAGNOSIS — G4733 Obstructive sleep apnea (adult) (pediatric): Secondary | ICD-10-CM | POA: Diagnosis not present

## 2017-05-25 DIAGNOSIS — G629 Polyneuropathy, unspecified: Secondary | ICD-10-CM | POA: Diagnosis not present

## 2017-05-25 DIAGNOSIS — G620 Drug-induced polyneuropathy: Secondary | ICD-10-CM | POA: Diagnosis not present

## 2017-05-25 DIAGNOSIS — I824Z1 Acute embolism and thrombosis of unspecified deep veins of right distal lower extremity: Secondary | ICD-10-CM | POA: Diagnosis not present

## 2017-05-25 DIAGNOSIS — K8020 Calculus of gallbladder without cholecystitis without obstruction: Secondary | ICD-10-CM | POA: Diagnosis not present

## 2017-05-25 DIAGNOSIS — A0472 Enterocolitis due to Clostridium difficile, not specified as recurrent: Secondary | ICD-10-CM | POA: Diagnosis not present

## 2017-05-25 DIAGNOSIS — R197 Diarrhea, unspecified: Secondary | ICD-10-CM | POA: Diagnosis not present

## 2017-05-25 DIAGNOSIS — K625 Hemorrhage of anus and rectum: Secondary | ICD-10-CM | POA: Diagnosis not present

## 2017-05-25 DIAGNOSIS — D7582 Heparin induced thrombocytopenia (HIT): Secondary | ICD-10-CM | POA: Diagnosis not present

## 2017-05-25 DIAGNOSIS — I824Y1 Acute embolism and thrombosis of unspecified deep veins of right proximal lower extremity: Secondary | ICD-10-CM | POA: Diagnosis not present

## 2017-05-25 DIAGNOSIS — D696 Thrombocytopenia, unspecified: Secondary | ICD-10-CM | POA: Diagnosis not present

## 2017-05-25 DIAGNOSIS — L138 Other specified bullous disorders: Secondary | ICD-10-CM | POA: Diagnosis not present

## 2017-05-25 DIAGNOSIS — I82411 Acute embolism and thrombosis of right femoral vein: Secondary | ICD-10-CM | POA: Diagnosis not present

## 2017-05-25 DIAGNOSIS — R300 Dysuria: Secondary | ICD-10-CM | POA: Diagnosis not present

## 2017-05-25 DIAGNOSIS — R279 Unspecified lack of coordination: Secondary | ICD-10-CM | POA: Diagnosis not present

## 2017-05-25 DIAGNOSIS — R5381 Other malaise: Secondary | ICD-10-CM | POA: Diagnosis not present

## 2017-05-25 DIAGNOSIS — A499 Bacterial infection, unspecified: Secondary | ICD-10-CM | POA: Diagnosis not present

## 2017-05-25 DIAGNOSIS — A0471 Enterocolitis due to Clostridium difficile, recurrent: Secondary | ICD-10-CM | POA: Diagnosis not present

## 2017-05-25 DIAGNOSIS — E46 Unspecified protein-calorie malnutrition: Secondary | ICD-10-CM | POA: Diagnosis not present

## 2017-05-25 DIAGNOSIS — R160 Hepatomegaly, not elsewhere classified: Secondary | ICD-10-CM | POA: Diagnosis not present

## 2017-05-25 DIAGNOSIS — R000 Tachycardia, unspecified: Secondary | ICD-10-CM | POA: Diagnosis not present

## 2017-05-25 DIAGNOSIS — K219 Gastro-esophageal reflux disease without esophagitis: Secondary | ICD-10-CM | POA: Diagnosis not present

## 2017-05-25 DIAGNOSIS — R748 Abnormal levels of other serum enzymes: Secondary | ICD-10-CM | POA: Diagnosis not present

## 2017-05-25 DIAGNOSIS — B370 Candidal stomatitis: Secondary | ICD-10-CM | POA: Diagnosis not present

## 2017-05-25 DIAGNOSIS — D539 Nutritional anemia, unspecified: Secondary | ICD-10-CM | POA: Diagnosis not present

## 2017-05-25 DIAGNOSIS — M6281 Muscle weakness (generalized): Secondary | ICD-10-CM | POA: Diagnosis not present

## 2017-05-25 DIAGNOSIS — I10 Essential (primary) hypertension: Secondary | ICD-10-CM | POA: Diagnosis not present

## 2017-05-25 NOTE — Telephone Encounter (Signed)
Discharge summary reviewed and discussed with Dr. Ronnald Ramp. A call was also received by the pt's husband with multiple concerns after discharge. Dr. Ronnald Ramp indicated that further discussion and review of care is needed before a decision can be made re: need for hosp f/u appt. At Dr. Ronnald Ramp request, a TCM call was not placed. Please see message below re: my inquiry for an appt and if a TCM call was needed:   Pt was recently d/c from [facility] on 05/24/17 - Cdiff. She is scheduled to f/u with GI (Dr. Bonna Gains) on 3/11 for rectal bleeding and Cdiff. You saw this pt on 05/04/17 and treated her for otitis media R ear. She was scheduled to f/u with you on 06/01/17 but canceled this appt due to her recent hospitalization. Although discharge instructions did not specify for her to f/u with you, I am assuming you are wanting to see her in follow up.   1. Would you like me to call this pt to f/u after her hosp d/c?  2. Would you like me to schedule her for a hosp f/u appt OR would you prefer the appt be a routine f/u appt to determine if her otitis media has resolved?   Thanks,  Coriana Angello

## 2017-05-25 NOTE — Telephone Encounter (Signed)
Pt's husband said that he had to call rescue to get pt into the house yesterday. She is still using the bathroom on herself. Husband cannot take care of her at home. I have been told by Brayton Layman, social worker at [facility], that Dr Penni Homans discharged her on Monday 18th but was "kind enough to let her stay one more day". Pt was discharged on Tuesday the 19th. It was told to the pt and husband that if she stayed in hospital, it would be under private pay. This is a couple with a fixed income who cannot afford to pay for private pay on a daily basis.  Pt was supposed to be discharged to [facility] under isolation. The concern is that pt's husband cannot take care of her and that he may get C-Diff himself. I have also contacted Malachy Mood at [employer] and [employer] to find out if they received the request from [facility]. They did see the request from [facility], but said it was still under review. I asked for a return call from whomever reviews these cases. A lady from [place] did return Dr Ronnald Ramp' call and asked for Monica's phone number. I gave it to her. As well as, we heard from the nurse from [facility] that was in the home seeing pt at the time. She stated she would not be admitting the pt under [facility] care, as she should not be in the home. I explained that the husband had tried to call 911 to get her transported to [facility], but was denied due to "just being discharged from [facility]". Seth Bake, the nurse, told us she knew someone from [facility] and had made arrangements for pick up/ transport by ambulance to [facility].

## 2017-05-26 DIAGNOSIS — I10 Essential (primary) hypertension: Secondary | ICD-10-CM | POA: Diagnosis not present

## 2017-05-26 DIAGNOSIS — D696 Thrombocytopenia, unspecified: Secondary | ICD-10-CM | POA: Diagnosis not present

## 2017-05-26 DIAGNOSIS — G629 Polyneuropathy, unspecified: Secondary | ICD-10-CM | POA: Diagnosis not present

## 2017-05-26 DIAGNOSIS — I824Y1 Acute embolism and thrombosis of unspecified deep veins of right proximal lower extremity: Secondary | ICD-10-CM | POA: Diagnosis not present

## 2017-05-26 DIAGNOSIS — L138 Other specified bullous disorders: Secondary | ICD-10-CM | POA: Diagnosis not present

## 2017-05-26 DIAGNOSIS — D539 Nutritional anemia, unspecified: Secondary | ICD-10-CM | POA: Diagnosis not present

## 2017-05-26 DIAGNOSIS — I82411 Acute embolism and thrombosis of right femoral vein: Secondary | ICD-10-CM | POA: Diagnosis not present

## 2017-05-26 DIAGNOSIS — A0472 Enterocolitis due to Clostridium difficile, not specified as recurrent: Secondary | ICD-10-CM | POA: Diagnosis not present

## 2017-05-26 DIAGNOSIS — I824Z1 Acute embolism and thrombosis of unspecified deep veins of right distal lower extremity: Secondary | ICD-10-CM | POA: Diagnosis not present

## 2017-05-26 MED ORDER — FOLIC ACID 1 MG PO TABS
1.00 mg | ORAL_TABLET | ORAL | Status: DC
Start: 2017-06-02 — End: 2017-05-26

## 2017-05-26 MED ORDER — PREDNISONE 5 MG PO TABS
10.00 mg | ORAL_TABLET | ORAL | Status: DC
Start: 2017-06-02 — End: 2017-05-26

## 2017-05-26 MED ORDER — ONDANSETRON 4 MG PO TBDP
4.00 mg | ORAL_TABLET | ORAL | Status: DC
Start: ? — End: 2017-05-26

## 2017-05-26 MED ORDER — GENERIC EXTERNAL MEDICATION
2.00 | Status: DC
Start: ? — End: 2017-05-26

## 2017-05-26 MED ORDER — OMEPRAZOLE 20 MG PO CPDR
20.00 mg | DELAYED_RELEASE_CAPSULE | ORAL | Status: DC
Start: 2017-05-26 — End: 2017-05-26

## 2017-05-26 MED ORDER — NYSTATIN 100000 UNIT/ML MT SUSP
500000.00 | OROMUCOSAL | Status: DC
Start: 2017-05-26 — End: 2017-05-26

## 2017-05-26 MED ORDER — ACETAMINOPHEN 325 MG PO TABS
650.00 mg | ORAL_TABLET | ORAL | Status: DC
Start: ? — End: 2017-05-26

## 2017-05-26 MED ORDER — QBRELIS PO
125.00 mg | ORAL | Status: DC
Start: 2017-06-01 — End: 2017-05-26

## 2017-05-26 MED ORDER — MELATONIN 3 MG PO TABS
3.00 mg | ORAL_TABLET | ORAL | Status: DC
Start: 2017-06-01 — End: 2017-05-26

## 2017-05-27 DIAGNOSIS — K219 Gastro-esophageal reflux disease without esophagitis: Secondary | ICD-10-CM | POA: Diagnosis not present

## 2017-05-27 DIAGNOSIS — A0472 Enterocolitis due to Clostridium difficile, not specified as recurrent: Secondary | ICD-10-CM | POA: Diagnosis not present

## 2017-05-27 DIAGNOSIS — D696 Thrombocytopenia, unspecified: Secondary | ICD-10-CM | POA: Diagnosis not present

## 2017-05-27 DIAGNOSIS — I82411 Acute embolism and thrombosis of right femoral vein: Secondary | ICD-10-CM | POA: Diagnosis not present

## 2017-05-28 DIAGNOSIS — D696 Thrombocytopenia, unspecified: Secondary | ICD-10-CM | POA: Diagnosis not present

## 2017-05-28 DIAGNOSIS — A0472 Enterocolitis due to Clostridium difficile, not specified as recurrent: Secondary | ICD-10-CM | POA: Diagnosis not present

## 2017-05-28 DIAGNOSIS — I82411 Acute embolism and thrombosis of right femoral vein: Secondary | ICD-10-CM | POA: Diagnosis not present

## 2017-05-28 DIAGNOSIS — L138 Other specified bullous disorders: Secondary | ICD-10-CM | POA: Diagnosis not present

## 2017-05-29 DIAGNOSIS — L138 Other specified bullous disorders: Secondary | ICD-10-CM | POA: Diagnosis not present

## 2017-05-29 DIAGNOSIS — D696 Thrombocytopenia, unspecified: Secondary | ICD-10-CM | POA: Diagnosis not present

## 2017-05-29 DIAGNOSIS — A0472 Enterocolitis due to Clostridium difficile, not specified as recurrent: Secondary | ICD-10-CM | POA: Diagnosis not present

## 2017-05-29 DIAGNOSIS — I82411 Acute embolism and thrombosis of right femoral vein: Secondary | ICD-10-CM | POA: Diagnosis not present

## 2017-05-30 ENCOUNTER — Telehealth: Payer: Self-pay | Admitting: Pediatrics

## 2017-05-30 DIAGNOSIS — I82411 Acute embolism and thrombosis of right femoral vein: Secondary | ICD-10-CM | POA: Diagnosis not present

## 2017-05-30 DIAGNOSIS — D696 Thrombocytopenia, unspecified: Secondary | ICD-10-CM | POA: Diagnosis not present

## 2017-05-30 DIAGNOSIS — R160 Hepatomegaly, not elsewhere classified: Secondary | ICD-10-CM | POA: Diagnosis not present

## 2017-05-30 DIAGNOSIS — L138 Other specified bullous disorders: Secondary | ICD-10-CM | POA: Diagnosis not present

## 2017-05-30 DIAGNOSIS — G4733 Obstructive sleep apnea (adult) (pediatric): Secondary | ICD-10-CM | POA: Diagnosis not present

## 2017-05-30 DIAGNOSIS — K8020 Calculus of gallbladder without cholecystitis without obstruction: Secondary | ICD-10-CM | POA: Diagnosis not present

## 2017-05-30 DIAGNOSIS — E46 Unspecified protein-calorie malnutrition: Secondary | ICD-10-CM | POA: Diagnosis not present

## 2017-05-30 NOTE — Telephone Encounter (Signed)
EMMI note: Received VM from patients husband as they wished to opt out of the automated calls.  I reached out to [facility] for direction to see how the patient/family could do this. Per husband, [facility] RN followed-up and said it was unsafe for her at home and directed him to take patient to the hospital.  Patient was taken to [facility].

## 2017-05-30 NOTE — Telephone Encounter (Signed)
Patient husband Mr. Balderston has reached out to [employer] and requested to opt out of calls.  Juliann Pulse to follow on how patient is to opt out.

## 2017-05-31 DIAGNOSIS — E46 Unspecified protein-calorie malnutrition: Secondary | ICD-10-CM | POA: Diagnosis not present

## 2017-05-31 DIAGNOSIS — R300 Dysuria: Secondary | ICD-10-CM | POA: Diagnosis not present

## 2017-05-31 DIAGNOSIS — I82411 Acute embolism and thrombosis of right femoral vein: Secondary | ICD-10-CM | POA: Diagnosis not present

## 2017-05-31 DIAGNOSIS — L138 Other specified bullous disorders: Secondary | ICD-10-CM | POA: Diagnosis not present

## 2017-05-31 DIAGNOSIS — D696 Thrombocytopenia, unspecified: Secondary | ICD-10-CM | POA: Diagnosis not present

## 2017-06-01 ENCOUNTER — Ambulatory Visit: Payer: Medicare HMO | Admitting: Family Medicine

## 2017-06-01 DIAGNOSIS — R197 Diarrhea, unspecified: Secondary | ICD-10-CM | POA: Diagnosis not present

## 2017-06-01 DIAGNOSIS — R300 Dysuria: Secondary | ICD-10-CM | POA: Diagnosis not present

## 2017-06-01 DIAGNOSIS — Z7901 Long term (current) use of anticoagulants: Secondary | ICD-10-CM | POA: Diagnosis not present

## 2017-06-01 DIAGNOSIS — I82401 Acute embolism and thrombosis of unspecified deep veins of right lower extremity: Secondary | ICD-10-CM | POA: Diagnosis not present

## 2017-06-01 DIAGNOSIS — E569 Vitamin deficiency, unspecified: Secondary | ICD-10-CM | POA: Diagnosis not present

## 2017-06-01 DIAGNOSIS — R5381 Other malaise: Secondary | ICD-10-CM | POA: Diagnosis not present

## 2017-06-01 DIAGNOSIS — A0472 Enterocolitis due to Clostridium difficile, not specified as recurrent: Secondary | ICD-10-CM | POA: Diagnosis not present

## 2017-06-01 DIAGNOSIS — D538 Other specified nutritional anemias: Secondary | ICD-10-CM | POA: Diagnosis not present

## 2017-06-01 DIAGNOSIS — K219 Gastro-esophageal reflux disease without esophagitis: Secondary | ICD-10-CM | POA: Diagnosis not present

## 2017-06-01 DIAGNOSIS — I4891 Unspecified atrial fibrillation: Secondary | ICD-10-CM | POA: Diagnosis not present

## 2017-06-01 DIAGNOSIS — E58 Dietary calcium deficiency: Secondary | ICD-10-CM | POA: Diagnosis not present

## 2017-06-01 DIAGNOSIS — M6281 Muscle weakness (generalized): Secondary | ICD-10-CM | POA: Diagnosis not present

## 2017-06-01 DIAGNOSIS — L138 Other specified bullous disorders: Secondary | ICD-10-CM | POA: Diagnosis not present

## 2017-06-01 DIAGNOSIS — G64 Other disorders of peripheral nervous system: Secondary | ICD-10-CM | POA: Diagnosis not present

## 2017-06-01 DIAGNOSIS — R21 Rash and other nonspecific skin eruption: Secondary | ICD-10-CM | POA: Diagnosis not present

## 2017-06-01 DIAGNOSIS — N390 Urinary tract infection, site not specified: Secondary | ICD-10-CM | POA: Diagnosis not present

## 2017-06-01 DIAGNOSIS — I82411 Acute embolism and thrombosis of right femoral vein: Secondary | ICD-10-CM | POA: Diagnosis not present

## 2017-06-01 DIAGNOSIS — D696 Thrombocytopenia, unspecified: Secondary | ICD-10-CM | POA: Diagnosis not present

## 2017-06-01 DIAGNOSIS — A499 Bacterial infection, unspecified: Secondary | ICD-10-CM | POA: Diagnosis not present

## 2017-06-01 DIAGNOSIS — R279 Unspecified lack of coordination: Secondary | ICD-10-CM | POA: Diagnosis not present

## 2017-06-01 DIAGNOSIS — G4709 Other insomnia: Secondary | ICD-10-CM | POA: Diagnosis not present

## 2017-06-01 MED ORDER — FONDAPARINUX SODIUM 7.5 MG/0.6ML ~~LOC~~ SOLN
7.50 mg | SUBCUTANEOUS | Status: DC
Start: 2017-06-01 — End: 2017-06-01

## 2017-06-01 MED ORDER — ALUM HYDROXIDE-MAG TRISILICATE 80-14.2 MG PO CHEW
2.00 | CHEWABLE_TABLET | ORAL | Status: DC
Start: ? — End: 2017-06-01

## 2017-06-01 MED ORDER — FAMOTIDINE 20 MG PO TABS
20.00 mg | ORAL_TABLET | ORAL | Status: DC
Start: 2017-06-01 — End: 2017-06-01

## 2017-06-01 MED ORDER — WARFARIN SODIUM 5 MG PO TABS
5.00 mg | ORAL_TABLET | ORAL | Status: DC
Start: 2017-06-01 — End: 2017-06-01

## 2017-06-04 DIAGNOSIS — A0472 Enterocolitis due to Clostridium difficile, not specified as recurrent: Secondary | ICD-10-CM | POA: Diagnosis not present

## 2017-06-04 DIAGNOSIS — M6281 Muscle weakness (generalized): Secondary | ICD-10-CM | POA: Diagnosis not present

## 2017-06-04 DIAGNOSIS — R300 Dysuria: Secondary | ICD-10-CM | POA: Diagnosis not present

## 2017-06-04 DIAGNOSIS — G64 Other disorders of peripheral nervous system: Secondary | ICD-10-CM | POA: Diagnosis not present

## 2017-06-04 DIAGNOSIS — Z7901 Long term (current) use of anticoagulants: Secondary | ICD-10-CM | POA: Diagnosis not present

## 2017-06-04 DIAGNOSIS — L138 Other specified bullous disorders: Secondary | ICD-10-CM | POA: Diagnosis not present

## 2017-06-04 DIAGNOSIS — K219 Gastro-esophageal reflux disease without esophagitis: Secondary | ICD-10-CM | POA: Diagnosis not present

## 2017-06-04 DIAGNOSIS — I82401 Acute embolism and thrombosis of unspecified deep veins of right lower extremity: Secondary | ICD-10-CM | POA: Diagnosis not present

## 2017-06-04 NOTE — Progress Notes (Signed)
[place] at [place] NAME: Lindsay Stephenson    MR#:  [id number]  DATE OF BIRTH:  July 11, 1945  SUBJECTIVE:  CHIEF COMPLAINT:   Chief Complaint  Patient presents with  . Rectal Bleeding  . Abdominal Pain  Received some antibiotic for her ear infection 2-3 weeks ago, since then have diarrhea, nausea, not able to eat enough and loss of strength. Came to emergency room last week and at urgent care Center found to have C. difficile so given oral Flagyl. She could not tolerate it and continued to have symptoms so came back and admitted for management of C. difficile with IV antibiotics. She also had some blood in her stool on presentation. For last few days progressive improvement in consistency of stool and frequency. Electrolyte imbalance noticed due to prolonged illness and corrected as checked. Pt remains very weak but slowly increasing oral intake.   REVIEW OF SYSTEMS:  CONSTITUTIONAL: No fever, positive for fatigue or weakness.  EYES: No blurred or double vision.  EARS, NOSE, AND THROAT: No tinnitus or ear pain.  RESPIRATORY: No cough, shortness of breath, wheezing or hemoptysis.  CARDIOVASCULAR: No chest pain, orthopnea, edema.  GASTROINTESTINAL: No nausea, vomiting, positive for diarrhea or abdominal pain.  GENITOURINARY: No dysuria, hematuria.  ENDOCRINE: No polyuria, nocturia,  HEMATOLOGY: No anemia, easy bruising or bleeding SKIN: No rash or lesion. MUSCULOSKELETAL: No joint pain or arthritis.   NEUROLOGIC: No tingling, numbness, weakness.  PSYCHIATRY: No anxiety or depression.   ROS  DRUG ALLERGIES:   Allergies  Allergen Reactions  . Entex Lq [Phenylephrine-Guaifenesin] Swelling    TONGUE  . Codeine Hives and Other (See Comments)    Pt reports only having symptoms after receiving large amounts of medication  . Sulfa Antibiotics Itching  . Tape Other (See Comments)    CAN PULL OFF LAYER OF SKIN    VITALS:  Blood pressure (!) 147/75,  pulse (!) 109, temperature 98.5 F (36.9 C), resp. rate 20, height 5' (1.524 m), weight 86.2 kg (190 lb), SpO2 98 %.  PHYSICAL EXAMINATION:  GENERAL:  72 y.o.-year-old patient lying in the bed with no acute distress.  EYES: Pupils equal, round, reactive to light and accommodation. No scleral icterus. Extraocular muscles intact.  HEENT: Head atraumatic, normocephalic. Oropharynx and nasopharynx clear.  NECK:  Supple, no jugular venous distention. No thyroid enlargement, no tenderness.  LUNGS: Normal breath sounds bilaterally, no wheezing, rales,rhonchi or crepitation. No use of accessory muscles of respiration.  CARDIOVASCULAR: S1, S2 normal. No murmurs, rubs, or gallops.  ABDOMEN: Soft, mild tender, nondistended. Bowel sounds present. No organomegaly or mass.  EXTREMITIES: No pedal edema, cyanosis, or clubbing.  NEUROLOGIC: Cranial nerves II through XII are intact. Muscle strength 4/5 in all extremities. Sensation intact. Gait not checked.  PSYCHIATRIC: The patient is alert and oriented x 3.  SKIN: No obvious rash, lesion, or ulcer.   Physical Exam LABORATORY PANEL:   CBC No results for input(s): WBC, HGB, HCT, PLT in the last 168 hours. ------------------------------------------------------------------------------------------------------------------  Chemistries  No results for input(s): NA, K, CL, CO2, GLUCOSE, BUN, CREATININE, CALCIUM, MG, AST, ALT, ALKPHOS, BILITOT in the last 168 hours.  Invalid input(s): GFRCGP ------------------------------------------------------------------------------------------------------------------  Cardiac Enzymes No results for input(s): TROPONINI in the last 168 hours. ------------------------------------------------------------------------------------------------------------------  RADIOLOGY:  No results found.  ASSESSMENT AND PLAN:   Active Problems:   Clostridium difficile colitis   Protein-calorie malnutrition, severe  * C. difficile  colitis Failed outpatient treatment.   continue  vancomycin p.o.  Zofran as needed.  IV fluids support.  Appreciated consult by GI, added IV Flagyl.   Will call surgical consult if any worsening in the condition then we have to suspect toxic megacolon.   Diarrhea improving, continue monitoring.   Overall feeling better. Upgrade die to soft.   Frequency of stool is decreasing.   added dietary fiber to help with loose stools.  * Rectal bleeding.  Follow hemoglobin and GI consult. Hemoglobin stable, likely due to C. difficile colitis.   GI suggested to monitor for now. No anticoagulation due to this.    * Hypokalemia.  Give potassium supplement and follow-up potassium and magnesium level.   Magnesium is low, replace IV. Replace oral, Improving.  * Hypertension, controlled.   Monitor with IV fluid.   Stable.  * Hypophosphatemia   Replace by pharmacist as needed.  * Thrombocytopenia   May be due to infection- gradual drop since admission, no active bleed, monitor.  PT suggest SNF , still awaited insurance approval.    All the records are reviewed and case discussed with Care Management/Social Workerr. Management plans discussed with the patient, family and they are in agreement.  CODE STATUS: Full.  TOTAL TIME TAKING CARE OF THIS PATIENT: 35 minutes.   POSSIBLE D/C IN 1-2 DAYS, DEPENDING ON CLINICAL CONDITION AND INSURANCE APPROVAL.   Vaughan Basta M.D on 06/04/2017   Between 7am to 6pm - Pager - [phone]  After 6pm go to [email or web address] - password [email or web address] [place] [facility]  Office  [phone]  CC: Primary care physician; Juline Patch, MD  Note: This dictation was prepared with Dragon dictation along with smaller phrase technology. Any transcriptional errors that result from this process are unintentional.  This is a late entry note, as pt was seen but documentation was missed on the day.

## 2017-06-05 NOTE — Progress Notes (Signed)
I called and spoke to pt's husband on phone on 06/05/17 morning, He had told me about his " Bad experience " with this hospital. He mentioned several things including her being very weak, that she could not get up to go to bedside comode and he had to help her clean. The visiting nurse suggested to take her to hospital , and he took her with EMS to [facility]. He is upset that Education officer, museum was very rude to him and he was told " we had tried many times to get approval from insurance." as per his understanding from insurance company and rehab- he had found out," they had tried many times and sent fax to hospital but did not get good response." "Because of this and only having twice getting out of bed in hospital stay, she was very weak and stayed in [facility] for next 6 days and finally discharged to rehab".  He also had questions about her platelets found low ( 60) while admitted at [facility], and was concerned about this, I had explained that - we suggested it was due to infection and could be checked within 1 week after discharge, along with her electrolytes as she was recovering from a long illness.  Overall- even after talking to me- he is still very disappointed by the care she received here, but thanked me for calling him.

## 2017-06-07 ENCOUNTER — Other Ambulatory Visit
Admission: RE | Admit: 2017-06-07 | Discharge: 2017-06-07 | Disposition: A | Discharge status: Home / Self Care | Payer: Medicare HMO | Source: Other Acute Inpatient Hospital | Attending: Family Medicine | Admitting: Family Medicine

## 2017-06-07 DIAGNOSIS — R197 Diarrhea, unspecified: Secondary | ICD-10-CM | POA: Insufficient documentation

## 2017-06-07 LAB — C DIFFICILE QUICK SCREEN W PCR REFLEX
C DIFFICILE (CDIFF) TOXIN: NEGATIVE
C DIFFICLE (CDIFF) ANTIGEN: NEGATIVE
C Diff interpretation: NOT DETECTED

## 2017-06-09 ENCOUNTER — Other Ambulatory Visit: Payer: Self-pay

## 2017-06-10 ENCOUNTER — Telehealth: Payer: Self-pay

## 2017-06-10 ENCOUNTER — Other Ambulatory Visit: Payer: Self-pay

## 2017-06-10 DIAGNOSIS — N390 Urinary tract infection, site not specified: Secondary | ICD-10-CM | POA: Diagnosis not present

## 2017-06-10 DIAGNOSIS — E43 Unspecified severe protein-calorie malnutrition: Secondary | ICD-10-CM | POA: Diagnosis not present

## 2017-06-10 DIAGNOSIS — E785 Hyperlipidemia, unspecified: Secondary | ICD-10-CM | POA: Diagnosis not present

## 2017-06-10 DIAGNOSIS — I471 Supraventricular tachycardia: Secondary | ICD-10-CM | POA: Diagnosis not present

## 2017-06-10 DIAGNOSIS — M5136 Other intervertebral disc degeneration, lumbar region: Secondary | ICD-10-CM | POA: Diagnosis not present

## 2017-06-10 DIAGNOSIS — M172 Bilateral post-traumatic osteoarthritis of knee: Secondary | ICD-10-CM | POA: Diagnosis not present

## 2017-06-10 DIAGNOSIS — G4733 Obstructive sleep apnea (adult) (pediatric): Secondary | ICD-10-CM | POA: Diagnosis not present

## 2017-06-10 DIAGNOSIS — G20 Parkinson's disease: Secondary | ICD-10-CM | POA: Diagnosis not present

## 2017-06-10 DIAGNOSIS — J45909 Unspecified asthma, uncomplicated: Secondary | ICD-10-CM | POA: Diagnosis not present

## 2017-06-10 DIAGNOSIS — A0472 Enterocolitis due to Clostridium difficile, not specified as recurrent: Secondary | ICD-10-CM | POA: Diagnosis not present

## 2017-06-10 DIAGNOSIS — I10 Essential (primary) hypertension: Secondary | ICD-10-CM | POA: Diagnosis not present

## 2017-06-10 NOTE — Telephone Encounter (Signed)
Seth Bake called to give update on patient. Seth Bake will go out to access once a week. She came home with coumadin 18m qday- needs further eval for dvt at vein and vascular.

## 2017-06-13 ENCOUNTER — Ambulatory Visit: Payer: Medicare HMO | Admitting: Gastroenterology

## 2017-06-13 DIAGNOSIS — A0472 Enterocolitis due to Clostridium difficile, not specified as recurrent: Secondary | ICD-10-CM | POA: Diagnosis not present

## 2017-06-13 DIAGNOSIS — G4733 Obstructive sleep apnea (adult) (pediatric): Secondary | ICD-10-CM | POA: Diagnosis not present

## 2017-06-13 DIAGNOSIS — M5136 Other intervertebral disc degeneration, lumbar region: Secondary | ICD-10-CM | POA: Diagnosis not present

## 2017-06-13 DIAGNOSIS — E43 Unspecified severe protein-calorie malnutrition: Secondary | ICD-10-CM | POA: Diagnosis not present

## 2017-06-13 DIAGNOSIS — G20 Parkinson's disease: Secondary | ICD-10-CM | POA: Diagnosis not present

## 2017-06-13 DIAGNOSIS — M172 Bilateral post-traumatic osteoarthritis of knee: Secondary | ICD-10-CM | POA: Diagnosis not present

## 2017-06-13 DIAGNOSIS — J45909 Unspecified asthma, uncomplicated: Secondary | ICD-10-CM | POA: Diagnosis not present

## 2017-06-13 DIAGNOSIS — E785 Hyperlipidemia, unspecified: Secondary | ICD-10-CM | POA: Diagnosis not present

## 2017-06-13 DIAGNOSIS — I471 Supraventricular tachycardia: Secondary | ICD-10-CM | POA: Diagnosis not present

## 2017-06-13 DIAGNOSIS — I10 Essential (primary) hypertension: Secondary | ICD-10-CM | POA: Diagnosis not present

## 2017-06-14 DIAGNOSIS — I10 Essential (primary) hypertension: Secondary | ICD-10-CM | POA: Diagnosis not present

## 2017-06-14 DIAGNOSIS — E785 Hyperlipidemia, unspecified: Secondary | ICD-10-CM | POA: Diagnosis not present

## 2017-06-14 DIAGNOSIS — M172 Bilateral post-traumatic osteoarthritis of knee: Secondary | ICD-10-CM | POA: Diagnosis not present

## 2017-06-14 DIAGNOSIS — I471 Supraventricular tachycardia: Secondary | ICD-10-CM | POA: Diagnosis not present

## 2017-06-14 DIAGNOSIS — J45909 Unspecified asthma, uncomplicated: Secondary | ICD-10-CM | POA: Diagnosis not present

## 2017-06-14 DIAGNOSIS — A0472 Enterocolitis due to Clostridium difficile, not specified as recurrent: Secondary | ICD-10-CM | POA: Diagnosis not present

## 2017-06-14 DIAGNOSIS — M5136 Other intervertebral disc degeneration, lumbar region: Secondary | ICD-10-CM | POA: Diagnosis not present

## 2017-06-14 DIAGNOSIS — G4733 Obstructive sleep apnea (adult) (pediatric): Secondary | ICD-10-CM | POA: Diagnosis not present

## 2017-06-14 DIAGNOSIS — E43 Unspecified severe protein-calorie malnutrition: Secondary | ICD-10-CM | POA: Diagnosis not present

## 2017-06-14 DIAGNOSIS — G20 Parkinson's disease: Secondary | ICD-10-CM | POA: Diagnosis not present

## 2017-06-15 ENCOUNTER — Telehealth: Payer: Self-pay | Admitting: Gastroenterology

## 2017-06-15 ENCOUNTER — Ambulatory Visit: Payer: Medicare HMO | Admitting: Family Medicine

## 2017-06-15 ENCOUNTER — Encounter: Payer: Self-pay | Admitting: Family Medicine

## 2017-06-15 VITALS — BP 120/78 | HR 112 | Ht 60.0 in | Wt 184.0 lb

## 2017-06-15 DIAGNOSIS — R932 Abnormal findings on diagnostic imaging of liver and biliary tract: Secondary | ICD-10-CM | POA: Diagnosis not present

## 2017-06-15 DIAGNOSIS — I82401 Acute embolism and thrombosis of unspecified deep veins of right lower extremity: Secondary | ICD-10-CM

## 2017-06-15 DIAGNOSIS — Z09 Encounter for follow-up examination after completed treatment for conditions other than malignant neoplasm: Secondary | ICD-10-CM | POA: Diagnosis not present

## 2017-06-15 DIAGNOSIS — A0472 Enterocolitis due to Clostridium difficile, not specified as recurrent: Secondary | ICD-10-CM

## 2017-06-15 NOTE — Progress Notes (Addendum)
Name: Lindsay Stephenson   MRN: [id number]    DOB: 01-Mar-1946   Date:06/15/2017       Progress Note  Subjective  Chief Complaint  Chief Complaint  Patient presents with  . Follow-up    discharged from [facility] on 06/09/2017- last day of cipro today    Patient presents for hospital discharge.  assestment of right DVT.  followup c diff colitis. Follow up [facility] . [facility] foolow stoped today.      No problem-specific Assessment & Plan notes found for this encounter.   Past Medical History:  Diagnosis Date  . Allergy   . Anemia    TAKING IRON  . Arthritis    KNEES AND HANDS  . Asthma    COUGHING, NO ATTACKS IN A YEAR OR 2  . Dyspnea    ON EXERTION  . GERD (gastroesophageal reflux disease)   . HOH (hard of hearing)    30-35 % HEARING LOSS  . Hyperlipidemia   . Hypertension    HX OF  . LABD (linear immunoglobulin A bullous dermatosis)   . Mitral valve prolapse   . Mitral valve prolapse   . 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 Parkinson's disease ([place])   . Rectal bleeding   . Sleep apnea    CPAP  . Vertigo     Past Surgical History:  Procedure Laterality Date  . ABDOMINAL HYSTERECTOMY    . APPENDECTOMY    . COLONOSCOPY WITH PROPOFOL N/A 01/16/2016   Procedure: COLONOSCOPY WITH PROPOFOL;  Surgeon: Lucilla Lame, MD;  Location: [facility];  Service: Endoscopy;  Laterality: N/A;  CPAP  . KNEE SURGERY     2013 and 2014  . POLYPECTOMY  01/16/2016   Procedure: POLYPECTOMY;  Surgeon: Lucilla Lame, MD;  Location: [facility];  Service: Endoscopy;;  . REPLACEMENT TOTAL KNEE BILATERAL Bilateral    2013, 2014  . TONSILLECTOMY     ADENOIDECTOMY    Family History  Problem Relation Age of Onset  . Osteoporosis Mother   . Chronic Renal Failure Father   . Arthritis/Rheumatoid Sister   . Stroke Sister   . Fibromyalgia Sister   . Breast cancer Sister 23  . Diabetes Brother     Social History   Socioeconomic History  . Marital  status: Married    Spouse name: Not on file  . Number of children: Not on file  . Years of education: Not on file  . Highest education level: Not on file  Social Needs  . Financial resource strain: Not on file  . Food insecurity - worry: Not on file  . Food insecurity - inability: Not on file  . Transportation needs - medical: Not on file  . Transportation needs - non-medical: Not on file  Occupational History  . Occupation: retired    Comment: high school french  Tobacco Use  . Smoking status: Former Smoker    Years: 3.00    Types: Cigarettes    Last attempt to quit: 1969    Years since quitting: 50.2  . Smokeless tobacco: Never Used  Substance and Sexual Activity  . Alcohol use: No    Comment: 4-5 times a year  . Drug use: No  . Sexual activity: Not Currently  Other Topics Concern  . Not on file  Social History Narrative   She is a retired Camera operator.   She lives with husband and son in a one story home.  Has  2 children.   Moved here from [place].   Highest level of education:  Masters     Allergies  Allergen Reactions  . Entex Lq [Phenylephrine-Guaifenesin] Swelling    TONGUE  . Codeine Hives and Other (See Comments)    Pt reports only having symptoms after receiving large amounts of medication  . Sulfa Antibiotics Itching  . Tape Other (See Comments)    CAN PULL OFF LAYER OF SKIN    Outpatient Medications Prior to Visit  Medication Sig Dispense Refill  . albuterol (PROVENTIL HFA;VENTOLIN HFA) 108 (90 Base) MCG/ACT inhaler Inhale 1-2 puffs into the lungs every 6 (six) hours as needed for wheezing or shortness of breath. 1 Inhaler 1  . Calcium Carbonate-Vit D-Min (CALCIUM 1200 PO) Take by mouth.    . ciprofloxacin (CIPRO) 250 MG tablet Take 1 tablet by mouth 2 (two) times daily.    . folic acid (FOLVITE) 1 MG tablet Take 1 mg by mouth daily.     . Hydrocortisone (GERHARDT'S BUTT CREAM) CREA Apply 1 application topically daily. 1 each 0  . levocetirizine (XYZAL)  5 MG tablet Take 1 tablet (5 mg total) by mouth as needed. 90 tablet 1  . methotrexate 2.5 MG tablet Take 10 mg by mouth once a week. Takes on Fridays    . Multiple Vitamin (MULTIVITAMIN) capsule Take 1 capsule by mouth daily.    Marland Kitchen omeprazole (PRILOSEC) 20 MG capsule Take 1 capsule (20 mg total) by mouth daily. 90 capsule 3  . potassium chloride SA (K-DUR,KLOR-CON) 20 MEQ tablet Take 1 tablet (20 mEq total) by mouth 2 (two) times daily. 60 tablet 1  . predniSONE (DELTASONE) 5 MG tablet Take 10 mg by mouth.     . Probiotic Product (TRUNATURE PROBIOTIC FOR KIDS) CHEW Chew 1 each by mouth daily.    Marland Kitchen warfarin (COUMADIN) 5 MG tablet Take 1 tablet by mouth daily.    Marland Kitchen gabapentin (NEURONTIN) 100 MG capsule Take 100 mg by mouth at bedtime. Dr Sharol Roussel    . lidocaine (XYLOCAINE) 5 % ointment Apply topically 2 (two) times daily as needed for moderate pain. 35.44 g 0  . magnesium oxide (MAG-OX) 400 (241.3 Mg) MG tablet Take 1 tablet (400 mg total) by mouth daily. 30 tablet 0  . ondansetron (ZOFRAN ODT) 4 MG disintegrating tablet Take 1 tablet (4 mg total) by mouth every 8 (eight) hours as needed for nausea or vomiting. 20 tablet 0   No facility-administered medications prior to visit.     Review of Systems  Constitutional: Negative for chills, fever, malaise/fatigue and weight loss.  HENT: Negative for ear discharge, ear pain and sore throat.   Eyes: Negative for blurred vision.  Respiratory: Negative for cough, sputum production, shortness of breath and wheezing.   Cardiovascular: Negative for chest pain, palpitations and leg swelling.  Gastrointestinal: Negative for abdominal pain, blood in stool, constipation, diarrhea, heartburn, melena and nausea.  Genitourinary: Negative for dysuria, frequency, hematuria and urgency.  Musculoskeletal: Negative for back pain, joint pain, myalgias and neck pain.  Skin: Negative for rash.  Neurological: Negative for dizziness, tingling, sensory change, focal  weakness and headaches.  Endo/Heme/Allergies: Negative for environmental allergies and polydipsia. Does not bruise/bleed easily.  Psychiatric/Behavioral: Negative for depression and suicidal ideas. The patient is not nervous/anxious and does not have insomnia.      Objective  Vitals:   06/15/17 0937  BP: 120/78  Pulse: (!) 112  Weight: 184 lb (83.5 kg)  Height: 5' (1.524 m)  Physical Exam  Constitutional: She is well-developed, well-nourished, and in no distress. No distress.  HENT:  Head: Normocephalic and atraumatic.  Right Ear: External ear normal.  Left Ear: External ear normal.  Nose: Nose normal.  Mouth/Throat: Oropharynx is clear and moist.  Eyes: Conjunctivae and EOM are normal. Pupils are equal, round, and reactive to light. Right eye exhibits no discharge. Left eye exhibits no discharge.  Neck: Normal range of motion. Neck supple. No JVD present. No thyromegaly present.  Cardiovascular: Normal rate, regular rhythm, normal heart sounds and intact distal pulses. Exam reveals no gallop and no friction rub.  No murmur heard. Pulmonary/Chest: Effort normal and breath sounds normal. She has no wheezes. She has no rales.  Abdominal: Soft. Bowel sounds are normal. She exhibits no mass. There is no tenderness. There is no guarding.  Musculoskeletal: Normal range of motion. She exhibits no edema.  Lymphadenopathy:    She has no cervical adenopathy.  Neurological: She is alert.  Skin: Skin is warm and dry. She is not diaphoretic.  Psychiatric: Mood and affect normal.      Assessment & Plan  Problem List Items Addressed This Visit    None    Visit Diagnoses    Hospital discharge follow-up    -  Primary   C. difficile colitis       reslolved   Relevant Orders   Ambulatory referral to Gastroenterology   Acute deep vein thrombosis (DVT) of right lower extremity, unspecified vein (HCC)       Relevant Medications   warfarin (COUMADIN) 5 MG tablet   Other Relevant  Orders   Ambulatory referral to Vascular Surgery   INR/PT   Abnormal findings on diagnostic imaging of gall bladder          No orders of the defined types were placed in this encounter.     Dr. [facility]  06/15/17

## 2017-06-15 NOTE — Telephone Encounter (Signed)
Dr. Allen Norris contact Dr. Ronnald Ramp in regards to this pt. A referral has been placed and pt will be contacted for an office appt.

## 2017-06-15 NOTE — Telephone Encounter (Signed)
Terra with Dr. Ronnald Ramp office calling in regards to pt she would like for Ginger to call Dr. Ronnald Ramp Directly at [phone]

## 2017-06-16 ENCOUNTER — Other Ambulatory Visit: Payer: Self-pay

## 2017-06-16 DIAGNOSIS — E43 Unspecified severe protein-calorie malnutrition: Secondary | ICD-10-CM | POA: Diagnosis not present

## 2017-06-16 DIAGNOSIS — M172 Bilateral post-traumatic osteoarthritis of knee: Secondary | ICD-10-CM | POA: Diagnosis not present

## 2017-06-16 DIAGNOSIS — M5136 Other intervertebral disc degeneration, lumbar region: Secondary | ICD-10-CM | POA: Diagnosis not present

## 2017-06-16 DIAGNOSIS — E785 Hyperlipidemia, unspecified: Secondary | ICD-10-CM | POA: Diagnosis not present

## 2017-06-16 DIAGNOSIS — J45909 Unspecified asthma, uncomplicated: Secondary | ICD-10-CM | POA: Diagnosis not present

## 2017-06-16 DIAGNOSIS — I471 Supraventricular tachycardia: Secondary | ICD-10-CM | POA: Diagnosis not present

## 2017-06-16 DIAGNOSIS — G20 Parkinson's disease: Secondary | ICD-10-CM | POA: Diagnosis not present

## 2017-06-16 DIAGNOSIS — G4733 Obstructive sleep apnea (adult) (pediatric): Secondary | ICD-10-CM | POA: Diagnosis not present

## 2017-06-16 DIAGNOSIS — A0472 Enterocolitis due to Clostridium difficile, not specified as recurrent: Secondary | ICD-10-CM | POA: Diagnosis not present

## 2017-06-16 DIAGNOSIS — I10 Essential (primary) hypertension: Secondary | ICD-10-CM | POA: Diagnosis not present

## 2017-06-16 LAB — PROTIME-INR
INR: 3.5 — ABNORMAL HIGH (ref 0.8–1.2)
Prothrombin Time: 33.7 s — ABNORMAL HIGH (ref 9.1–12.0)

## 2017-06-16 MED ORDER — WARFARIN SODIUM 4 MG PO TABS: 4.0000 mg | ORAL_TABLET | Freq: Every day | ORAL | 0 refills | Status: DC

## 2017-06-20 DIAGNOSIS — J45909 Unspecified asthma, uncomplicated: Secondary | ICD-10-CM | POA: Diagnosis not present

## 2017-06-20 DIAGNOSIS — I471 Supraventricular tachycardia: Secondary | ICD-10-CM | POA: Diagnosis not present

## 2017-06-20 DIAGNOSIS — G20 Parkinson's disease: Secondary | ICD-10-CM | POA: Diagnosis not present

## 2017-06-20 DIAGNOSIS — M172 Bilateral post-traumatic osteoarthritis of knee: Secondary | ICD-10-CM | POA: Diagnosis not present

## 2017-06-20 DIAGNOSIS — M5136 Other intervertebral disc degeneration, lumbar region: Secondary | ICD-10-CM | POA: Diagnosis not present

## 2017-06-20 DIAGNOSIS — I10 Essential (primary) hypertension: Secondary | ICD-10-CM | POA: Diagnosis not present

## 2017-06-20 DIAGNOSIS — E43 Unspecified severe protein-calorie malnutrition: Secondary | ICD-10-CM | POA: Diagnosis not present

## 2017-06-20 DIAGNOSIS — G4733 Obstructive sleep apnea (adult) (pediatric): Secondary | ICD-10-CM | POA: Diagnosis not present

## 2017-06-20 DIAGNOSIS — A0472 Enterocolitis due to Clostridium difficile, not specified as recurrent: Secondary | ICD-10-CM | POA: Diagnosis not present

## 2017-06-20 DIAGNOSIS — E785 Hyperlipidemia, unspecified: Secondary | ICD-10-CM | POA: Diagnosis not present

## 2017-06-21 ENCOUNTER — Other Ambulatory Visit: Payer: Self-pay | Admitting: *Deleted

## 2017-06-21 DIAGNOSIS — E43 Unspecified severe protein-calorie malnutrition: Secondary | ICD-10-CM | POA: Diagnosis not present

## 2017-06-21 DIAGNOSIS — J45909 Unspecified asthma, uncomplicated: Secondary | ICD-10-CM | POA: Diagnosis not present

## 2017-06-21 DIAGNOSIS — G20 Parkinson's disease: Secondary | ICD-10-CM | POA: Diagnosis not present

## 2017-06-21 DIAGNOSIS — E785 Hyperlipidemia, unspecified: Secondary | ICD-10-CM | POA: Diagnosis not present

## 2017-06-21 DIAGNOSIS — A0472 Enterocolitis due to Clostridium difficile, not specified as recurrent: Secondary | ICD-10-CM | POA: Diagnosis not present

## 2017-06-21 DIAGNOSIS — G4733 Obstructive sleep apnea (adult) (pediatric): Secondary | ICD-10-CM | POA: Diagnosis not present

## 2017-06-21 DIAGNOSIS — I10 Essential (primary) hypertension: Secondary | ICD-10-CM | POA: Diagnosis not present

## 2017-06-21 DIAGNOSIS — M172 Bilateral post-traumatic osteoarthritis of knee: Secondary | ICD-10-CM | POA: Diagnosis not present

## 2017-06-21 DIAGNOSIS — I471 Supraventricular tachycardia: Secondary | ICD-10-CM | POA: Diagnosis not present

## 2017-06-21 DIAGNOSIS — M5136 Other intervertebral disc degeneration, lumbar region: Secondary | ICD-10-CM | POA: Diagnosis not present

## 2017-06-21 MED ORDER — AMBULATORY NON FORMULARY MEDICATION: 1.0000 [IU] | Freq: Every day | 0 refills | Status: AC

## 2017-06-22 ENCOUNTER — Other Ambulatory Visit: Payer: Self-pay

## 2017-06-22 DIAGNOSIS — E43 Unspecified severe protein-calorie malnutrition: Secondary | ICD-10-CM | POA: Diagnosis not present

## 2017-06-22 DIAGNOSIS — M172 Bilateral post-traumatic osteoarthritis of knee: Secondary | ICD-10-CM | POA: Diagnosis not present

## 2017-06-22 DIAGNOSIS — I10 Essential (primary) hypertension: Secondary | ICD-10-CM | POA: Diagnosis not present

## 2017-06-22 DIAGNOSIS — G4733 Obstructive sleep apnea (adult) (pediatric): Secondary | ICD-10-CM | POA: Diagnosis not present

## 2017-06-22 DIAGNOSIS — G20 Parkinson's disease: Secondary | ICD-10-CM | POA: Diagnosis not present

## 2017-06-22 DIAGNOSIS — M5136 Other intervertebral disc degeneration, lumbar region: Secondary | ICD-10-CM | POA: Diagnosis not present

## 2017-06-22 DIAGNOSIS — A0472 Enterocolitis due to Clostridium difficile, not specified as recurrent: Secondary | ICD-10-CM | POA: Diagnosis not present

## 2017-06-22 DIAGNOSIS — I471 Supraventricular tachycardia: Secondary | ICD-10-CM | POA: Diagnosis not present

## 2017-06-22 DIAGNOSIS — E785 Hyperlipidemia, unspecified: Secondary | ICD-10-CM | POA: Diagnosis not present

## 2017-06-22 DIAGNOSIS — J45909 Unspecified asthma, uncomplicated: Secondary | ICD-10-CM | POA: Diagnosis not present

## 2017-06-23 ENCOUNTER — Ambulatory Visit: Payer: Medicare HMO | Admitting: Gastroenterology

## 2017-06-23 ENCOUNTER — Encounter: Payer: Self-pay | Admitting: Gastroenterology

## 2017-06-23 ENCOUNTER — Telehealth: Payer: Self-pay

## 2017-06-23 VITALS — BP 132/89 | HR 105 | Ht 60.0 in | Wt 184.0 lb

## 2017-06-23 DIAGNOSIS — K529 Noninfective gastroenteritis and colitis, unspecified: Secondary | ICD-10-CM

## 2017-06-23 DIAGNOSIS — A09 Infectious gastroenteritis and colitis, unspecified: Secondary | ICD-10-CM

## 2017-06-23 NOTE — Progress Notes (Signed)
Primary Care Physician: Juline Patch, MD  Primary Gastroenterologist:  Dr. Lucilla Lame  Chief Complaint  Patient presents with  . Constipation  . Hemorrhoids    HPI: Lindsay Stephenson is a 72 y.o. female here for follow-up after being discharged from the hospital.  The patient states she was in the hospital for 19 days and was sent home with immediate return to [facility] where she was treated. The patient reports that she is gaining her strength back and has not had any further rectal bleeding.  The patient has a history of having a colonoscopy with biopsy showing colitis.  The patient reports that her stools are soft but not watery.  She also reports that her main concern is her hemorrhoids of this time.  The patient states she has been having trouble cleaning herself after having a bowel movement.  She denies any abdominal pain nausea vomiting fevers or chills.  Current Outpatient Medications  Medication Sig Dispense Refill  . albuterol (PROVENTIL HFA;VENTOLIN HFA) 108 (90 Base) MCG/ACT inhaler Inhale 1-2 puffs into the lungs every 6 (six) hours as needed for wheezing or shortness of breath. 1 Inhaler 1  . folic acid (FOLVITE) 1 MG tablet Take 1 mg by mouth daily.     . Hydrocortisone (GERHARDT'S BUTT CREAM) CREA Apply 1 application topically daily. 1 each 0  . methotrexate 2.5 MG tablet Take 10 mg by mouth once a week. Takes on Fridays    . Multiple Vitamin (MULTIVITAMIN) capsule Take 1 capsule by mouth daily.    Marland Kitchen omeprazole (PRILOSEC) 20 MG capsule Take 1 capsule (20 mg total) by mouth daily. 90 capsule 3  . potassium chloride SA (K-DUR,KLOR-CON) 20 MEQ tablet Take 1 tablet (20 mEq total) by mouth 2 (two) times daily. 60 tablet 1  . predniSONE (DELTASONE) 5 MG tablet Take 10 mg by mouth.     . Probiotic Product (TRUNATURE PROBIOTIC FOR KIDS) CHEW Chew 1 each by mouth daily.    Marland Kitchen warfarin (COUMADIN) 4 MG tablet Take 1 tablet (4 mg total) by mouth daily. 30 tablet 0  . AMBULATORY  NON FORMULARY MEDICATION 1 Units by Other route daily. Walk in bathtub/shower 1 Units 0  . Calcium Carbonate-Vit D-Min (CALCIUM 1200 PO) Take by mouth.    . levocetirizine (XYZAL) 5 MG tablet Take 1 tablet (5 mg total) by mouth as needed. (Patient not taking: Reported on 06/23/2017) 90 tablet 1   No current facility-administered medications for this visit.     Allergies as of 06/23/2017 - Review Complete 06/15/2017  Allergen Reaction Noted  . Entex lq [phenylephrine-guaifenesin] Swelling 08/18/2015  . Phenylephrine-guaifenesin Swelling 05/25/2017  . Amoxicillin-pot clavulanate  05/25/2017  . Codeine Hives and Other (See Comments) 08/18/2015  . Sulfa antibiotics Itching 08/18/2015  . Tape Other (See Comments) 01/08/2016    ROS:  General: Negative for anorexia, weight loss, fever, chills, fatigue, weakness. ENT: Negative for hoarseness, difficulty swallowing , nasal congestion. CV: Negative for chest pain, angina, palpitations, dyspnea on exertion, peripheral edema.  Respiratory: Negative for dyspnea at rest, dyspnea on exertion, cough, sputum, wheezing.  GI: See history of present illness. GU:  Negative for dysuria, hematuria, urinary incontinence, urinary frequency, nocturnal urination.  Endo: Negative for unusual weight change.    Physical Examination:   BP 132/89   Pulse (!) 105   Ht 5' (1.524 m)   Wt 184 lb (83.5 kg)   BMI 35.94 kg/m   General: Well-nourished, well-developed in no acute distress.  Eyes: No icterus. Conjunctivae pink. Extremities: No lower extremity edema. No clubbing or deformities. Neuro: Alert and oriented x 3.  Grossly intact. Skin: Warm and dry, no jaundice.   Psych: Alert and cooperative, normal mood and affect.  Labs:    Imaging Studies: No results found.  Assessment and Plan:   Lindsay Stephenson is a 9 y.o. y/o female who comes in today after being discharged from the hospital due to C. Difficile colitis.  The patient has been doing much better  without any further GI bleeding.  The patient does have a history of colitis on her biopsies.  When the patient is feeling much better and has recovered from her present illness she may consider undergoing a repeat colonoscopy.  The patient has been told that if her stools do not solidify and improve that she may need to be started on mesalamine.  She has also been told increase fiber in her diet to help solidify her stools and also avoid her from being constipated.  The patient has also been told to use topical creams for hemorrhoids and if they do not improve she will be set up with the hemorrhoidal clinic for banding. The patient has been explained the plan and agrees with it.    Lucilla Lame, MD. [facility]   Note: This dictation was prepared with Dragon dictation along with smaller phrase technology. Any transcriptional errors that result from this process are unintentional.

## 2017-06-23 NOTE — Telephone Encounter (Signed)
HH/ Seth Bake called to say that pt's heart rate is normal and she will keep monitoring it

## 2017-06-24 DIAGNOSIS — M5136 Other intervertebral disc degeneration, lumbar region: Secondary | ICD-10-CM | POA: Diagnosis not present

## 2017-06-24 DIAGNOSIS — J45909 Unspecified asthma, uncomplicated: Secondary | ICD-10-CM | POA: Diagnosis not present

## 2017-06-24 DIAGNOSIS — E43 Unspecified severe protein-calorie malnutrition: Secondary | ICD-10-CM | POA: Diagnosis not present

## 2017-06-24 DIAGNOSIS — I471 Supraventricular tachycardia: Secondary | ICD-10-CM | POA: Diagnosis not present

## 2017-06-24 DIAGNOSIS — G20 Parkinson's disease: Secondary | ICD-10-CM | POA: Diagnosis not present

## 2017-06-24 DIAGNOSIS — E785 Hyperlipidemia, unspecified: Secondary | ICD-10-CM | POA: Diagnosis not present

## 2017-06-24 DIAGNOSIS — G4733 Obstructive sleep apnea (adult) (pediatric): Secondary | ICD-10-CM | POA: Diagnosis not present

## 2017-06-24 DIAGNOSIS — A0472 Enterocolitis due to Clostridium difficile, not specified as recurrent: Secondary | ICD-10-CM | POA: Diagnosis not present

## 2017-06-24 DIAGNOSIS — I10 Essential (primary) hypertension: Secondary | ICD-10-CM | POA: Diagnosis not present

## 2017-06-24 DIAGNOSIS — M172 Bilateral post-traumatic osteoarthritis of knee: Secondary | ICD-10-CM | POA: Diagnosis not present

## 2017-06-27 ENCOUNTER — Telehealth: Payer: Self-pay | Admitting: Gastroenterology

## 2017-06-27 DIAGNOSIS — I10 Essential (primary) hypertension: Secondary | ICD-10-CM | POA: Diagnosis not present

## 2017-06-27 DIAGNOSIS — E785 Hyperlipidemia, unspecified: Secondary | ICD-10-CM | POA: Diagnosis not present

## 2017-06-27 DIAGNOSIS — I471 Supraventricular tachycardia: Secondary | ICD-10-CM | POA: Diagnosis not present

## 2017-06-27 DIAGNOSIS — J45909 Unspecified asthma, uncomplicated: Secondary | ICD-10-CM | POA: Diagnosis not present

## 2017-06-27 DIAGNOSIS — M5136 Other intervertebral disc degeneration, lumbar region: Secondary | ICD-10-CM | POA: Diagnosis not present

## 2017-06-27 DIAGNOSIS — G20 Parkinson's disease: Secondary | ICD-10-CM | POA: Diagnosis not present

## 2017-06-27 DIAGNOSIS — A0472 Enterocolitis due to Clostridium difficile, not specified as recurrent: Secondary | ICD-10-CM | POA: Diagnosis not present

## 2017-06-27 DIAGNOSIS — G4733 Obstructive sleep apnea (adult) (pediatric): Secondary | ICD-10-CM | POA: Diagnosis not present

## 2017-06-27 DIAGNOSIS — M172 Bilateral post-traumatic osteoarthritis of knee: Secondary | ICD-10-CM | POA: Diagnosis not present

## 2017-06-27 DIAGNOSIS — E43 Unspecified severe protein-calorie malnutrition: Secondary | ICD-10-CM | POA: Diagnosis not present

## 2017-06-27 NOTE — Telephone Encounter (Signed)
Pt saw Dr. Allen Norris  Last Thursday and he told pt to do something that Dr. Allen Norris came up with for hemrroids  and she is calling for instructions on that please call  cb [phone]

## 2017-06-28 ENCOUNTER — Encounter (INDEPENDENT_AMBULATORY_CARE_PROVIDER_SITE_OTHER): Payer: Self-pay | Admitting: Vascular Surgery

## 2017-06-28 ENCOUNTER — Ambulatory Visit (INDEPENDENT_AMBULATORY_CARE_PROVIDER_SITE_OTHER): Payer: Medicare HMO | Admitting: Vascular Surgery

## 2017-06-28 VITALS — BP 146/83 | HR 60 | Resp 16 | Ht 60.0 in | Wt 184.0 lb

## 2017-06-28 DIAGNOSIS — I82401 Acute embolism and thrombosis of unspecified deep veins of right lower extremity: Secondary | ICD-10-CM

## 2017-06-28 DIAGNOSIS — I10 Essential (primary) hypertension: Secondary | ICD-10-CM | POA: Diagnosis not present

## 2017-06-28 DIAGNOSIS — E785 Hyperlipidemia, unspecified: Secondary | ICD-10-CM

## 2017-06-28 DIAGNOSIS — I82409 Acute embolism and thrombosis of unspecified deep veins of unspecified lower extremity: Secondary | ICD-10-CM | POA: Insufficient documentation

## 2017-06-28 DIAGNOSIS — A0472 Enterocolitis due to Clostridium difficile, not specified as recurrent: Secondary | ICD-10-CM

## 2017-06-28 NOTE — Assessment & Plan Note (Signed)
The patient reportedly had a distal vein DVT in the right lower extremity several weeks ago.  At this point, she has been maintained on anticoagulation which given her debilitated and immobile state was probably very reasonable.  I think it would be reasonable to repeat a DVT study at this time as her mobility is improving to see if we can get her off of anticoagulation.  I discussed the natural history and pathophysiology of DVT.  I have discussed the reason and rationale for treatment.  The patient should elevate her legs as tolerated and increase her activity as tolerated.  I will see her back in the next couple of weeks with a DVT study.

## 2017-06-28 NOTE — Assessment & Plan Note (Signed)
Improved but debilitated her for some time.

## 2017-06-28 NOTE — Progress Notes (Signed)
Patient ID: Lindsay Stephenson, female   DOB: February 18, 1946, 72 y.o.   MRN: [id number]  Chief Complaint  Patient presents with  . New Patient (Initial Visit)    Prior DVT    HPI Lindsay Stephenson is a 72 y.o. female.  I am asked to see the patient by Dr. Ronnald Ramp for evaluation of DVT.  The patient reports having a clot in her right calf while she was hospitalized for several weeks with bad C. difficile colitis.  She was then in rehabilitation for several weeks after that with only recently being discharged home.  She has been on anticoagulation and reports her last INR to be 3.  She has no pain or swelling in her legs.  Her mobility is improving but still not at her baseline.  She denies chest pain or shortness of breath.  No fevers or chills.   Past Medical History:  Diagnosis Date  . Allergy   . Anemia    TAKING IRON  . Arthritis    KNEES AND HANDS  . Asthma    COUGHING, NO ATTACKS IN A YEAR OR 2  . Dyspnea    ON EXERTION  . GERD (gastroesophageal reflux disease)   . HOH (hard of hearing)    30-35 % HEARING LOSS  . Hyperlipidemia   . Hypertension    HX OF  . LABD (linear immunoglobulin A bullous dermatosis)   . Mitral valve prolapse   . Mitral valve prolapse   . 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 Parkinson's disease ([place])   . Rectal bleeding   . Sleep apnea    CPAP  . Vertigo     Past Surgical History:  Procedure Laterality Date  . ABDOMINAL HYSTERECTOMY    . APPENDECTOMY    . COLONOSCOPY WITH PROPOFOL N/A 01/16/2016   Procedure: COLONOSCOPY WITH PROPOFOL;  Surgeon: Lucilla Lame, MD;  Location: [facility];  Service: Endoscopy;  Laterality: N/A;  CPAP  . KNEE SURGERY     2013 and 2014  . POLYPECTOMY  01/16/2016   Procedure: POLYPECTOMY;  Surgeon: Lucilla Lame, MD;  Location: [facility];  Service: Endoscopy;;  . REPLACEMENT TOTAL KNEE BILATERAL Bilateral    2013, 2014  . TONSILLECTOMY     ADENOIDECTOMY    Family  History  Problem Relation Age of Onset  . Osteoporosis Mother   . Chronic Renal Failure Father   . Arthritis/Rheumatoid Sister   . Stroke Sister   . Fibromyalgia Sister   . Breast cancer Sister 63  . Diabetes Brother     Social History Social History   Tobacco Use  . Smoking status: Former Smoker    Years: 3.00    Types: Cigarettes    Last attempt to quit: 1969    Years since quitting: 50.2  . Smokeless tobacco: Never Used  Substance Use Topics  . Alcohol use: No    Comment: 4-5 times a year  . Drug use: No    Allergies  Allergen Reactions  . Entex Lq [Phenylephrine-Guaifenesin] Swelling    TONGUE  . Phenylephrine-Guaifenesin Swelling  . Amoxicillin-Pot Clavulanate     Other reaction(s): Other (See Comments) Loss of appetite, weakness  . Codeine Hives and Other (See Comments)    Pt reports only having symptoms after receiving large amounts of medication  . Sulfa Antibiotics Itching  . Tape Other (See Comments)    CAN PULL OFF LAYER OF SKIN  Current Outpatient Medications  Medication Sig Dispense Refill  . acetaminophen (TYLENOL) 325 MG tablet Take 650 mg by mouth.    Marland Kitchen albuterol (PROVENTIL HFA;VENTOLIN HFA) 108 (90 Base) MCG/ACT inhaler Inhale 1-2 puffs into the lungs every 6 (six) hours as needed for wheezing or shortness of breath. 1 Inhaler 1  . Alum Hydroxide-Mag Trisilicate 19-62.2 MG CHEW Chew by mouth.    . AMBULATORY NON FORMULARY MEDICATION 1 Units by Other route daily. Walk in bathtub/shower 1 Units 0  . Calcium Carb-Ergocalciferol 250-125 MG-UNIT TABS Take by mouth.    . Calcium Carbonate-Vit D-Min (CALCIUM 1200 PO) Take by mouth.    . folic acid (FOLVITE) 1 MG tablet Take 1 mg by mouth daily.     . Hydrocortisone (GERHARDT'S BUTT CREAM) CREA Apply 1 application topically daily. 1 each 0  . levocetirizine (XYZAL) 5 MG tablet Take 1 tablet (5 mg total) by mouth as needed. 90 tablet 1  . Melatonin 3 MG TABS Take 3 mg by mouth.    . methotrexate 2.5 MG  tablet Take 10 mg by mouth once a week. Takes on Fridays    . Multiple Vitamin (MULTIVITAMIN) capsule Take 1 capsule by mouth daily.    Marland Kitchen omeprazole (PRILOSEC) 20 MG capsule Take 1 capsule (20 mg total) by mouth daily. 90 capsule 3  . potassium chloride SA (K-DUR,KLOR-CON) 20 MEQ tablet Take 1 tablet (20 mEq total) by mouth 2 (two) times daily. 60 tablet 1  . predniSONE (DELTASONE) 5 MG tablet Take 10 mg by mouth.     . Probiotic Product (TRUNATURE PROBIOTIC FOR KIDS) CHEW Chew 1 each by mouth daily.    . vancomycin (VANCOCIN) 125 MG capsule     . warfarin (COUMADIN) 4 MG tablet Take 1 tablet (4 mg total) by mouth daily. 30 tablet 0   No current facility-administered medications for this visit.       REVIEW OF SYSTEMS (Negative unless checked)  Constitutional: _0 Weight loss  _1 Fever  _2 Chills Cardiac: _3 Chest pain   _4 Chest pressure   _5 Palpitations   _6 Shortness of breath when laying flat   _7 Shortness of breath at rest   _8 Shortness of breath with exertion. Vascular:  _9 Pain in legs with walking   _10 Pain in legs at rest   _11 Pain in legs when laying flat   _12 Claudication   _13 Pain in feet when walking  _14 Pain in feet at rest  _15 Pain in feet when laying flat   _16 History of DVT   _17 Phlebitis   _18 Swelling in legs   _19 Varicose veins   _20 Non-healing ulcers Pulmonary:   _21 Uses home oxygen   _22 Productive cough   _23 Hemoptysis   _24 Wheeze  _25 COPD   _26 Asthma Neurologic:  _27 Dizziness  _28 Blackouts   _29 Seizures   _30 History of stroke   _31 History of TIA  _32 Aphasia   _33 Temporary blindness   _34 Dysphagia   _35 Weakness or numbness in arms   _36 Weakness or numbness in legs Musculoskeletal:  _37 Arthritis   _38 Joint swelling   _39 Joint pain   _40 Low back pain Hematologic:  _41 Easy bruising  _42 Easy bleeding   _43 Hypercoagulable state   _44 Anemic  _45 Hepatitis Gastrointestinal:  _46 Blood in stool   _47 Vomiting blood  _48 Gastroesophageal reflux/heartburn   _49 Abdominal pain Genitourinary:  _50 Chronic kidney disease    _51 Difficult urination  _52 Frequent urination  _53 Burning with urination   _54 Hematuria Skin:  _55 Rashes   _56 Ulcers   _57 Wounds Psychological:  _58 History of anxiety   _59  History of major depression.    Physical Exam BP (!) 146/83 (BP  Location: Left Arm, Patient Position: Sitting)   Pulse 60   Resp 16   Ht 5' (1.524 m)   Wt 184 lb (83.5 kg)   BMI 35.94 kg/m  Gen:  WD/WN, NAD Head: [place]/AT, No temporalis wasting Ear/Nose/Throat: Hearing grossly intact, nares w/o erythema or drainage, oropharynx w/o Erythema/Exudate Eyes: Conjunctiva clear, sclera non-icteric  Neck: trachea midline.  Pulmonary:  Good air movement, respirations not labored, no use of accessory muscles Cardiac: RRR, no JVD Vascular:  Vessel Right Left  Radial Palpable Palpable                          PT Palpable Palpable  DP Palpable Palpable   Gastrointestinal: soft, non-tender/non-distended.  Musculoskeletal:   Extremities without ischemic changes.  No deformity or atrophy.  Trace bilateral lower extremity edema.  Diffusely weak and uses a wheelchair Neurologic: Sensation grossly intact in extremities.  Symmetrical.  Speech is fluent. Motor exam as listed above. Psychiatric: Judgment intact, Mood & affect appropriate for pt's clinical situation. Dermatologic: No rashes or ulcers noted.  No cellulitis or open wounds.  Radiology No results found.  Labs Recent Results (from the past 2160 hour(s))  Lipase, blood     Status: None   Collection Time: 05/09/17  1:21 PM  Result Value Ref Range   Lipase 38 11 - 51 U/L    Comment: Performed at [facility], [address] [place], [place] [address]  Comprehensive metabolic panel     Status: Abnormal   Collection Time: 05/09/17  1:21 PM  Result Value Ref Range   Sodium 139 135 - 145 mmol/L   Potassium 2.7 (LL) 3.5 - 5.1 mmol/L    Comment: CRITICAL RESULT CALLED TO, READ BACK BY AND VERIFIED WITH GREG MOYER AT 1409 05/09/17 DAS    Chloride 102 101 - 111  mmol/L   CO2 19 (L) 22 - 32 mmol/L   Glucose, Bld 87 65 - 99 mg/dL   BUN 7 6 - 20 mg/dL   Creatinine, Ser 0.55 0.44 - 1.00 mg/dL   Calcium 9.2 8.9 - 10.3 mg/dL   Total Protein 7.0 6.5 - 8.1 g/dL   Albumin 3.8 3.5 - 5.0 g/dL   AST 16 15 - 41 U/L   ALT 12 (L) 14 - 54 U/L   Alkaline Phosphatase 62 38 - 126 U/L   Total Bilirubin 1.1 0.3 - 1.2 mg/dL   GFR calc non Af Amer >60 >60 mL/min   GFR calc Af Amer >60 >60 mL/min    Comment: (NOTE) The eGFR has been calculated using the CKD EPI equation. This calculation has not been validated in all clinical situations. eGFR's persistently <60 mL/min signify possible Chronic Kidney Disease.    Anion gap 18 (H) 5 - 15    Comment: Performed at [facility], [address] [place], [place] [address]  CBC     Status: Abnormal   Collection Time: 05/09/17  1:21 PM  Result Value Ref Range   WBC 12.6 (H) 3.6 - 11.0 K/uL   RBC 4.76 3.80 - 5.20 MIL/uL   Hemoglobin 14.7 12.0 - 16.0 g/dL   HCT 44.9 35.0 - 47.0 %   MCV 94.3 80.0 - 100.0 fL   MCH 30.9 26.0 - 34.0 pg   MCHC 32.8 32.0 - 36.0 g/dL   RDW 14.3 11.5 - 14.5 %   Platelets 272 150 - 440 K/uL    Comment: Performed at [facility],  [place], [place] [address]  Urinalysis, Complete w Microscopic     Status: Abnormal   Collection Time: 05/09/17  1:21 PM  Result Value Ref Range   Color, Urine YELLOW (A) YELLOW   APPearance HAZY (A) CLEAR   Specific Gravity, Urine 1.018 1.005 - 1.030   pH 5.0 5.0 - 8.0   Glucose, UA NEGATIVE NEGATIVE mg/dL   Hgb urine dipstick LARGE (A) NEGATIVE   Bilirubin Urine NEGATIVE NEGATIVE   Ketones, ur 80 (A) NEGATIVE mg/dL   Protein, ur 100 (A) NEGATIVE mg/dL   Nitrite NEGATIVE NEGATIVE   Leukocytes, UA SMALL (A) NEGATIVE   RBC / HPF TOO NUMEROUS TO COUNT 0 - 5 RBC/hpf   WBC, UA 6-30 0 - 5 WBC/hpf   Bacteria, UA RARE (A) NONE SEEN   Squamous Epithelial / LPF 0-5 (A) NONE SEEN   Mucus PRESENT    Budding Yeast PRESENT     Hyaline Casts, UA PRESENT     Comment: Performed at [facility], [address] [place], [place] [address]  Urine Culture     Status: Abnormal   Collection Time: 05/09/17  1:21 PM  Result Value Ref Range   Specimen Description      URINE, RANDOM Performed at [facility], [address], [place], [place] [address]    Special Requests      NONE Performed at [facility], [address] [place], [place] [address]    Culture MULTIPLE SPECIES PRESENT, SUGGEST RECOLLECTION (A)    Report Status 05/11/2017 FINAL   Magnesium     Status: None   Collection Time: 05/09/17  1:37 PM  Result Value Ref Range   Magnesium 1.7 1.7 - 2.4 mg/dL    Comment: Performed at [facility], [address] [place], [place] [address]  CBC with Differential/Platelet     Status: Abnormal   Collection Time: 05/11/17 10:21 AM  Result Value Ref Range   WBC 11.8 (H) 3.6 - 11.0 K/uL   RBC 4.30 3.80 - 5.20 MIL/uL   Hemoglobin 13.5 12.0 - 16.0 g/dL   HCT 40.3 35.0 - 47.0 %   MCV 93.8 80.0 - 100.0 fL   MCH 31.5 26.0 - 34.0 pg   MCHC 33.6 32.0 - 36.0 g/dL   RDW 14.5 11.5 - 14.5 %   Platelets 238 150 - 440 K/uL   Neutrophils Relative % 82 %   Neutro Abs 9.7 (H) 1.4 - 6.5 K/uL   Lymphocytes Relative 9 %   Lymphs Abs 1.1 1.0 - 3.6 K/uL   Monocytes Relative 7 %   Monocytes Absolute 0.8 0.2 - 0.9 K/uL   Eosinophils Relative 2 %   Eosinophils Absolute 0.2 0 - 0.7 K/uL   Basophils Relative 0 %   Basophils Absolute 0.0 0 - 0.1 K/uL    Comment: Performed at [facility], [address] [place], [place] [address]  Comprehensive metabolic panel     Status: Abnormal   Collection Time: 05/11/17 10:21 AM  Result Value Ref Range   Sodium 137 135 - 145 mmol/L   Potassium 2.8 (L) 3.5 - 5.1 mmol/L   Chloride 107 101 - 111 mmol/L   CO2 17 (L) 22 - 32 mmol/L   Glucose, Bld 90 65 - 99 mg/dL   BUN <5 (L) 6 - 20 mg/dL   Creatinine, Ser 0.51 0.44 - 1.00 mg/dL   Calcium  8.4 (L) 8.9 - 10.3 mg/dL   Total  Protein 6.3 (L) 6.5 - 8.1 g/dL   Albumin 3.4 (L) 3.5 - 5.0 g/dL   AST 15 15 - 41 U/L   ALT 12 (L) 14 - 54 U/L   Alkaline Phosphatase 56 38 - 126 U/L   Total Bilirubin 1.0 0.3 - 1.2 mg/dL   GFR calc non Af Amer >60 >60 mL/min   GFR calc Af Amer >60 >60 mL/min    Comment: (NOTE) The eGFR has been calculated using the CKD EPI equation. This calculation has not been validated in all clinical situations. eGFR's persistently <60 mL/min signify possible Chronic Kidney Disease.    Anion gap 13 5 - 15    Comment: Performed at [facility], [address], [place], [place] [address]  C difficile quick scan w PCR reflex     Status: Abnormal   Collection Time: 05/11/17 10:30 AM  Result Value Ref Range   C Diff antigen POSITIVE (A) NEGATIVE   C Diff toxin NEGATIVE NEGATIVE   C Diff interpretation Results are indeterminate. See PCR results.     Comment: Performed at [facility], [address] [place], [place] [address]  C. Diff by PCR, Reflexed     Status: Abnormal   Collection Time: 05/11/17 10:30 AM  Result Value Ref Range   Toxigenic C. Difficile by PCR POSITIVE (A) NEGATIVE    Comment: Positive for toxigenic C. difficile with little to no toxin production. Only treat if clinical presentation suggests symptomatic illness. Performed at [facility], [address] [place], [place] [address]   Gastrointestinal Panel by PCR , Stool     Status: None   Collection Time: 05/15/17  2:41 PM  Result Value Ref Range   Campylobacter species NOT DETECTED NOT DETECTED   Plesimonas shigelloides NOT DETECTED NOT DETECTED   Salmonella species NOT DETECTED NOT DETECTED   Yersinia enterocolitica NOT DETECTED NOT DETECTED   Vibrio species NOT DETECTED NOT DETECTED   Vibrio cholerae NOT DETECTED NOT DETECTED   Enteroaggregative E coli (EAEC) NOT DETECTED NOT DETECTED   Enteropathogenic E coli (EPEC) NOT DETECTED NOT DETECTED    Enterotoxigenic E coli (ETEC) NOT DETECTED NOT DETECTED   Shiga like toxin producing E coli (STEC) NOT DETECTED NOT DETECTED   Shigella/Enteroinvasive E coli (EIEC) NOT DETECTED NOT DETECTED   Cryptosporidium NOT DETECTED NOT DETECTED   Cyclospora cayetanensis NOT DETECTED NOT DETECTED   Entamoeba histolytica NOT DETECTED NOT DETECTED   Giardia lamblia NOT DETECTED NOT DETECTED   Adenovirus F40/41 NOT DETECTED NOT DETECTED   Astrovirus NOT DETECTED NOT DETECTED   Norovirus GI/GII NOT DETECTED NOT DETECTED   Rotavirus A NOT DETECTED NOT DETECTED   Sapovirus (I, II, IV, and V) NOT DETECTED NOT DETECTED    Comment: Performed at [facility], [address] [place], [place] [address]  Lactic acid, plasma     Status: None   Collection Time: 05/15/17  3:47 PM  Result Value Ref Range   Lactic Acid, Venous 1.1 0.5 - 1.9 mmol/L    Comment: Performed at [facility], [address] [place],  [address]  CBC with Differential     Status: Abnormal   Collection Time: 05/15/17  3:47 PM  Result Value Ref Range   WBC 10.4 3.6 - 11.0 K/uL   RBC 4.67 3.80 - 5.20 MIL/uL   Hemoglobin 14.7 12.0 - 16.0 g/dL   HCT 43.9 35.0 - 47.0 %   MCV 93.8 80.0 - 100.0 fL  MCH 31.4 26.0 - 34.0 pg   MCHC 33.5 32.0 - 36.0 g/dL   RDW 15.1 (H) 11.5 - 14.5 %   Platelets 189 150 - 440 K/uL   Neutrophils Relative % 78 %   Neutro Abs 8.2 (H) 1.4 - 6.5 K/uL   Lymphocytes Relative 10 %   Lymphs Abs 1.0 1.0 - 3.6 K/uL   Monocytes Relative 10 %   Monocytes Absolute 1.0 (H) 0.2 - 0.9 K/uL   Eosinophils Relative 1 %   Eosinophils Absolute 0.0 0 - 0.7 K/uL   Basophils Relative 1 %   Basophils Absolute 0.1 0 - 0.1 K/uL    Comment: Performed at [facility], [address] [place], [place] [address]  Urinalysis, Complete w Microscopic     Status: Abnormal   Collection Time: 05/15/17  3:48 PM  Result Value Ref Range   Color, Urine YELLOW (A) YELLOW   APPearance CLEAR (A) CLEAR    Specific Gravity, Urine >1.046 (H) 1.005 - 1.030   pH 6.0 5.0 - 8.0   Glucose, UA NEGATIVE NEGATIVE mg/dL   Hgb urine dipstick LARGE (A) NEGATIVE   Bilirubin Urine NEGATIVE NEGATIVE   Ketones, ur 80 (A) NEGATIVE mg/dL   Protein, ur NEGATIVE NEGATIVE mg/dL   Nitrite NEGATIVE NEGATIVE   Leukocytes, UA TRACE (A) NEGATIVE   RBC / HPF 6-30 0 - 5 RBC/hpf   WBC, UA 0-5 0 - 5 WBC/hpf   Bacteria, UA NONE SEEN NONE SEEN   Squamous Epithelial / LPF NONE SEEN NONE SEEN   Mucus PRESENT    Hyaline Casts, UA PRESENT     Comment: Performed at [facility], [address] [place], [place] [address]  Comprehensive metabolic panel     Status: Abnormal   Collection Time: 05/15/17  5:58 PM  Result Value Ref Range   Sodium 139 135 - 145 mmol/L   Potassium 3.2 (L) 3.5 - 5.1 mmol/L    Comment: HEMOLYSIS AT THIS LEVEL MAY AFFECT RESULT   Chloride 106 101 - 111 mmol/L   CO2 18 (L) 22 - 32 mmol/L   Glucose, Bld 88 65 - 99 mg/dL   BUN <5 (L) 6 - 20 mg/dL   Creatinine, Ser 0.56 0.44 - 1.00 mg/dL   Calcium 8.3 (L) 8.9 - 10.3 mg/dL   Total Protein 4.9 (L) 6.5 - 8.1 g/dL   Albumin 2.5 (L) 3.5 - 5.0 g/dL   AST 18 15 - 41 U/L    Comment: HEMOLYSIS AT THIS LEVEL MAY AFFECT RESULT   ALT 9 (L) 14 - 54 U/L   Alkaline Phosphatase 48 38 - 126 U/L   Total Bilirubin 1.4 (H) 0.3 - 1.2 mg/dL    Comment: HEMOLYSIS AT THIS LEVEL MAY AFFECT RESULT   GFR calc non Af Amer >60 >60 mL/min   GFR calc Af Amer >60 >60 mL/min    Comment: (NOTE) The eGFR has been calculated using the CKD EPI equation. This calculation has not been validated in all clinical situations. eGFR's persistently <60 mL/min signify possible Chronic Kidney Disease.    Anion gap 15 5 - 15    Comment: Performed at [facility], [address] [place], [place] [address]  Lipase, blood     Status: None   Collection Time: 05/15/17  5:58 PM  Result Value Ref Range   Lipase 40 11 - 51 U/L    Comment: Performed at [facility], [address], [place],  [address]  Magnesium     Status: Abnormal   Collection Time: 05/15/17  5:58 PM  Result Value Ref Range   Magnesium 1.6 (L) 1.7 - 2.4 mg/dL    Comment: Performed at [facility], [address] [place], [place] [address]  Basic metabolic panel     Status: Abnormal   Collection Time: 05/16/17  4:15 AM  Result Value Ref Range   Sodium 138 135 - 145 mmol/L   Potassium 2.1 (LL) 3.5 - 5.1 mmol/L    Comment: CRITICAL RESULT CALLED TO, READ BACK BY AND VERIFIED WITH MARSHA [facility] AT 2423 05/16/17.PMH   Chloride 106 101 - 111 mmol/L   CO2 18 (L) 22 - 32 mmol/L   Glucose, Bld 82 65 - 99 mg/dL   BUN <5 (L) 6 - 20 mg/dL   Creatinine, Ser 0.48 0.44 - 1.00 mg/dL   Calcium 7.9 (L) 8.9 - 10.3 mg/dL   GFR calc non Af Amer >60 >60 mL/min   GFR calc Af Amer >60 >60 mL/min    Comment: (NOTE) The eGFR has been calculated using the CKD EPI equation. This calculation has not been validated in all clinical situations. eGFR's persistently <60 mL/min signify possible Chronic Kidney Disease.    Anion gap 14 5 - 15    Comment: Performed at [facility], [address] [place], [place] [address]  CBC     Status: Abnormal   Collection Time: 05/16/17  4:15 AM  Result Value Ref Range   WBC 7.6 3.6 - 11.0 K/uL   RBC 4.00 3.80 - 5.20 MIL/uL   Hemoglobin 12.7 12.0 - 16.0 g/dL   HCT 36.9 35.0 - 47.0 %   MCV 92.2 80.0 - 100.0 fL   MCH 31.7 26.0 - 34.0 pg   MCHC 34.4 32.0 - 36.0 g/dL   RDW 14.8 (H) 11.5 - 14.5 %   Platelets 158 150 - 440 K/uL    Comment: Performed at [facility], [address] [place], [place] [address]  Basic metabolic panel     Status: Abnormal   Collection Time: 05/17/17  5:38 AM  Result Value Ref Range   Sodium 138 135 - 145 mmol/L   Potassium 2.5 (LL) 3.5 - 5.1 mmol/L    Comment: CRITICAL RESULT CALLED TO, READ BACK BY AND VERIFIED WITH STACY CLAY AT 0656 ON 05/17/17 [place].    Chloride 108 101 - 111 mmol/L   CO2 21  (L) 22 - 32 mmol/L   Glucose, Bld 82 65 - 99 mg/dL   BUN <5 (L) 6 - 20 mg/dL   Creatinine, Ser 0.49 0.44 - 1.00 mg/dL   Calcium 7.2 (L) 8.9 - 10.3 mg/dL   GFR calc non Af Amer >60 >60 mL/min   GFR calc Af Amer >60 >60 mL/min    Comment: (NOTE) The eGFR has been calculated using the CKD EPI equation. This calculation has not been validated in all clinical situations. eGFR's persistently <60 mL/min signify possible Chronic Kidney Disease.    Anion gap 9 5 - 15    Comment: Performed at [facility], [address] [place], [place] [address]  CBC     Status: Abnormal   Collection Time: 05/17/17  5:38 AM  Result Value Ref Range   WBC 6.5 3.6 - 11.0 K/uL   RBC 4.02 3.80 - 5.20 MIL/uL   Hemoglobin 12.9 12.0 - 16.0 g/dL   HCT 37.1 35.0 - 47.0 %   MCV 92.2 80.0 - 100.0 fL  MCH 32.0 26.0 - 34.0 pg   MCHC 34.7 32.0 - 36.0 g/dL   RDW 15.2 (H) 11.5 - 14.5 %   Platelets 139 (L) 150 - 440 K/uL    Comment: Performed at [facility], [address] [place], [place] [address]  Phosphorus     Status: Abnormal   Collection Time: 05/17/17  5:38 AM  Result Value Ref Range   Phosphorus 1.4 (L) 2.5 - 4.6 mg/dL    Comment: Performed at [facility], [address] [place], [place] [address]  Magnesium     Status: None   Collection Time: 05/17/17  5:38 AM  Result Value Ref Range   Magnesium 1.8 1.7 - 2.4 mg/dL    Comment: Performed at [facility], [address] [place], [place] [address]  Basic metabolic panel     Status: Abnormal   Collection Time: 05/18/17  5:50 AM  Result Value Ref Range   Sodium 140 135 - 145 mmol/L   Potassium 3.0 (L) 3.5 - 5.1 mmol/L   Chloride 108 101 - 111 mmol/L   CO2 22 22 - 32 mmol/L   Glucose, Bld 88 65 - 99 mg/dL   BUN <5 (L) 6 - 20 mg/dL   Creatinine, Ser 0.50 0.44 - 1.00 mg/dL   Calcium 7.4 (L) 8.9 - 10.3 mg/dL   GFR calc non Af Amer >60 >60 mL/min   GFR calc Af Amer >60 >60 mL/min    Comment: (NOTE) The eGFR has  been calculated using the CKD EPI equation. This calculation has not been validated in all clinical situations. eGFR's persistently <60 mL/min signify possible Chronic Kidney Disease.    Anion gap 10 5 - 15    Comment: Performed at [facility], [address] [place], [place] [address]  Phosphorus     Status: Abnormal   Collection Time: 05/18/17  5:50 AM  Result Value Ref Range   Phosphorus 1.5 (L) 2.5 - 4.6 mg/dL    Comment: Performed at [facility], [address] [place], [place] [address]  Magnesium     Status: Abnormal   Collection Time: 05/18/17  5:50 AM  Result Value Ref Range   Magnesium 1.6 (L) 1.7 - 2.4 mg/dL    Comment: Performed at [facility], [address] [place], [place] [address]  Basic metabolic panel     Status: Abnormal   Collection Time: 05/19/17  4:53 AM  Result Value Ref Range   Sodium 140 135 - 145 mmol/L   Potassium 3.3 (L) 3.5 - 5.1 mmol/L   Chloride 108 101 - 111 mmol/L   CO2 25 22 - 32 mmol/L   Glucose, Bld 82 65 - 99 mg/dL   BUN <5 (L) 6 - 20 mg/dL   Creatinine, Ser 0.41 (L) 0.44 - 1.00 mg/dL   Calcium 7.0 (L) 8.9 - 10.3 mg/dL   GFR calc non Af Amer >60 >60 mL/min   GFR calc Af Amer >60 >60 mL/min    Comment: (NOTE) The eGFR has been calculated using the CKD EPI equation. This calculation has not been validated in all clinical situations. eGFR's persistently <60 mL/min signify possible Chronic Kidney Disease.    Anion gap 7 5 - 15    Comment: Performed at [facility], [address] [place], [place] [address]  Phosphorus     Status: Abnormal   Collection Time: 05/19/17  4:53 AM  Result Value Ref Range   Phosphorus 2.2 (L) 2.5 -  4.6 mg/dL    Comment: Performed at [facility], [address] [place], [place] [address]  Magnesium     Status: None   Collection Time: 05/19/17  4:53 AM  Result Value Ref Range   Magnesium 1.7 1.7 - 2.4 mg/dL    Comment: Performed at [facility],  [address] [place], [place] [address]  Basic metabolic panel     Status: Abnormal   Collection Time: 05/20/17  6:49 AM  Result Value Ref Range   Sodium 139 135 - 145 mmol/L   Potassium 3.1 (L) 3.5 - 5.1 mmol/L   Chloride 104 101 - 111 mmol/L   CO2 25 22 - 32 mmol/L   Glucose, Bld 82 65 - 99 mg/dL   BUN <5 (L) 6 - 20 mg/dL   Creatinine, Ser 0.38 (L) 0.44 - 1.00 mg/dL   Calcium 7.5 (L) 8.9 - 10.3 mg/dL   GFR calc non Af Amer >60 >60 mL/min   GFR calc Af Amer >60 >60 mL/min    Comment: (NOTE) The eGFR has been calculated using the CKD EPI equation. This calculation has not been validated in all clinical situations. eGFR's persistently <60 mL/min signify possible Chronic Kidney Disease.    Anion gap 10 5 - 15    Comment: Performed at [facility], [address] [place], [place] [address]  CBC     Status: Abnormal   Collection Time: 05/20/17  6:49 AM  Result Value Ref Range   WBC 7.8 3.6 - 11.0 K/uL   RBC 4.13 3.80 - 5.20 MIL/uL   Hemoglobin 12.9 12.0 - 16.0 g/dL   HCT 38.4 35.0 - 47.0 %   MCV 93.1 80.0 - 100.0 fL   MCH 31.3 26.0 - 34.0 pg   MCHC 33.6 32.0 - 36.0 g/dL   RDW 15.8 (H) 11.5 - 14.5 %   Platelets 116 (L) 150 - 440 K/uL    Comment: Performed at [facility], [address] [place], [place] [address]  Phosphorus     Status: None   Collection Time: 05/20/17  6:49 AM  Result Value Ref Range   Phosphorus 2.7 2.5 - 4.6 mg/dL    Comment: Performed at [facility], [address], [place], [place] [address]  Magnesium     Status: Abnormal   Collection Time: 05/20/17  6:49 AM  Result Value Ref Range   Magnesium 1.5 (L) 1.7 - 2.4 mg/dL    Comment: Performed at [facility], [address] [place], [place] [address]  Basic metabolic panel     Status: Abnormal   Collection Time: 05/21/17  4:09 AM  Result Value Ref Range   Sodium 138 135 - 145 mmol/L   Potassium 3.0 (L) 3.5 - 5.1 mmol/L   Chloride 104 101 - 111 mmol/L    CO2 23 22 - 32 mmol/L   Glucose, Bld 79 65 - 99 mg/dL   BUN <5 (L) 6 - 20 mg/dL   Creatinine, Ser <0.30 (L) 0.44 - 1.00 mg/dL   Calcium 7.3 (L) 8.9 - 10.3 mg/dL   GFR calc non Af Amer NOT CALCULATED >60 mL/min   GFR calc Af Amer NOT CALCULATED >60 mL/min    Comment: (NOTE) The eGFR has been calculated using the CKD EPI equation. This calculation has not been validated in all clinical situations. eGFR's persistently <60 mL/min signify possible Chronic Kidney Disease.    Anion gap 11 5 - 15    Comment: Performed  at [facility], [address], [place], [place] [address]  Magnesium     Status: Abnormal   Collection Time: 05/21/17  4:09 AM  Result Value Ref Range   Magnesium 1.6 (L) 1.7 - 2.4 mg/dL    Comment: Performed at [facility], [address] [place], [place] [address]  Creatinine, serum     Status: Abnormal   Collection Time: 05/22/17  4:51 AM  Result Value Ref Range   Creatinine, Ser 0.31 (L) 0.44 - 1.00 mg/dL   GFR calc non Af Amer >60 >60 mL/min   GFR calc Af Amer >60 >60 mL/min    Comment: (NOTE) The eGFR has been calculated using the CKD EPI equation. This calculation has not been validated in all clinical situations. eGFR's persistently <60 mL/min signify possible Chronic Kidney Disease. Performed at [facility], [address] [place], [place] [address]   Potassium     Status: None   Collection Time: 05/22/17  4:51 AM  Result Value Ref Range   Potassium 3.6 3.5 - 5.1 mmol/L    Comment: Performed at [facility], [address] [place], [place] [address]  Magnesium     Status: None   Collection Time: 05/22/17  4:51 AM  Result Value Ref Range   Magnesium 1.8 1.7 - 2.4 mg/dL    Comment: Performed at [facility], [address] [place], [place] [address]  Phosphorus     Status: Abnormal   Collection Time: 05/22/17  4:51 AM  Result Value Ref Range   Phosphorus 2.4 (L) 2.5 - 4.6 mg/dL    Comment: Performed  at [facility], [address] [place], [place] [address]  Basic metabolic panel     Status: Abnormal   Collection Time: 05/23/17  3:03 AM  Result Value Ref Range   Sodium 137 135 - 145 mmol/L   Potassium 3.1 (L) 3.5 - 5.1 mmol/L   Chloride 103 101 - 111 mmol/L   CO2 25 22 - 32 mmol/L   Glucose, Bld 82 65 - 99 mg/dL   BUN 7 6 - 20 mg/dL   Creatinine, Ser 0.35 (L) 0.44 - 1.00 mg/dL   Calcium 7.1 (L) 8.9 - 10.3 mg/dL   GFR calc non Af Amer >60 >60 mL/min   GFR calc Af Amer >60 >60 mL/min    Comment: (NOTE) The eGFR has been calculated using the CKD EPI equation. This calculation has not been validated in all clinical situations. eGFR's persistently <60 mL/min signify possible Chronic Kidney Disease.    Anion gap 9 5 - 15    Comment: Performed at [facility], [address] [place], [place] [address]  Magnesium     Status: Abnormal   Collection Time: 05/23/17  3:03 AM  Result Value Ref Range   Magnesium 1.5 (L) 1.7 - 2.4 mg/dL    Comment: Performed at [facility], [address] [place], [place] [address]  Phosphorus     Status: None   Collection Time: 05/23/17  3:03 AM  Result Value Ref Range   Phosphorus 3.2 2.5 - 4.6 mg/dL    Comment: Performed at [facility], [address] [place],  [address]  CBC     Status: Abnormal   Collection Time: 05/23/17  3:03 AM  Result Value Ref Range   WBC 7.1 3.6 - 11.0 K/uL   RBC 3.85 3.80 - 5.20 MIL/uL   Hemoglobin 12.3 12.0 - 16.0 g/dL  HCT 36.1 35.0 - 47.0 %   MCV 93.8 80.0 - 100.0 fL   MCH 31.9 26.0 - 34.0 pg   MCHC 34.1 32.0 - 36.0 g/dL   RDW 16.0 (H) 11.5 - 14.5 %   Platelets 62 (L) 150 - 440 K/uL    Comment: Performed at [facility], [address] [place], [place] [address]  Basic metabolic panel     Status: Abnormal   Collection Time: 05/24/17  5:43 AM  Result Value Ref Range   Sodium 135 135 - 145 mmol/L   Potassium 3.2 (L) 3.5 - 5.1 mmol/L   Chloride 102 101  - 111 mmol/L   CO2 25 22 - 32 mmol/L   Glucose, Bld 80 65 - 99 mg/dL   BUN 8 6 - 20 mg/dL   Creatinine, Ser <0.30 (L) 0.44 - 1.00 mg/dL   Calcium 7.0 (L) 8.9 - 10.3 mg/dL   GFR calc non Af Amer NOT CALCULATED >60 mL/min   GFR calc Af Amer NOT CALCULATED >60 mL/min    Comment: (NOTE) The eGFR has been calculated using the CKD EPI equation. This calculation has not been validated in all clinical situations. eGFR's persistently <60 mL/min signify possible Chronic Kidney Disease.    Anion gap 8 5 - 15    Comment: Performed at [facility], [address] [place], [place] [address]  Magnesium     Status: None   Collection Time: 05/24/17  5:43 AM  Result Value Ref Range   Magnesium 1.7 1.7 - 2.4 mg/dL    Comment: Performed at [facility], [address] [place], [place] [address]  Phosphorus     Status: None   Collection Time: 05/24/17  5:43 AM  Result Value Ref Range   Phosphorus 2.6 2.5 - 4.6 mg/dL    Comment: Performed at [facility], [address] [place], [place] [address]  CBC     Status: Abnormal   Collection Time: 05/24/17  8:20 AM  Result Value Ref Range   WBC 8.3 3.6 - 11.0 K/uL   RBC 3.80 3.80 - 5.20 MIL/uL   Hemoglobin 12.1 12.0 - 16.0 g/dL   HCT 35.9 35.0 - 47.0 %   MCV 94.4 80.0 - 100.0 fL   MCH 31.8 26.0 - 34.0 pg   MCHC 33.7 32.0 - 36.0 g/dL   RDW 16.1 (H) 11.5 - 14.5 %   Platelets 61 (L) 150 - 440 K/uL    Comment: Performed at [facility], [address] [place], [place] [address]  C difficile quick scan w PCR reflex     Status: None   Collection Time: 06/06/17  8:30 AM  Result Value Ref Range   C Diff antigen NEGATIVE NEGATIVE   C Diff toxin NEGATIVE NEGATIVE   C Diff interpretation No C. difficile detected.     Comment: Performed at [facility], [address] [place], [place] [address]  INR/PT     Status: Abnormal   Collection Time: 06/15/17 11:12 AM  Result Value Ref Range   INR 3.5 (H) 0.8 -  1.2    Comment: Reference interval is for non-anticoagulated patients. Suggested INR therapeutic range for Vitamin K antagonist therapy:    Standard Dose (moderate intensity                   therapeutic range):       2.0 - 3.0    Higher intensity therapeutic range  2.5 - 3.5    Prothrombin Time 33.7 (H) 9.1 - 12.0 sec    Assessment/Plan:  Hypertension, essential, benign blood pressure control important in reducing the progression of atherosclerotic disease. On appropriate oral medications.   Clostridium difficile colitis Improved but debilitated her for some time.  Hyperlipidemia with target LDL less than 100 lipid control important in reducing the progression of atherosclerotic disease. Continue statin therapy   DVT (deep venous thrombosis) (HCC) The patient reportedly had a distal vein DVT in the right lower extremity several weeks ago.  At this point, she has been maintained on anticoagulation which given her debilitated and immobile state was probably very reasonable.  I think it would be reasonable to repeat a DVT study at this time as her mobility is improving to see if we can get her off of anticoagulation.  I discussed the natural history and pathophysiology of DVT.  I have discussed the reason and rationale for treatment.  The patient should elevate her legs as tolerated and increase her activity as tolerated.  I will see her back in the next couple of weeks with a DVT study.      Leotis Pain 06/28/2017, 4:22 PM   This note was created with Dragon medical transcription system.  Any errors from dictation are unintentional.

## 2017-06-28 NOTE — Assessment & Plan Note (Signed)
blood pressure control important in reducing the progression of atherosclerotic disease. On appropriate oral medications.  

## 2017-06-28 NOTE — Patient Instructions (Signed)
Deep Vein Thrombosis Deep vein thrombosis (DVT) is a condition in which a blood clot forms in a deep vein, such as a lower leg, thigh, or arm vein. A clot is blood that has thickened into a gel or solid. This condition is dangerous. It can lead to serious and even life-threatening complications if the clot travels to the lungs and causes a blockage (pulmonary embolism). It can also damage veins in the leg. This can result in leg pain, swelling, discoloration, and sores (post-thrombotic syndrome). What are the causes? This condition may be caused by:  A slowdown of blood flow.  Damage to a vein.  A condition that makes blood clot more easily.  What increases the risk? The following factors may make you more likely to develop this condition:  Being overweight.  Being elderly, especially over age 60.  Sitting or lying down for more than four hours.  Lack of physical activity (sedentary lifestyle).  Being pregnant, giving birth, or having recently given birth.  Taking medicines that contain estrogen.  Smoking.  A history of any of the following: ? Blood clots or blood clotting disease. ? Peripheral vascular disease. ? Inflammatory bowel disease. ? Cancer. ? Heart disease. ? Genetic conditions that affect how blood clots. ? Neurological diseases that affect the legs (leg paresis). ? Injury. ? Major or lengthy surgery. ? A central line placed inside a large vein.  What are the signs or symptoms? Symptoms of this condition include:  Swelling, pain, or tenderness in an arm or leg.  Warmth, redness, or discoloration in an arm or leg.  If the clot is in your leg, symptoms may be more noticeable or worse when you stand or walk. Some people do not have any symptoms. How is this diagnosed? This condition is diagnosed with:  A medical history.  A physical exam.  Tests, such as: ? Blood tests. These are done to see how your blood clots. ? Imaging tests. These are done to  check for clots. Tests may include:  Ultrasound.  CT scan.  MRI.  X-ray.  Venogram. For this test, X-rays are taken after a dye is injected into a vein.  How is this treated? Treatment for this condition depends on the cause, your risk for bleeding or developing more clots, and any medical conditions you have. Treatment may include:  Taking blood thinners (also called anticoagulants). These medicines may be taken by mouth, injected under the skin, or injected through an IV tube (catheter). These medicines prevent clots from forming.  Injecting medicine that dissolves blood clots into the affected vein (catheter-directed thrombolysis).  Having surgery. Surgery may be done to: ? Remove the clot. ? Place a filter in a large vein to catch blood clots before they reach the lungs.  Some treatments may be continued for up to six months. Follow these instructions at home: If you are taking an oral blood thinner:  Take the medicine exactly as told by your health care provider. Some blood thinners need to be taken at the same time every day. Do not skip a dose.  Ask your health care provider about what foods and drugs interact with the medicine.  Ask about possible side effects. General instructions  Blood thinners can cause easy bruising and difficulty stopping bleeding. Because of this, if you are taking or were given a blood thinner: ? Hold pressure over cuts for longer than usual. ? Tell your dentist and other health care providers that you are taking blood thinners before   having any procedures that can cause bleeding. ? Avoid contact sports.  Take over-the-counter and prescription medicines only as told by your health care provider.  Return to your normal activities as told by your health care provider. Ask your health care provider what activities are safe for you.  Wear compression stockings if recommended by your health care provider.  Keep all follow-up visits as told by  your health care provider. This is important. How is this prevented? To lower your risk of developing this condition again:  For 30 or more minutes every day, do an activity that: ? Involves moving your arms and legs. ? Increases your heart rate.  When traveling for longer than four hours: ? Exercise your arms and legs every hour. ? Drink plenty of water. ? Avoid drinking alcohol.  Avoid sitting or lying for a long time without moving your legs.  Stay a healthy weight.  If you are a woman who is older than age 35, avoid unnecessary use of medicines that contain estrogen.  Do not use any products that contain nicotine or tobacco, such as cigarettes and e-cigarettes. This is especially important if you take estrogen medicines. If you need help quitting, ask your health care provider.  Contact a health care provider if:  You miss a dose of your blood thinner.  You have nausea, vomiting, or diarrhea that lasts for more than one day.  Your menstrual period is heavier than usual.  You have unusual bruising. Get help right away if:  You have new or increased pain, swelling, or redness in an arm or leg.  You have numbness or tingling in an arm or leg.  You have shortness of breath.  You have chest pain.  You have a rapid or irregular heartbeat.  You feel light-headed or dizzy.  You cough up blood.  There is blood in your vomit, stool, or urine.  You have a serious fall or accident, or you hit your head.  You have a severe headache or confusion.  You have a cut that will not stop bleeding. These symptoms may represent a serious problem that is an emergency. Do not wait to see if the symptoms will go away. Get medical help right away. Call your local emergency services (911 in the [place] Do not drive yourself to the hospital. Summary  DVT is a condition in which a blood clot forms in a deep vein, such as a lower leg, thigh, or arm vein.  Symptoms can include swelling,  warmth, pain, and redness in your leg or arm.  Treatment may include taking blood thinners, injecting medicine that dissolves blood clots,wearing compression stockings, or surgery.  If you are prescribed blood thinners, take them exactly as told. This information is not intended to replace advice given to you by your health care provider. Make sure you discuss any questions you have with your health care provider. Document Released: 03/22/2005 Document Revised: 04/24/2016 Document Reviewed: 04/24/2016 Elsevier Interactive Patient Education  2018 [employer]  

## 2017-06-28 NOTE — Assessment & Plan Note (Signed)
lipid control important in reducing the progression of atherosclerotic disease. Continue statin therapy  

## 2017-06-28 NOTE — Telephone Encounter (Signed)
Pt with pt and advised her Dr. Dorothey Baseman recommendation for her hemorrhoids.

## 2017-06-29 DIAGNOSIS — G20 Parkinson's disease: Secondary | ICD-10-CM | POA: Diagnosis not present

## 2017-06-29 DIAGNOSIS — A0472 Enterocolitis due to Clostridium difficile, not specified as recurrent: Secondary | ICD-10-CM | POA: Diagnosis not present

## 2017-06-29 DIAGNOSIS — E43 Unspecified severe protein-calorie malnutrition: Secondary | ICD-10-CM | POA: Diagnosis not present

## 2017-06-29 DIAGNOSIS — G4733 Obstructive sleep apnea (adult) (pediatric): Secondary | ICD-10-CM | POA: Diagnosis not present

## 2017-06-29 DIAGNOSIS — J45909 Unspecified asthma, uncomplicated: Secondary | ICD-10-CM | POA: Diagnosis not present

## 2017-06-29 DIAGNOSIS — M172 Bilateral post-traumatic osteoarthritis of knee: Secondary | ICD-10-CM | POA: Diagnosis not present

## 2017-06-29 DIAGNOSIS — I471 Supraventricular tachycardia: Secondary | ICD-10-CM | POA: Diagnosis not present

## 2017-06-29 DIAGNOSIS — I10 Essential (primary) hypertension: Secondary | ICD-10-CM | POA: Diagnosis not present

## 2017-06-29 DIAGNOSIS — E785 Hyperlipidemia, unspecified: Secondary | ICD-10-CM | POA: Diagnosis not present

## 2017-06-29 DIAGNOSIS — M5136 Other intervertebral disc degeneration, lumbar region: Secondary | ICD-10-CM | POA: Diagnosis not present

## 2017-06-30 DIAGNOSIS — J45909 Unspecified asthma, uncomplicated: Secondary | ICD-10-CM | POA: Diagnosis not present

## 2017-06-30 DIAGNOSIS — E785 Hyperlipidemia, unspecified: Secondary | ICD-10-CM | POA: Diagnosis not present

## 2017-06-30 DIAGNOSIS — G4733 Obstructive sleep apnea (adult) (pediatric): Secondary | ICD-10-CM | POA: Diagnosis not present

## 2017-06-30 DIAGNOSIS — E43 Unspecified severe protein-calorie malnutrition: Secondary | ICD-10-CM | POA: Diagnosis not present

## 2017-06-30 DIAGNOSIS — A0472 Enterocolitis due to Clostridium difficile, not specified as recurrent: Secondary | ICD-10-CM | POA: Diagnosis not present

## 2017-06-30 DIAGNOSIS — I471 Supraventricular tachycardia: Secondary | ICD-10-CM | POA: Diagnosis not present

## 2017-06-30 DIAGNOSIS — M5136 Other intervertebral disc degeneration, lumbar region: Secondary | ICD-10-CM | POA: Diagnosis not present

## 2017-06-30 DIAGNOSIS — G20 Parkinson's disease: Secondary | ICD-10-CM | POA: Diagnosis not present

## 2017-06-30 DIAGNOSIS — I10 Essential (primary) hypertension: Secondary | ICD-10-CM | POA: Diagnosis not present

## 2017-06-30 DIAGNOSIS — M172 Bilateral post-traumatic osteoarthritis of knee: Secondary | ICD-10-CM | POA: Diagnosis not present

## 2017-07-01 ENCOUNTER — Telehealth: Payer: Self-pay | Admitting: Family Medicine

## 2017-07-01 NOTE — Telephone Encounter (Signed)
Dr. Ronnald Ramp here is an update from the pt she'd like for me to relay to you. Please call her if you have questions or comments on her home Phone #  [phone] Pt stated that she has an appt set up for a Consult about banding her for hemorrhoid  on 4/12 but her hemorrhoids will need to calm down in order for them to do that procedure. Has a new prescription her husband picked up today b/c the pain is very bad and they are sticking out it is an Rx that contains Lidocaine.  Having a lot of difficulty with the pain from hemmorrhoid b/c she can't sit or lay and she is doing a lot of standing and her physical therapist has not given her permission to walk yet  Dr. Lucky Cowboy said color in leg is good and no excessive swelling anticipation ultrasound on 5/10 will be discontinued warfarin probably b/c of left in bed many days without moving   Pt stated that she is overall feeling well, eating better, not constipated tries to include 12-15 grams of fiber a day and drinking a lot of water.  Jill Alexanders

## 2017-07-04 DIAGNOSIS — M172 Bilateral post-traumatic osteoarthritis of knee: Secondary | ICD-10-CM | POA: Diagnosis not present

## 2017-07-04 DIAGNOSIS — M5136 Other intervertebral disc degeneration, lumbar region: Secondary | ICD-10-CM | POA: Diagnosis not present

## 2017-07-04 DIAGNOSIS — I471 Supraventricular tachycardia: Secondary | ICD-10-CM | POA: Diagnosis not present

## 2017-07-04 DIAGNOSIS — G4733 Obstructive sleep apnea (adult) (pediatric): Secondary | ICD-10-CM | POA: Diagnosis not present

## 2017-07-04 DIAGNOSIS — E785 Hyperlipidemia, unspecified: Secondary | ICD-10-CM | POA: Diagnosis not present

## 2017-07-04 DIAGNOSIS — J45909 Unspecified asthma, uncomplicated: Secondary | ICD-10-CM | POA: Diagnosis not present

## 2017-07-04 DIAGNOSIS — G20 Parkinson's disease: Secondary | ICD-10-CM | POA: Diagnosis not present

## 2017-07-04 DIAGNOSIS — A0472 Enterocolitis due to Clostridium difficile, not specified as recurrent: Secondary | ICD-10-CM | POA: Diagnosis not present

## 2017-07-04 DIAGNOSIS — I10 Essential (primary) hypertension: Secondary | ICD-10-CM | POA: Diagnosis not present

## 2017-07-04 DIAGNOSIS — E43 Unspecified severe protein-calorie malnutrition: Secondary | ICD-10-CM | POA: Diagnosis not present

## 2017-07-05 DIAGNOSIS — M172 Bilateral post-traumatic osteoarthritis of knee: Secondary | ICD-10-CM | POA: Diagnosis not present

## 2017-07-05 DIAGNOSIS — M5136 Other intervertebral disc degeneration, lumbar region: Secondary | ICD-10-CM | POA: Diagnosis not present

## 2017-07-05 DIAGNOSIS — E785 Hyperlipidemia, unspecified: Secondary | ICD-10-CM | POA: Diagnosis not present

## 2017-07-05 DIAGNOSIS — J45909 Unspecified asthma, uncomplicated: Secondary | ICD-10-CM | POA: Diagnosis not present

## 2017-07-05 DIAGNOSIS — I10 Essential (primary) hypertension: Secondary | ICD-10-CM | POA: Diagnosis not present

## 2017-07-05 DIAGNOSIS — G4733 Obstructive sleep apnea (adult) (pediatric): Secondary | ICD-10-CM | POA: Diagnosis not present

## 2017-07-05 DIAGNOSIS — E43 Unspecified severe protein-calorie malnutrition: Secondary | ICD-10-CM | POA: Diagnosis not present

## 2017-07-05 DIAGNOSIS — G20 Parkinson's disease: Secondary | ICD-10-CM | POA: Diagnosis not present

## 2017-07-05 DIAGNOSIS — A0472 Enterocolitis due to Clostridium difficile, not specified as recurrent: Secondary | ICD-10-CM | POA: Diagnosis not present

## 2017-07-05 DIAGNOSIS — I471 Supraventricular tachycardia: Secondary | ICD-10-CM | POA: Diagnosis not present

## 2017-07-06 ENCOUNTER — Other Ambulatory Visit: Payer: Self-pay

## 2017-07-06 ENCOUNTER — Telehealth: Payer: Self-pay

## 2017-07-06 DIAGNOSIS — N390 Urinary tract infection, site not specified: Secondary | ICD-10-CM | POA: Diagnosis not present

## 2017-07-06 MED ORDER — NITROFURANTOIN MONOHYD MACRO 100 MG PO CAPS: 100.0000 mg | ORAL_CAPSULE | Freq: Two times a day (BID) | ORAL | 0 refills | Status: DC

## 2017-07-06 NOTE — Telephone Encounter (Signed)
Lindsay Stephenson from [facility] called to say a urine sample was collected and sent off for culture today, but it was the "worst urine she has seen." We started Macrobid BID and will see on Friday @ 10:30 or 3:00

## 2017-07-07 DIAGNOSIS — I471 Supraventricular tachycardia: Secondary | ICD-10-CM | POA: Diagnosis not present

## 2017-07-07 DIAGNOSIS — M5136 Other intervertebral disc degeneration, lumbar region: Secondary | ICD-10-CM | POA: Diagnosis not present

## 2017-07-07 DIAGNOSIS — E43 Unspecified severe protein-calorie malnutrition: Secondary | ICD-10-CM | POA: Diagnosis not present

## 2017-07-07 DIAGNOSIS — A0472 Enterocolitis due to Clostridium difficile, not specified as recurrent: Secondary | ICD-10-CM | POA: Diagnosis not present

## 2017-07-07 DIAGNOSIS — G4733 Obstructive sleep apnea (adult) (pediatric): Secondary | ICD-10-CM | POA: Diagnosis not present

## 2017-07-07 DIAGNOSIS — I10 Essential (primary) hypertension: Secondary | ICD-10-CM | POA: Diagnosis not present

## 2017-07-07 DIAGNOSIS — E785 Hyperlipidemia, unspecified: Secondary | ICD-10-CM | POA: Diagnosis not present

## 2017-07-07 DIAGNOSIS — G20 Parkinson's disease: Secondary | ICD-10-CM | POA: Diagnosis not present

## 2017-07-07 DIAGNOSIS — J45909 Unspecified asthma, uncomplicated: Secondary | ICD-10-CM | POA: Diagnosis not present

## 2017-07-07 DIAGNOSIS — M172 Bilateral post-traumatic osteoarthritis of knee: Secondary | ICD-10-CM | POA: Diagnosis not present

## 2017-07-08 ENCOUNTER — Other Ambulatory Visit: Payer: Self-pay

## 2017-07-08 ENCOUNTER — Encounter: Payer: Self-pay | Admitting: Family Medicine

## 2017-07-08 ENCOUNTER — Ambulatory Visit: Payer: Medicare HMO | Admitting: Family Medicine

## 2017-07-08 VITALS — BP 126/80 | HR 72 | Temp 98.0°F | Ht 60.0 in | Wt 168.0 lb

## 2017-07-08 DIAGNOSIS — D518 Other vitamin B12 deficiency anemias: Secondary | ICD-10-CM | POA: Diagnosis not present

## 2017-07-08 DIAGNOSIS — N390 Urinary tract infection, site not specified: Secondary | ICD-10-CM | POA: Diagnosis not present

## 2017-07-08 DIAGNOSIS — D519 Vitamin B12 deficiency anemia, unspecified: Secondary | ICD-10-CM | POA: Diagnosis not present

## 2017-07-08 MED ORDER — CYANOCOBALAMIN 1000 MCG/ML IJ SOLN
1000.0000 ug | Freq: Once | INTRAMUSCULAR | Status: AC
  Administered 2017-07-08: 1000 ug via INTRAMUSCULAR

## 2017-07-08 MED ORDER — HYDROCORTISONE 2.5 % RE CREA: 1.0000 "application " | TOPICAL_CREAM | Freq: Two times a day (BID) | RECTAL | 1 refills | Status: DC

## 2017-07-08 NOTE — Progress Notes (Signed)
Name: Lindsay Stephenson   MRN: [id number]    DOB: [birth year]   Date:07/08/2017       Progress Note  Subjective  Chief Complaint  Chief Complaint  Patient presents with  . Urinary Tract Infection    feels like symptoms are improving, burning is gone completely    Urinary Tract Infection   This is a new problem. The current episode started in the past 7 days. The problem occurs intermittently. The problem has been gradually improving. The quality of the pain is described as burning. The pain is mild. There has been no fever. Pertinent negatives include no chills, discharge, flank pain, frequency, hematuria, hesitancy, nausea, sweats, urgency or vomiting. The treatment provided mild relief.    No problem-specific Assessment & Plan notes found for this encounter.   Past Medical History:  Diagnosis Date  . Allergy   . Anemia    TAKING IRON  . Arthritis    KNEES AND HANDS  . Asthma    COUGHING, NO ATTACKS IN A YEAR OR 2  . Dyspnea    ON EXERTION  . GERD (gastroesophageal reflux disease)   . HOH (hard of hearing)    30-35 % HEARING LOSS  . Hyperlipidemia   . Hypertension    HX OF  . LABD (linear immunoglobulin A bullous dermatosis)   . Mitral valve prolapse   . Mitral valve prolapse   . 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 Parkinson's disease ([place])   . Rectal bleeding   . Sleep apnea    CPAP  . Vertigo     Past Surgical History:  Procedure Laterality Date  . ABDOMINAL HYSTERECTOMY    . APPENDECTOMY    . COLONOSCOPY WITH PROPOFOL N/A 01/16/2016   Procedure: COLONOSCOPY WITH PROPOFOL;  Surgeon: Lucilla Lame, MD;  Location: [facility];  Service: Endoscopy;  Laterality: N/A;  CPAP  . KNEE SURGERY     2013 and 2014  . POLYPECTOMY  01/16/2016   Procedure: POLYPECTOMY;  Surgeon: Lucilla Lame, MD;  Location: [facility];  Service: Endoscopy;;  . REPLACEMENT TOTAL KNEE BILATERAL Bilateral    2013, 2014  . TONSILLECTOMY      ADENOIDECTOMY    Family History  Problem Relation Age of Onset  . Osteoporosis Mother   . Chronic Renal Failure Father   . Arthritis/Rheumatoid Sister   . Stroke Sister   . Fibromyalgia Sister   . Breast cancer Sister 49  . Diabetes Brother     Social History   Socioeconomic History  . Marital status: Married    Spouse name: Not on file  . Number of children: Not on file  . Years of education: Not on file  . Highest education level: Not on file  Occupational History  . Occupation: retired    Comment: high school french  Social Needs  . Financial resource strain: Not on file  . Food insecurity:    Worry: Not on file    Inability: Not on file  . Transportation needs:    Medical: Not on file    Non-medical: Not on file  Tobacco Use  . Smoking status: Former Smoker    Years: 3.00    Types: Cigarettes    Last attempt to quit: 1969    Years since quitting: 50.2  . Smokeless tobacco: Never Used  Substance and Sexual Activity  . Alcohol use: No    Comment: 4-5 times a year  . Drug  use: No  . Sexual activity: Not Currently  Lifestyle  . Physical activity:    Days per week: Not on file    Minutes per session: Not on file  . Stress: Not on file  Relationships  . Social connections:    Talks on phone: Not on file    Gets together: Not on file    Attends religious service: Not on file    Active member of club or organization: Not on file    Attends meetings of clubs or organizations: Not on file    Relationship status: Not on file  . Intimate partner violence:    Fear of current or ex partner: Not on file    Emotionally abused: Not on file    Physically abused: Not on file    Forced sexual activity: Not on file  Other Topics Concern  . Not on file  Social History Narrative   She is a retired Camera operator.   She lives with husband and son in a one story home.  Has 2 children.   Moved here from [place].   Highest level of education:  Masters     Allergies   Allergen Reactions  . Entex Lq [Phenylephrine-Guaifenesin] Swelling    TONGUE  . Phenylephrine-Guaifenesin Swelling  . Amoxicillin-Pot Clavulanate     Other reaction(s): Other (See Comments) Loss of appetite, weakness  . Codeine Hives and Other (See Comments)    Pt reports only having symptoms after receiving large amounts of medication  . Sulfa Antibiotics Itching  . Tape Other (See Comments)    CAN PULL OFF LAYER OF SKIN    Outpatient Medications Prior to Visit  Medication Sig Dispense Refill  . acetaminophen (TYLENOL) 325 MG tablet Take 650 mg by mouth.    Marland Kitchen albuterol (PROVENTIL HFA;VENTOLIN HFA) 108 (90 Base) MCG/ACT inhaler Inhale 1-2 puffs into the lungs every 6 (six) hours as needed for wheezing or shortness of breath. 1 Inhaler 1  . AMBULATORY NON FORMULARY MEDICATION 1 Units by Other route daily. Walk in bathtub/shower 1 Units 0  . Calcium Carb-Ergocalciferol 250-125 MG-UNIT TABS Take by mouth.    . Calcium Carbonate-Vit D-Min (CALCIUM 1200 PO) Take by mouth.    . folic acid (FOLVITE) 1 MG tablet Take 1 mg by mouth daily.     . hydrocortisone (ANUSOL-HC) 2.5 % rectal cream Place 1 application rectally 2 (two) times daily. 30 g 1  . Hydrocortisone (GERHARDT'S BUTT CREAM) CREA Apply 1 application topically daily. 1 each 0  . levocetirizine (XYZAL) 5 MG tablet Take 1 tablet (5 mg total) by mouth as needed. 90 tablet 1  . Melatonin 3 MG TABS Take 3 mg by mouth.    . methotrexate 2.5 MG tablet Take 10 mg by mouth once a week. Takes on Fridays    . Multiple Vitamin (MULTIVITAMIN) capsule Take 1 capsule by mouth daily.    . nitrofurantoin, macrocrystal-monohydrate, (MACROBID) 100 MG capsule Take 1 capsule (100 mg total) by mouth 2 (two) times daily. 14 capsule 0  . omeprazole (PRILOSEC) 20 MG capsule Take 1 capsule (20 mg total) by mouth daily. 90 capsule 3  . potassium chloride SA (K-DUR,KLOR-CON) 20 MEQ tablet Take 1 tablet (20 mEq total) by mouth 2 (two) times daily. 60 tablet 1   . predniSONE (DELTASONE) 5 MG tablet Take 10 mg by mouth.     . Probiotic Product (TRUNATURE PROBIOTIC FOR KIDS) CHEW Chew 1 each by mouth daily.    Marland Kitchen warfarin (COUMADIN)  4 MG tablet Take 1 tablet (4 mg total) by mouth daily. 30 tablet 0  . vancomycin (VANCOCIN) 125 MG capsule      No facility-administered medications prior to visit.     Review of Systems  Constitutional: Negative for chills, fever, malaise/fatigue and weight loss.  HENT: Negative for ear discharge, ear pain and sore throat.   Eyes: Negative for blurred vision.  Respiratory: Negative for cough, sputum production, shortness of breath and wheezing.   Cardiovascular: Negative for chest pain, palpitations and leg swelling.  Gastrointestinal: Negative for abdominal pain, blood in stool, constipation, diarrhea, heartburn, melena, nausea and vomiting.  Genitourinary: Negative for dysuria, flank pain, frequency, hematuria, hesitancy and urgency.  Musculoskeletal: Negative for back pain, joint pain, myalgias and neck pain.  Skin: Negative for rash.  Neurological: Negative for dizziness, tingling, sensory change, focal weakness and headaches.  Endo/Heme/Allergies: Negative for environmental allergies and polydipsia. Does not bruise/bleed easily.  Psychiatric/Behavioral: Negative for depression and suicidal ideas. The patient is not nervous/anxious and does not have insomnia.      Objective  Vitals:   07/08/17 1457  BP: 126/80  Pulse: 72  Temp: 98 F (36.7 C)  TempSrc: Oral  Weight: 168 lb (76.2 kg)  Height: 5' (1.524 m)    Physical Exam  Constitutional: She is well-developed, well-nourished, and in no distress. No distress.  HENT:  Head: Normocephalic and atraumatic.  Right Ear: External ear normal.  Left Ear: External ear normal.  Nose: Nose normal.  Mouth/Throat: Oropharynx is clear and moist.  Eyes: Pupils are equal, round, and reactive to light. Conjunctivae and EOM are normal. Right eye exhibits no  discharge. Left eye exhibits no discharge.  Neck: Normal range of motion. Neck supple. No JVD present. No thyromegaly present.  Cardiovascular: Normal rate, regular rhythm, normal heart sounds and intact distal pulses. Exam reveals no gallop and no friction rub.  No murmur heard. Pulmonary/Chest: Effort normal and breath sounds normal. She has no wheezes. She has no rales.  Abdominal: Soft. Bowel sounds are normal. She exhibits no mass. There is no tenderness. There is no guarding.  Musculoskeletal: Normal range of motion. She exhibits no edema.  Lymphadenopathy:    She has no cervical adenopathy.  Neurological: She is alert.  Skin: Skin is warm and dry. Rash noted. She is not diaphoretic.  Psychiatric: Mood and affect normal.  Nursing note and vitals reviewed.     Assessment & Plan  Problem List Items Addressed This Visit    None    Visit Diagnoses    Urinary tract infection without hematuria, site unspecified    -  Primary   follow up   Anemia due to vitamin B12 deficiency, unspecified B12 deficiency type       Relevant Medications   cyanocobalamin ((VITAMIN B-12)) injection 1,000 mcg (Completed) (Start on 07/08/2017  4:15 PM)   Other Relevant Orders   CBC with Differential/Platelet   Other vitamin B12 deficiency anemia       Relevant Medications   cyanocobalamin ((VITAMIN B-12)) injection 1,000 mcg (Completed) (Start on 07/08/2017  4:15 PM)      Meds ordered this encounter  Medications  . cyanocobalamin ((VITAMIN B-12)) injection 1,000 mcg      Dr. Otilio Miu [facility]  07/08/17

## 2017-07-09 LAB — CBC WITH DIFFERENTIAL/PLATELET
BASOS ABS: 0 10*3/uL (ref 0.0–0.2)
Basos: 0 %
EOS (ABSOLUTE): 0 10*3/uL (ref 0.0–0.4)
EOS: 0 %
HEMATOCRIT: 42.7 % (ref 34.0–46.6)
HEMOGLOBIN: 14.3 g/dL (ref 11.1–15.9)
IMMATURE GRANS (ABS): 0 10*3/uL (ref 0.0–0.1)
Immature Granulocytes: 0 %
LYMPHS: 8 %
Lymphocytes Absolute: 0.8 10*3/uL (ref 0.7–3.1)
MCH: 33.3 pg — AB (ref 26.6–33.0)
MCHC: 33.5 g/dL (ref 31.5–35.7)
MCV: 99 fL — ABNORMAL HIGH (ref 79–97)
MONOCYTES: 3 %
Monocytes Absolute: 0.3 10*3/uL (ref 0.1–0.9)
Neutrophils Absolute: 9.2 10*3/uL — ABNORMAL HIGH (ref 1.4–7.0)
Neutrophils: 89 %
Platelets: 307 10*3/uL (ref 150–379)
RBC: 4.3 x10E6/uL (ref 3.77–5.28)
RDW: 17.2 % — ABNORMAL HIGH (ref 12.3–15.4)
WBC: 10.3 10*3/uL (ref 3.4–10.8)

## 2017-07-11 DIAGNOSIS — J45909 Unspecified asthma, uncomplicated: Secondary | ICD-10-CM | POA: Diagnosis not present

## 2017-07-11 DIAGNOSIS — A0472 Enterocolitis due to Clostridium difficile, not specified as recurrent: Secondary | ICD-10-CM | POA: Diagnosis not present

## 2017-07-11 DIAGNOSIS — G4733 Obstructive sleep apnea (adult) (pediatric): Secondary | ICD-10-CM | POA: Diagnosis not present

## 2017-07-11 DIAGNOSIS — M172 Bilateral post-traumatic osteoarthritis of knee: Secondary | ICD-10-CM | POA: Diagnosis not present

## 2017-07-11 DIAGNOSIS — I471 Supraventricular tachycardia: Secondary | ICD-10-CM | POA: Diagnosis not present

## 2017-07-11 DIAGNOSIS — E43 Unspecified severe protein-calorie malnutrition: Secondary | ICD-10-CM | POA: Diagnosis not present

## 2017-07-11 DIAGNOSIS — M5136 Other intervertebral disc degeneration, lumbar region: Secondary | ICD-10-CM | POA: Diagnosis not present

## 2017-07-11 DIAGNOSIS — G20 Parkinson's disease: Secondary | ICD-10-CM | POA: Diagnosis not present

## 2017-07-11 DIAGNOSIS — I10 Essential (primary) hypertension: Secondary | ICD-10-CM | POA: Diagnosis not present

## 2017-07-11 DIAGNOSIS — E785 Hyperlipidemia, unspecified: Secondary | ICD-10-CM | POA: Diagnosis not present

## 2017-07-12 DIAGNOSIS — M172 Bilateral post-traumatic osteoarthritis of knee: Secondary | ICD-10-CM | POA: Diagnosis not present

## 2017-07-12 DIAGNOSIS — A0472 Enterocolitis due to Clostridium difficile, not specified as recurrent: Secondary | ICD-10-CM | POA: Diagnosis not present

## 2017-07-12 DIAGNOSIS — G4733 Obstructive sleep apnea (adult) (pediatric): Secondary | ICD-10-CM | POA: Diagnosis not present

## 2017-07-12 DIAGNOSIS — I10 Essential (primary) hypertension: Secondary | ICD-10-CM | POA: Diagnosis not present

## 2017-07-12 DIAGNOSIS — J45909 Unspecified asthma, uncomplicated: Secondary | ICD-10-CM | POA: Diagnosis not present

## 2017-07-12 DIAGNOSIS — E43 Unspecified severe protein-calorie malnutrition: Secondary | ICD-10-CM | POA: Diagnosis not present

## 2017-07-12 DIAGNOSIS — M5136 Other intervertebral disc degeneration, lumbar region: Secondary | ICD-10-CM | POA: Diagnosis not present

## 2017-07-12 DIAGNOSIS — E785 Hyperlipidemia, unspecified: Secondary | ICD-10-CM | POA: Diagnosis not present

## 2017-07-12 DIAGNOSIS — I471 Supraventricular tachycardia: Secondary | ICD-10-CM | POA: Diagnosis not present

## 2017-07-12 DIAGNOSIS — G20 Parkinson's disease: Secondary | ICD-10-CM | POA: Diagnosis not present

## 2017-07-13 ENCOUNTER — Other Ambulatory Visit: Payer: Self-pay

## 2017-07-13 ENCOUNTER — Encounter: Payer: Self-pay | Admitting: Gastroenterology

## 2017-07-13 MED ORDER — WARFARIN SODIUM 4 MG PO TABS: 4.0000 mg | ORAL_TABLET | Freq: Every day | ORAL | 0 refills | Status: DC

## 2017-07-13 NOTE — Telephone Encounter (Signed)
Error

## 2017-07-14 DIAGNOSIS — E43 Unspecified severe protein-calorie malnutrition: Secondary | ICD-10-CM | POA: Diagnosis not present

## 2017-07-14 DIAGNOSIS — G20 Parkinson's disease: Secondary | ICD-10-CM | POA: Diagnosis not present

## 2017-07-14 DIAGNOSIS — G4733 Obstructive sleep apnea (adult) (pediatric): Secondary | ICD-10-CM | POA: Diagnosis not present

## 2017-07-14 DIAGNOSIS — J45909 Unspecified asthma, uncomplicated: Secondary | ICD-10-CM | POA: Diagnosis not present

## 2017-07-14 DIAGNOSIS — I10 Essential (primary) hypertension: Secondary | ICD-10-CM | POA: Diagnosis not present

## 2017-07-14 DIAGNOSIS — M5136 Other intervertebral disc degeneration, lumbar region: Secondary | ICD-10-CM | POA: Diagnosis not present

## 2017-07-14 DIAGNOSIS — A0472 Enterocolitis due to Clostridium difficile, not specified as recurrent: Secondary | ICD-10-CM | POA: Diagnosis not present

## 2017-07-14 DIAGNOSIS — M172 Bilateral post-traumatic osteoarthritis of knee: Secondary | ICD-10-CM | POA: Diagnosis not present

## 2017-07-14 DIAGNOSIS — I471 Supraventricular tachycardia: Secondary | ICD-10-CM | POA: Diagnosis not present

## 2017-07-14 DIAGNOSIS — E785 Hyperlipidemia, unspecified: Secondary | ICD-10-CM | POA: Diagnosis not present

## 2017-07-15 ENCOUNTER — Encounter: Payer: Self-pay | Admitting: Gastroenterology

## 2017-07-15 ENCOUNTER — Ambulatory Visit: Payer: Medicare HMO | Admitting: Gastroenterology

## 2017-07-15 ENCOUNTER — Other Ambulatory Visit: Payer: Self-pay

## 2017-07-15 VITALS — BP 131/94 | HR 120 | Wt 168.0 lb

## 2017-07-15 DIAGNOSIS — K5100 Ulcerative (chronic) pancolitis without complications: Secondary | ICD-10-CM | POA: Diagnosis not present

## 2017-07-15 DIAGNOSIS — K642 Third degree hemorrhoids: Secondary | ICD-10-CM

## 2017-07-15 MED ORDER — OLANZAPINE 2.5 MG PO TABS: 2.5000 mg | ORAL_TABLET | Freq: Every day | ORAL | 0 refills | Status: DC

## 2017-07-15 NOTE — Addendum Note (Signed)
Addended by: Fredderick Severance on: 07/15/2017 03:46 PM   Modules accepted: Level of Service

## 2017-07-15 NOTE — Progress Notes (Signed)
Lindsay Darby, MD [address]  [place]  [place], [place] [address]  Main: [phone]  Fax: [phone]    Gastroenterology Consultation  Referring Provider:     Juline Patch, MD Primary Care Physician:  Juline Patch, MD Primary Gastroenterologist:  Dr. Allen Norris Reason for Consultation:     Severe symptomatic hemorrhoids        HPI:   Lindsay Stephenson is a 72 y.o. female referred by Dr. Juline Patch, MD  for consultation & management of severe symptomatic hemorrhoids. Patient has multiple comorbidities including LABD which is currently in remission on weekly methotrexate and folate, had severe C. Difficile colitis, admitted to [facility] in 05/2017, subsequently developed severe malnutrition, deconditioning and severe peripheral neuropathy, wheelchair bound. Patient is very depressed and tearful in clinic today as she was fully functioning and independent of her ADLs prior to C. Difficile infection. This has resulted in severe debilitation and dependent on her husband who is taking care of her at home. patient has been undergoing physical therapy since discharge from [facility] and recuperating slowly, currently able to walk 20-30 steps at a time using a walker. She is concerned about severe symptoms from hemorrhoids since C. Difficile colitis. She was originally seen by Dr. Allen Norris on 06/23/2017 due to ongoing hemorrhoidal symptoms. Predominantly severe burning pain in the perianal and rectal area associated with itching, pressure, prolapse and blood on wiping. About 2 weeks ago, we started her on topical nitroglycerin with lidocaine with a plan to follow up with me in hemorrhoid clinic to discuss about ligation. She is accompanied by her husband and son today. Overall, her hemorrhoid symptoms are 50% better with topical nitroglycerin plus Lidocaine and Anusol. She is currently having 2-3, pudding-like consistency stools, nonbloody. She has  added more fiber in her diet. She denies abdominal pain, nausea, vomiting. She also has history of DVT, developed after last hospitalization, currently on Coumadin. She is followed by Dr. Leotis Pain for the management of DVT.   NSAIDs: none  Antiplts/Anticoagulants/Anti thrombotics: warfarin for history of recent DVT  GI Procedures:  Colonoscopy 01/16/2016 - One 6 mm polyp in the transverse colon, removed with a cold snare. Resected and retrieved. - Diverticulosis in the entire examined colon. - Patchy moderate inflammation was found in the entire examined colon secondary to colitis. Biopsied. - The examined portion of the ileum was normal. Biopsied. Pathology revealed chronic moderate active colitis  Past Medical History:  Diagnosis Date  . Allergy   . Anemia    TAKING IRON  . Arthritis    KNEES AND HANDS  . Asthma    COUGHING, NO ATTACKS IN A YEAR OR 2  . Dyspnea    ON EXERTION  . GERD (gastroesophageal reflux disease)   . HOH (hard of hearing)    30-35 % HEARING LOSS  . Hyperlipidemia   . Hypertension    HX OF  . LABD (linear immunoglobulin A bullous dermatosis)   . Mitral valve prolapse   . Mitral valve prolapse   . 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 Parkinson's disease ([place])   . Rectal bleeding   . Sleep apnea    CPAP  . Vertigo     Past Surgical History:  Procedure Laterality Date  . ABDOMINAL HYSTERECTOMY    . APPENDECTOMY    . COLONOSCOPY WITH PROPOFOL N/A 01/16/2016   Procedure: COLONOSCOPY WITH PROPOFOL;  Surgeon: Evangeline Gula  Allen Norris, MD;  Location: [facility];  Service: Endoscopy;  Laterality: N/A;  CPAP  . KNEE SURGERY     2013 and 2014  . POLYPECTOMY  01/16/2016   Procedure: POLYPECTOMY;  Surgeon: Lucilla Lame, MD;  Location: [facility];  Service: Endoscopy;;  . REPLACEMENT TOTAL KNEE BILATERAL Bilateral    2013, 2014  . TONSILLECTOMY     ADENOIDECTOMY     Current Outpatient Medications:  .   acetaminophen (TYLENOL) 325 MG tablet, Take 650 mg by mouth., Disp: , Rfl:  .  albuterol (PROVENTIL HFA;VENTOLIN HFA) 108 (90 Base) MCG/ACT inhaler, Inhale 1-2 puffs into the lungs every 6 (six) hours as needed for wheezing or shortness of breath., Disp: 1 Inhaler, Rfl: 1 .  AMBULATORY NON FORMULARY MEDICATION, 1 Units by Other route daily. Walk in bathtub/shower, Disp: 1 Units, Rfl: 0 .  Calcium Carb-Ergocalciferol 250-125 MG-UNIT TABS, Take by mouth., Disp: , Rfl:  .  Calcium Carbonate-Vit D-Min (CALCIUM 1200 PO), Take by mouth., Disp: , Rfl:  .  famotidine (PEPCID) 20 MG tablet, Take by mouth., Disp: , Rfl:  .  folic acid (FOLVITE) 1 MG tablet, Take 1 mg by mouth daily. , Disp: , Rfl:  .  fondaparinux (ARIXTRA) 7.5 MG/0.6ML SOLN injection, Inject into the skin., Disp: , Rfl:  .  hydrocortisone (ANUSOL-HC) 2.5 % rectal cream, Place 1 application rectally 2 (two) times daily., Disp: 30 g, Rfl: 1 .  Hydrocortisone (GERHARDT'S BUTT CREAM) CREA, Apply 1 application topically daily., Disp: 1 each, Rfl: 0 .  levocetirizine (XYZAL) 5 MG tablet, Take 1 tablet (5 mg total) by mouth as needed., Disp: 90 tablet, Rfl: 1 .  Melatonin 3 MG TABS, Take 3 mg by mouth., Disp: , Rfl:  .  methotrexate 2.5 MG tablet, Take 10 mg by mouth once a week. Takes on Fridays, Disp: , Rfl:  .  Multiple Vitamin (MULTIVITAMIN) capsule, Take 1 capsule by mouth daily., Disp: , Rfl:  .  nitrofurantoin, macrocrystal-monohydrate, (MACROBID) 100 MG capsule, Take 1 capsule (100 mg total) by mouth 2 (two) times daily., Disp: 14 capsule, Rfl: 0 .  omeprazole (PRILOSEC) 20 MG capsule, Take 1 capsule (20 mg total) by mouth daily., Disp: 90 capsule, Rfl: 3 .  potassium chloride SA (K-DUR,KLOR-CON) 20 MEQ tablet, Take 1 tablet (20 mEq total) by mouth 2 (two) times daily., Disp: 60 tablet, Rfl: 1 .  predniSONE (DELTASONE) 5 MG tablet, Take 10 mg by mouth. , Disp: , Rfl:  .  Probiotic Product (TRUNATURE PROBIOTIC FOR KIDS) CHEW, Chew 1 each by  mouth daily., Disp: , Rfl:  .  warfarin (COUMADIN) 4 MG tablet, Take 1 tablet (4 mg total) by mouth daily., Disp: 30 tablet, Rfl: 0 .  OLANZapine (ZYPREXA) 2.5 MG tablet, Take 1 tablet (2.5 mg total) by mouth at bedtime., Disp: 30 tablet, Rfl: 0  Family History  Problem Relation Age of Onset  . Osteoporosis Mother   . Chronic Renal Failure Father   . Arthritis/Rheumatoid Sister   . Stroke Sister   . Fibromyalgia Sister   . Breast cancer Sister 37  . Diabetes Brother      Social History   Tobacco Use  . Smoking status: Former Smoker    Years: 3.00    Types: Cigarettes    Last attempt to quit: 1969    Years since quitting: 50.3  . Smokeless tobacco: Never Used  Substance Use Topics  . Alcohol use: No    Comment: 4-5 times a year  .  Drug use: No    Allergies as of 07/15/2017 - Review Complete 07/15/2017  Allergen Reaction Noted  . Entex lq [phenylephrine-guaifenesin] Swelling 08/18/2015  . Phenylephrine-guaifenesin Swelling 05/25/2017  . Sulfa antibiotics Itching and Hives 08/18/2015  . Amoxicillin-pot clavulanate  05/25/2017  . Codeine Hives and Other (See Comments) 08/18/2015  . Tape Other (See Comments) 01/08/2016    Review of Systems:    All systems reviewed and negative except where noted in HPI.   Physical Exam:  BP (!) 131/94   Pulse (!) 120   Wt 168 lb (76.2 kg)   BMI 32.81 kg/m  No LMP recorded. Patient has had a hysterectomy.  General:   Alert, sitting in wheelchair, pleasant and cooperative in NAD Head:  Normocephalic and atraumatic. Eyes:  Sclera clear, no icterus.   Conjunctiva pink. Ears:  Normal auditory acuity. Nose:  No deformity, discharge, or lesions. Mouth:  No deformity or lesions,oropharynx pink & moist. Neck:  Supple; no masses or thyromegaly. Lungs:  Respirations even and unlabored.  Clear throughout to auscultation.   No wheezes, crackles, or rhonchi. No acute distress. Heart:  Regular rate and rhythm; no murmurs, clicks, rubs, or  gallops. Abdomen:  Normal bowel sounds. Soft, obese,non-tender and non-distended without masses, hepatosplenomegaly or hernias noted.  No guarding or rebound tenderness.   Rectal: perianal exam revealed cluster of noninflamed external hemorrhoids, digital rectal exam revealed mild tenderness in the anal canal and internal hemorrhoids Msk:  Significant lower extremity weakness Pulses:  Normal pulses noted. Extremities:  No clubbing or edema.  No cyanosis. Neurologic:  Alert and oriented x3;  Skin:  Intact without significant lesions or rashes. No jaundice. Psych:  Alert and cooperative. Appears depressed and tearful  Imaging Studies: reviewed  Assessment and Plan:   Lindsay Stephenson is a 56 y.o. Caucasian female with history of LABD on weekly methotrexate and folate acid, mild ulcerative pancolitis, severe C. Difficile colitis in 12/2444, complicated by right lower extremity DVT on Coumadin and remarkable deconditioning is seen in consultation for management of severe symptomatic hemorrhoids.  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

## 2017-07-18 ENCOUNTER — Other Ambulatory Visit: Payer: Self-pay

## 2017-07-18 ENCOUNTER — Telehealth: Payer: Self-pay

## 2017-07-18 DIAGNOSIS — N390 Urinary tract infection, site not specified: Secondary | ICD-10-CM | POA: Diagnosis not present

## 2017-07-18 MED ORDER — HYDROCORTISONE ACETATE 25 MG RE SUPP: 25.0000 mg | Freq: Every day | RECTAL | 0 refills | Status: DC

## 2017-07-18 NOTE — Telephone Encounter (Signed)
HH Aide- Seth Bake called stating that pt is c/o painful urine and odor again. She was treated on 07/06/17 with furantoin which showed sensitivity on culture. Repeat urine culture and then may send to urology. Called Seth Bake and left message

## 2017-07-19 ENCOUNTER — Other Ambulatory Visit: Payer: Self-pay

## 2017-07-19 DIAGNOSIS — I10 Essential (primary) hypertension: Secondary | ICD-10-CM | POA: Diagnosis not present

## 2017-07-19 DIAGNOSIS — E785 Hyperlipidemia, unspecified: Secondary | ICD-10-CM | POA: Diagnosis not present

## 2017-07-19 DIAGNOSIS — M5136 Other intervertebral disc degeneration, lumbar region: Secondary | ICD-10-CM | POA: Diagnosis not present

## 2017-07-19 DIAGNOSIS — J45909 Unspecified asthma, uncomplicated: Secondary | ICD-10-CM | POA: Diagnosis not present

## 2017-07-19 DIAGNOSIS — G20 Parkinson's disease: Secondary | ICD-10-CM | POA: Diagnosis not present

## 2017-07-19 DIAGNOSIS — M172 Bilateral post-traumatic osteoarthritis of knee: Secondary | ICD-10-CM | POA: Diagnosis not present

## 2017-07-19 DIAGNOSIS — I471 Supraventricular tachycardia: Secondary | ICD-10-CM | POA: Diagnosis not present

## 2017-07-19 DIAGNOSIS — A0472 Enterocolitis due to Clostridium difficile, not specified as recurrent: Secondary | ICD-10-CM | POA: Diagnosis not present

## 2017-07-19 DIAGNOSIS — E43 Unspecified severe protein-calorie malnutrition: Secondary | ICD-10-CM | POA: Diagnosis not present

## 2017-07-19 DIAGNOSIS — G4733 Obstructive sleep apnea (adult) (pediatric): Secondary | ICD-10-CM | POA: Diagnosis not present

## 2017-07-21 ENCOUNTER — Other Ambulatory Visit: Payer: Self-pay

## 2017-07-21 ENCOUNTER — Telehealth: Payer: Self-pay | Admitting: Gastroenterology

## 2017-07-21 DIAGNOSIS — A0472 Enterocolitis due to Clostridium difficile, not specified as recurrent: Secondary | ICD-10-CM | POA: Diagnosis not present

## 2017-07-21 DIAGNOSIS — E785 Hyperlipidemia, unspecified: Secondary | ICD-10-CM | POA: Diagnosis not present

## 2017-07-21 DIAGNOSIS — J45909 Unspecified asthma, uncomplicated: Secondary | ICD-10-CM | POA: Diagnosis not present

## 2017-07-21 DIAGNOSIS — G4733 Obstructive sleep apnea (adult) (pediatric): Secondary | ICD-10-CM | POA: Diagnosis not present

## 2017-07-21 DIAGNOSIS — G20 Parkinson's disease: Secondary | ICD-10-CM | POA: Diagnosis not present

## 2017-07-21 DIAGNOSIS — E43 Unspecified severe protein-calorie malnutrition: Secondary | ICD-10-CM | POA: Diagnosis not present

## 2017-07-21 DIAGNOSIS — I10 Essential (primary) hypertension: Secondary | ICD-10-CM | POA: Diagnosis not present

## 2017-07-21 DIAGNOSIS — I471 Supraventricular tachycardia: Secondary | ICD-10-CM | POA: Diagnosis not present

## 2017-07-21 DIAGNOSIS — M5136 Other intervertebral disc degeneration, lumbar region: Secondary | ICD-10-CM | POA: Diagnosis not present

## 2017-07-21 DIAGNOSIS — M172 Bilateral post-traumatic osteoarthritis of knee: Secondary | ICD-10-CM | POA: Diagnosis not present

## 2017-07-21 MED ORDER — NITROFURANTOIN MONOHYD MACRO 100 MG PO CAPS: 100.0000 mg | ORAL_CAPSULE | Freq: Two times a day (BID) | ORAL | 0 refills | Status: DC

## 2017-07-21 NOTE — Telephone Encounter (Signed)
Patient has been informed - recommended to continue topical nitroglycerin + lidocaine 2 times daily - recommend to continue Anusol cream 2 times daily - Witch hazel or Tucks pads - Discussed about outpatient hemorrhoid ligation in 4-6 weeks after clearance from Dr Lucky Cowboy for interruption of anticoagulation prior to ligation.

## 2017-07-21 NOTE — Telephone Encounter (Signed)
Pt states Dr. Marius Ditch told her to get a cream that would help her with burning and itching she can not remember the name of it and has misplaced her paper. Please call pt

## 2017-07-25 DIAGNOSIS — J45909 Unspecified asthma, uncomplicated: Secondary | ICD-10-CM | POA: Diagnosis not present

## 2017-07-25 DIAGNOSIS — M172 Bilateral post-traumatic osteoarthritis of knee: Secondary | ICD-10-CM | POA: Diagnosis not present

## 2017-07-25 DIAGNOSIS — I471 Supraventricular tachycardia: Secondary | ICD-10-CM | POA: Diagnosis not present

## 2017-07-25 DIAGNOSIS — E43 Unspecified severe protein-calorie malnutrition: Secondary | ICD-10-CM | POA: Diagnosis not present

## 2017-07-25 DIAGNOSIS — E785 Hyperlipidemia, unspecified: Secondary | ICD-10-CM | POA: Diagnosis not present

## 2017-07-25 DIAGNOSIS — I10 Essential (primary) hypertension: Secondary | ICD-10-CM | POA: Diagnosis not present

## 2017-07-25 DIAGNOSIS — G4733 Obstructive sleep apnea (adult) (pediatric): Secondary | ICD-10-CM | POA: Diagnosis not present

## 2017-07-25 DIAGNOSIS — M5136 Other intervertebral disc degeneration, lumbar region: Secondary | ICD-10-CM | POA: Diagnosis not present

## 2017-07-25 DIAGNOSIS — G20 Parkinson's disease: Secondary | ICD-10-CM | POA: Diagnosis not present

## 2017-07-25 DIAGNOSIS — A0472 Enterocolitis due to Clostridium difficile, not specified as recurrent: Secondary | ICD-10-CM | POA: Diagnosis not present

## 2017-07-27 ENCOUNTER — Other Ambulatory Visit: Payer: Self-pay

## 2017-07-27 ENCOUNTER — Ambulatory Visit (INDEPENDENT_AMBULATORY_CARE_PROVIDER_SITE_OTHER): Payer: Medicare HMO

## 2017-07-27 ENCOUNTER — Telehealth: Payer: Self-pay

## 2017-07-27 ENCOUNTER — Telehealth (INDEPENDENT_AMBULATORY_CARE_PROVIDER_SITE_OTHER): Payer: Self-pay | Admitting: Vascular Surgery

## 2017-07-27 VITALS — BP 120/72 | HR 96 | Temp 98.2°F | Resp 16 | Ht 60.0 in | Wt 168.8 lb

## 2017-07-27 DIAGNOSIS — Z1231 Encounter for screening mammogram for malignant neoplasm of breast: Secondary | ICD-10-CM | POA: Diagnosis not present

## 2017-07-27 DIAGNOSIS — Z1239 Encounter for other screening for malignant neoplasm of breast: Secondary | ICD-10-CM

## 2017-07-27 DIAGNOSIS — N390 Urinary tract infection, site not specified: Secondary | ICD-10-CM | POA: Diagnosis not present

## 2017-07-27 DIAGNOSIS — Z0000 Encounter for general adult medical examination without abnormal findings: Secondary | ICD-10-CM | POA: Diagnosis not present

## 2017-07-27 DIAGNOSIS — I824Y1 Acute embolism and thrombosis of unspecified deep veins of right proximal lower extremity: Secondary | ICD-10-CM

## 2017-07-27 DIAGNOSIS — N3000 Acute cystitis without hematuria: Secondary | ICD-10-CM

## 2017-07-27 MED ORDER — CIPROFLOXACIN HCL 250 MG PO TABS: 250.0000 mg | ORAL_TABLET | Freq: Two times a day (BID) | ORAL | 0 refills | Status: DC

## 2017-07-27 NOTE — Patient Instructions (Signed)
Lindsay Stephenson , Thank you for taking time to come for your Medicare Wellness Visit. I appreciate your ongoing commitment to your health goals. Please review the following plan we discussed and let me know if I can assist you in the future.   Screening recommendations/referrals: Colorectal Screening: Completed 01/16/16. Repeat every 10 years Mammogram: Completed 07/13/16. Repeat every year. Ordered today. Bone Density: Completed 07/13/16. Osteoporotic screenings no longer required Lung Cancer Screening: You do not qualify for this screening Hepatitis C Screening: Completed 03/01/16  Vision and Dental Exams: Recommended annual ophthalmology exams for early detection of glaucoma and other disorders of the eye Recommended annual dental exams for proper oral hygiene  Vaccinations: Influenza vaccine: Up to date Pneumococcal vaccine: Completed series Tdap vaccine: Up to date Shingles vaccine: Please call your insurance company to determine your out of pocket expense for the Shingrix vaccine. You may also receive this vaccine at your local pharmacy or Health Dept.   Advanced directives: Please bring a copy of your POA (Power of Attorney) and/or Living Will to your next appointment.  Conditions/risks identified: Recommend to drink at least 6-8 8oz glasses of water per day.  Next appointment: Please schedule your Annual Wellness Visit with your Nurse Health Advisor in one year.  Preventive Care 71 Years and Older, Female Preventive care refers to lifestyle choices and visits with your health care provider that can promote health and wellness. What does preventive care include?  A yearly physical exam. This is also called an annual well check.  Dental exams once or twice a year.  Routine eye exams. Ask your health care provider how often you should have your eyes checked.  Personal lifestyle choices, including:  Daily care of your teeth and gums.  Regular physical activity.  Eating a healthy  diet.  Avoiding tobacco and drug use.  Limiting alcohol use.  Practicing safe sex.  Taking low-dose aspirin every day.  Taking vitamin and mineral supplements as recommended by your health care provider. What happens during an annual well check? The services and screenings done by your health care provider during your annual well check will depend on your age, overall health, lifestyle risk factors, and family history of disease. Counseling  Your health care provider may ask you questions about your:  Alcohol use.  Tobacco use.  Drug use.  Emotional well-being.  Home and relationship well-being.  Sexual activity.  Eating habits.  History of falls.  Memory and ability to understand (cognition).  Work and work Statistician.  Reproductive health. Screening  You may have the following tests or measurements:  Height, weight, and BMI.  Blood pressure.  Lipid and cholesterol levels. These may be checked every 5 years, or more frequently if you are over 43 years old.  Skin check.  Lung cancer screening. You may have this screening every year starting at age 71 if you have a 30-pack-year history of smoking and currently smoke or have quit within the past 15 years.  Fecal occult blood test (FOBT) of the stool. You may have this test every year starting at age 71.  Flexible sigmoidoscopy or colonoscopy. You may have a sigmoidoscopy every 5 years or a colonoscopy every 10 years starting at age 71.  Hepatitis C blood test.  Hepatitis B blood test.  Sexually transmitted disease (STD) testing.  Diabetes screening. This is done by checking your blood sugar (glucose) after you have not eaten for a while (fasting). You may have this done every 1-3 years.  Bone density  scan. This is done to screen for osteoporosis. You may have this done starting at age 71.  Mammogram. This may be done every 1-2 years. Talk to your health care provider about how often you should have  regular mammograms. Talk with your health care provider about your test results, treatment options, and if necessary, the need for more tests. Vaccines  Your health care provider may recommend certain vaccines, such as:  Influenza vaccine. This is recommended every year.  Tetanus, diphtheria, and acellular pertussis (Tdap, Td) vaccine. You may need a Td booster every 10 years.  Zoster vaccine. You may need this after age 71.  Pneumococcal 13-valent conjugate (PCV13) vaccine. One dose is recommended after age 71.  Pneumococcal polysaccharide (PPSV23) vaccine. One dose is recommended after age 71. Talk to your health care provider about which screenings and vaccines you need and how often you need them. This information is not intended to replace advice given to you by your health care provider. Make sure you discuss any questions you have with your health care provider. Document Released: 04/18/2015 Document Revised: 12/10/2015 Document Reviewed: 01/21/2015 Elsevier Interactive Patient Education  2017 [place] Prevention in the Home Falls can cause injuries. They can happen to people of all ages. There are many things you can do to make your home safe and to help prevent falls. What can I do on the outside of my home?  Regularly fix the edges of walkways and driveways and fix any cracks.  Remove anything that might make you trip as you walk through a door, such as a raised step or threshold.  Trim any bushes or trees on the path to your home.  Use bright outdoor lighting.  Clear any walking paths of anything that might make someone trip, such as rocks or tools.  Regularly check to see if handrails are loose or broken. Make sure that both sides of any steps have handrails.  Any raised decks and porches should have guardrails on the edges.  Have any leaves, snow, or ice cleared regularly.  Use sand or salt on walking paths during winter.  Clean up any spills in your  garage right away. This includes oil or grease spills. What can I do in the bathroom?  Use night lights.  Install grab bars by the toilet and in the tub and shower. Do not use towel bars as grab bars.  Use non-skid mats or decals in the tub or shower.  If you need to sit down in the shower, use a plastic, non-slip stool.  Keep the floor dry. Clean up any water that spills on the floor as soon as it happens.  Remove soap buildup in the tub or shower regularly.  Attach bath mats securely with double-sided non-slip rug tape.  Do not have throw rugs and other things on the floor that can make you trip. What can I do in the bedroom?  Use night lights.  Make sure that you have a light by your bed that is easy to reach.  Do not use any sheets or blankets that are too big for your bed. They should not hang down onto the floor.  Have a firm chair that has side arms. You can use this for support while you get dressed.  Do not have throw rugs and other things on the floor that can make you trip. What can I do in the kitchen?  Clean up any spills right away.  Avoid walking on wet  floors.  Keep items that you use a lot in easy-to-reach places.  If you need to reach something above you, use a strong step stool that has a grab bar.  Keep electrical cords out of the way.  Do not use floor polish or wax that makes floors slippery. If you must use wax, use non-skid floor wax.  Do not have throw rugs and other things on the floor that can make you trip. What can I do with my stairs?  Do not leave any items on the stairs.  Make sure that there are handrails on both sides of the stairs and use them. Fix handrails that are broken or loose. Make sure that handrails are as long as the stairways.  Check any carpeting to make sure that it is firmly attached to the stairs. Fix any carpet that is loose or worn.  Avoid having throw rugs at the top or bottom of the stairs. If you do have throw  rugs, attach them to the floor with carpet tape.  Make sure that you have a light switch at the top of the stairs and the bottom of the stairs. If you do not have them, ask someone to add them for you. What else can I do to help prevent falls?  Wear shoes that:  Do not have high heels.  Have rubber bottoms.  Are comfortable and fit you well.  Are closed at the toe. Do not wear sandals.  If you use a stepladder:  Make sure that it is fully opened. Do not climb a closed stepladder.  Make sure that both sides of the stepladder are locked into place.  Ask someone to hold it for you, if possible.  Clearly mark and make sure that you can see:  Any grab bars or handrails.  First and last steps.  Where the edge of each step is.  Use tools that help you move around (mobility aids) if they are needed. These include:  Canes.  Walkers.  Scooters.  Crutches.  Turn on the lights when you go into a dark area. Replace any light bulbs as soon as they burn out.  Set up your furniture so you have a clear path. Avoid moving your furniture around.  If any of your floors are uneven, fix them.  If there are any pets around you, be aware of where they are.  Review your medicines with your doctor. Some medicines can make you feel dizzy. This can increase your chance of falling. Ask your doctor what other things that you can do to help prevent falls. This information is not intended to replace advice given to you by your health care provider. Make sure you discuss any questions you have with your health care provider. Document Released: 01/16/2009 Document Revised: 08/28/2015 Document Reviewed: 04/26/2014 Elsevier Interactive Patient Education  2017 [employer].  Not yet established with an ophthalmologist? A list of physicians has been provided below. Please contact one of the providers below to establish care.   [facility] Address: [address] [place], [place] [address]   Address: [address] [place], [place] [address]  Phone: [phone]      Phone: [phone]  Website: [email or web address]    Website: [email or web address]     [facility]  Address: [address] [place], [place], [place] [address]   Address: [address]  [address], [place], [place] [address] Phone: [phone]      Phone: [phone]    [facility] Address: [place], [place], [place] [address]  Phone: [phone]

## 2017-07-27 NOTE — Telephone Encounter (Signed)
Pt's culture came back E-Coli- sent in Cipro 253m BID #6 to [facility]

## 2017-07-27 NOTE — Progress Notes (Signed)
Subjective:   Lindsay Stephenson is a 72 y.o. female who presents for Medicare Annual (Subsequent) preventive examination.  Review of Systems:  N/A Cardiac Risk Factors include: advanced age (>44mn, >>24women);dyslipidemia;hypertension;obesity (BMI >30kg/m2);sedentary lifestyle     Objective:     Vitals: BP 120/72 (BP Location: Left Arm, Patient Position: Sitting, Cuff Size: Large)   Pulse 96   Temp 98.2 F (36.8 C) (Oral)   Resp 16   Ht 5' (1.524 m)   Wt 168 lb 12.8 oz (76.6 kg)   SpO2 95%   BMI 32.97 kg/m   Body mass index is 32.97 kg/m.  Advanced Directives 07/27/2017 05/15/2017 05/15/2017 05/09/2017 04/24/2017  Does Patient Have a Medical Advance Directive? Yes Yes Yes No No  Type of [place] Living will;Healthcare Power of Attorney Living will;Healthcare Power of Attorney - -  Does patient want to make changes to medical advance directive? - No - Patient declined - - -  Copy of [place] Chart? No - copy requested No - copy requested - - -  Would patient like information on creating a medical advance directive? - - No - Patient declined No - Patient declined -    Tobacco Social History   Tobacco Use  Smoking Status Former Smoker  . Packs/day: 3.00  . Years: 3.00  . Pack years: 9.00  . Types: Cigarettes  . Last attempt to quit: 1969  . Years since quitting: 50.3  Smokeless Tobacco Never Used  Tobacco Comment   smoking cessation materials not required     Counseling given: No Comment: smoking cessation materials not required   Clinical Intake:  Pre-visit preparation completed: Yes  Pain : No/denies pain   BMI - recorded: 32.97 Nutritional Status: BMI > 30  Obese Nutritional Risks: None Diabetes: No  How often do you need to have someone help you when you read instructions, pamphlets, or other written materials from your doctor or pharmacy?: 1 - Never  Interpreter Needed?:  No  Information entered by :: AEversole, LPN  Past Medical History:  Diagnosis Date  . Allergy   . Anemia    TAKING IRON  . Arthritis    KNEES AND HANDS  . Asthma    COUGHING, NO ATTACKS IN A YEAR OR 2  . Dyspnea    ON EXERTION  . GERD (gastroesophageal reflux disease)   . HOH (hard of hearing)    30-35 % HEARING LOSS  . Hyperlipidemia   . Hypertension    HX OF  . LABD (linear immunoglobulin A bullous dermatosis)   . Mitral valve prolapse   . Mitral valve prolapse   . 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 Parkinson's disease ([place]   . Rectal bleeding   . Sleep apnea    CPAP  . Vertigo    Past Surgical History:  Procedure Laterality Date  . ABDOMINAL HYSTERECTOMY    . APPENDECTOMY    . COLONOSCOPY WITH PROPOFOL N/A 01/16/2016   Procedure: COLONOSCOPY WITH PROPOFOL;  Surgeon: DLucilla Lame MD;  [facility]  Service: Endoscopy;  Laterality: N/A;  CPAP  . KNEE SURGERY     2013 and 2014  . POLYPECTOMY  01/16/2016   Procedure: POLYPECTOMY;  Surgeon: DLucilla Lame MD;  [facility]  Service: Endoscopy;;  . REPLACEMENT TOTAL KNEE BILATERAL Bilateral    2013, 2014  . TONSILLECTOMY     ADENOIDECTOMY  Family History  Problem Relation Age of Onset  . Osteoporosis Mother   . Urinary tract infection Mother   . Dementia Mother   . Congestive Heart Failure Mother   . Chronic Renal Failure Father   . Diabetes Father   . Hypertension Father   . Stroke Father   . Arthritis/Rheumatoid Sister   . Stroke Sister   . Fibromyalgia Sister   . Breast cancer Sister 44  . Urinary tract infection Sister   . Heart attack Sister   . Diabetes Brother   . Hypertension Son    Social History   Socioeconomic History  . Marital status: Married    Spouse name: Gwyndolyn Saxon  . Number of children: 1  . Years of education: Not on file  . Highest education level: Doctorate  Occupational History  . Occupation: retired     Comment: high school french  Social Needs  . Financial resource strain: Not hard at all  . Food insecurity:    Worry: Never true    Inability: Never true  . Transportation needs:    Medical: No    Non-medical: No  Tobacco Use  . Smoking status: Former Smoker    Packs/day: 3.00    Years: 3.00    Pack years: 9.00    Types: Cigarettes    Last attempt to quit: 1969    Years since quitting: 50.3  . Smokeless tobacco: Never Used  . Tobacco comment: smoking cessation materials not required  Substance and Sexual Activity  . Alcohol use: No  . Drug use: No  . Sexual activity: Not Currently  Lifestyle  . Physical activity:    Days per week: 7 days    Minutes per session: 30 min  . Stress: Not at all  Relationships  . Social connections:    Talks on phone: Patient refused    Gets together: Patient refused    Attends religious service: Patient refused    Active member of club or organization: Patient refused    Attends meetings of clubs or organizations: Patient refused    Relationship status: Married  Other Topics Concern  . Not on file  Social History Narrative  . Not on file    Outpatient Encounter Medications as of 07/27/2017  Medication Sig  . acetaminophen (TYLENOL) 325 MG tablet Take 650 mg by mouth.  Marland Kitchen albuterol (PROVENTIL HFA;VENTOLIN HFA) 108 (90 Base) MCG/ACT inhaler Inhale 1-2 puffs into the lungs every 6 (six) hours as needed for wheezing or shortness of breath.  . AMBULATORY NON FORMULARY MEDICATION 1 Units by Other route daily. Walk in bathtub/shower  . Calcium Carb-Ergocalciferol 250-125 MG-UNIT TABS Take by mouth.  . Calcium Carbonate-Vit D-Min (CALCIUM 1200 PO) Take by mouth.  . ciprofloxacin (CIPRO) 250 MG tablet Take 1 tablet (250 mg total) by mouth 2 (two) times daily.  . folic acid (FOLVITE) 1 MG tablet Take 1 mg by mouth daily.   . hydrocortisone (ANUSOL-HC) 2.5 % rectal cream Place 1 application rectally 2 (two) times daily.  . hydrocortisone  (ANUSOL-HC) 25 MG suppository Place 1 suppository (25 mg total) rectally at bedtime.  Marland Kitchen Hydrocortisone (GERHARDT'S BUTT CREAM) CREA Apply 1 application topically daily.  Marland Kitchen levocetirizine (XYZAL) 5 MG tablet Take 1 tablet (5 mg total) by mouth as needed.  . methotrexate 2.5 MG tablet Take 10 mg by mouth once a week. Takes on Fridays  . Multiple Vitamin (MULTIVITAMIN) capsule Take 1 capsule by mouth daily.  . nitrofurantoin, macrocrystal-monohydrate, (MACROBID) 100 MG capsule  Take 1 capsule (100 mg total) by mouth 2 (two) times daily.  Marland Kitchen omeprazole (PRILOSEC) 20 MG capsule Take 1 capsule (20 mg total) by mouth daily.  . potassium chloride SA (K-DUR,KLOR-CON) 20 MEQ tablet Take 1 tablet (20 mEq total) by mouth 2 (two) times daily.  . predniSONE (DELTASONE) 5 MG tablet Take 10 mg by mouth.   . Probiotic Product (TRUNATURE PROBIOTIC FOR KIDS) CHEW Chew 1 each by mouth daily.  Marland Kitchen warfarin (COUMADIN) 4 MG tablet Take 1 tablet (4 mg total) by mouth daily.  . [DISCONTINUED] famotidine (PEPCID) 20 MG tablet Take by mouth.  . [DISCONTINUED] fondaparinux (ARIXTRA) 7.5 MG/0.6ML SOLN injection Inject into the skin.  . [DISCONTINUED] Melatonin 3 MG TABS Take 3 mg by mouth.  . [DISCONTINUED] OLANZapine (ZYPREXA) 2.5 MG tablet Take 1 tablet (2.5 mg total) by mouth at bedtime.   No facility-administered encounter medications on file as of 07/27/2017.     Activities of Daily Living In your present state of health, do you have any difficulty performing the following activities: 07/27/2017 05/15/2017  Hearing? N Y  Comment denies hearing aids Righr ear  Vision? N N  Comment wears eyeglasses; B cataracts -  Difficulty concentrating or making decisions? Y N  Comment short term memory loss -  Walking or climbing stairs? Y Y  Comment peripheral neuropathy -  Dressing or bathing? Tempie Donning  Comment husband assists -  Doing errands, shopping? Tempie Donning  Comment husband transports to appts -  Conservation officer, nature and eating ? N -   Comment denies dentures -  Using the Toilet? Y -  Comment husband assists -  In the past six months, have you accidently leaked urine? Y -  Comment incontinent -  Do you have problems with loss of bowel control? N -  Managing your Medications? N -  Managing your Finances? Y -  Comment husband manages finances -  Housekeeping or managing your Housekeeping? N -  Some recent data might be hidden    Patient Care Team: Juline Patch, MD as PCP - General (Family Medicine) Dew, Erskine Squibb, MD as Consulting Physician (Vascular Surgery) Lucilla Lame, MD as Consulting Physician (Gastroenterology) Lin Landsman, MD as Consulting Physician (Gastroenterology) Alda Berthold, DO as Consulting Physician (Neurology) Sharol Roussel Elwyn Lade, MD as Consulting Physician (Dermatology)    Assessment:   This is a routine wellness examination for Ovella.  Exercise Activities and Dietary recommendations Current Exercise Habits: Home exercise routine, Type of exercise: strength training/weights, Time (Minutes): 30, Frequency (Times/Week): 7, Weekly Exercise (Minutes/Week): 210, Intensity: Mild, Exercise limited by: None identified  Goals    . DIET - INCREASE WATER INTAKE     Recommend to drink at least 6-8 8oz glasses of water per day.       Fall Risk Fall Risk  07/27/2017 03/07/2017 09/10/2016 05/25/2016 05/03/2016  Falls in the past year? Yes Yes Yes Yes Yes  Number falls in past yr: 2 or more 2 or more 2 or more 2 or more 2 or more  Injury with Fall? 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 Yes Adequate lighting to reduce risk of falls?  Yes In addition, does the patient have any of the following: Stairs in or around the home WITH handrails? No, has ramps Grab bars in the bathroom? Yes  Shower chair or a place to sit while bathing? Yes Use of an elevated toilet seat or a handicapped toilet? Yes Use of a cane, walker or w/c? Yes, walker and w/c  Timed Get Up and Go Performed: No. Unable to perform this assesment  Community Resource Referral not required at this time.  Depression Screen PHQ 2/9 Scores 07/27/2017 06/15/2017 05/04/2017 05/03/2016  PHQ - 2 Score 0 0 4 0  PHQ- 9 Score 0 0 16 -     Cognitive Function     6CIT Screen 07/27/2017 05/03/2016  What Year? 0 points 0 points  What month? 0 points 0 points  What time? 0 points 0 points  Count back from 20 0 points 0 points  Months in reverse 0 points 0 points  Repeat phrase 0 points 0 points  Total Score 0 0    Immunization History  Administered Date(s) Administered  . Influenza Split 01/03/2013, 12/12/2013  . Influenza, High Dose Seasonal PF 01/10/2015  . Influenza,inj,Quad PF,6+ Mos 01/02/2016, 11/26/2016  . Influenza-Unspecified 01/06/2017  . Pneumococcal Conjugate-13 03/06/2015  . Pneumococcal Polysaccharide-23 12/12/2013  . Tdap 08/18/2015    Qualifies for Shingles Vaccine? Yes. Due for Zostavax or Shingrix vaccine. Education has been provided regarding the importance of this vaccine. Pt has been advised to call her insurance company to determine her out of pocket expense.  Advised she may also receive this vaccine at her local pharmacy or Health Dept. Verbalized acceptance and understanding.  Screening Tests Health Maintenance  Topic Date Due  . MAMMOGRAM  07/13/2017  . INFLUENZA VACCINE  11/03/2017  . TETANUS/TDAP  08/17/2025  . COLONOSCOPY  01/15/2026  . DEXA SCAN  Completed  . Hepatitis C Screening  Completed  . PNA vac Low Risk Adult  Completed    Cancer Screenings: Lung: Low Dose CT Chest recommended if Age 51-80 years, 30 pack-year currently smoking OR have quit w/in 15years. Patient does not qualify. Breast:  Up to date on Mammogram? No. Completed 07/13/16. Repeat every year. Ordered today. Message sent to referral coordinator for scheduling purposes. Pt aware that she will receive a call from our office re: her appt. Up to date of Bone Density/Dexa? Yes. Completed 07/13/16. Osteoporotic screenings no longer required Colorectal: Completed 01/16/16. Repeat every 10 years  Additional Screenings: Hepatitis C Screening: Completed 03/01/16    Plan:  I have personally reviewed and addressed the Medicare Annual Wellness questionnaire and have noted the following in the patient's chart:  A. Medical and social history B. Use of alcohol, tobacco or illicit drugs  C. Current medications and supplements D. Functional ability and status E.  Nutritional status F.  Physical activity G. Advance directives H. List of other physicians I.  Hospitalizations, surgeries, and ER visits in previous 12 months J.  [place] such as hearing and vision if needed, cognitive and depression L. Referrals and appointments  In addition, I  have reviewed and discussed with patient certain preventive protocols, quality metrics, and best practice recommendations. A written personalized care plan for preventive services as well as general preventive health recommendations were provided to patient.  Signed,  Aleatha Borer, LPN Nurse Health Advisor  MD  Recommendations: Due for Zostavax or Shingrix vaccine. Education has been provided regarding the importance of this vaccine. Pt has been advised to call her insurance company to determine her out of pocket expense. Advised she may also receive this vaccine at her local pharmacy or Health Dept. Verbalized acceptance and understanding.  Due for mammogram. Completed 07/13/16. Repeat every year. Ordered today. Message sent to referral coordinator for scheduling purposes. Pt aware that she will receive a call from our office re: her appt.  Referral to Dr. Jacqlyn Larsen for recurrent UTI's. Message sent to referral coordinator for scheduling purposes. Pt is aware that she will receive a call from our office re: her appt.  PT/INR ordered today

## 2017-07-28 ENCOUNTER — Other Ambulatory Visit: Payer: Self-pay

## 2017-07-28 DIAGNOSIS — I10 Essential (primary) hypertension: Secondary | ICD-10-CM | POA: Diagnosis not present

## 2017-07-28 DIAGNOSIS — M172 Bilateral post-traumatic osteoarthritis of knee: Secondary | ICD-10-CM | POA: Diagnosis not present

## 2017-07-28 DIAGNOSIS — A0472 Enterocolitis due to Clostridium difficile, not specified as recurrent: Secondary | ICD-10-CM | POA: Diagnosis not present

## 2017-07-28 DIAGNOSIS — G20 Parkinson's disease: Secondary | ICD-10-CM | POA: Diagnosis not present

## 2017-07-28 DIAGNOSIS — E785 Hyperlipidemia, unspecified: Secondary | ICD-10-CM | POA: Diagnosis not present

## 2017-07-28 DIAGNOSIS — M5136 Other intervertebral disc degeneration, lumbar region: Secondary | ICD-10-CM | POA: Diagnosis not present

## 2017-07-28 DIAGNOSIS — J45909 Unspecified asthma, uncomplicated: Secondary | ICD-10-CM | POA: Diagnosis not present

## 2017-07-28 DIAGNOSIS — G4733 Obstructive sleep apnea (adult) (pediatric): Secondary | ICD-10-CM | POA: Diagnosis not present

## 2017-07-28 DIAGNOSIS — I471 Supraventricular tachycardia: Secondary | ICD-10-CM | POA: Diagnosis not present

## 2017-07-28 DIAGNOSIS — E43 Unspecified severe protein-calorie malnutrition: Secondary | ICD-10-CM | POA: Diagnosis not present

## 2017-07-28 LAB — PROTIME-INR
INR: 1.1 (ref 0.8–1.2)
Prothrombin Time: 11.8 s (ref 9.1–12.0)

## 2017-08-01 DIAGNOSIS — M5136 Other intervertebral disc degeneration, lumbar region: Secondary | ICD-10-CM | POA: Diagnosis not present

## 2017-08-01 DIAGNOSIS — M172 Bilateral post-traumatic osteoarthritis of knee: Secondary | ICD-10-CM | POA: Diagnosis not present

## 2017-08-01 DIAGNOSIS — J45909 Unspecified asthma, uncomplicated: Secondary | ICD-10-CM | POA: Diagnosis not present

## 2017-08-01 DIAGNOSIS — E785 Hyperlipidemia, unspecified: Secondary | ICD-10-CM | POA: Diagnosis not present

## 2017-08-01 DIAGNOSIS — A0472 Enterocolitis due to Clostridium difficile, not specified as recurrent: Secondary | ICD-10-CM | POA: Diagnosis not present

## 2017-08-01 DIAGNOSIS — G20 Parkinson's disease: Secondary | ICD-10-CM | POA: Diagnosis not present

## 2017-08-01 DIAGNOSIS — I471 Supraventricular tachycardia: Secondary | ICD-10-CM | POA: Diagnosis not present

## 2017-08-01 DIAGNOSIS — G4733 Obstructive sleep apnea (adult) (pediatric): Secondary | ICD-10-CM | POA: Diagnosis not present

## 2017-08-01 DIAGNOSIS — I10 Essential (primary) hypertension: Secondary | ICD-10-CM | POA: Diagnosis not present

## 2017-08-01 DIAGNOSIS — E43 Unspecified severe protein-calorie malnutrition: Secondary | ICD-10-CM | POA: Diagnosis not present

## 2017-08-03 ENCOUNTER — Ambulatory Visit: Payer: Medicare HMO

## 2017-08-03 DIAGNOSIS — Z79899 Other long term (current) drug therapy: Secondary | ICD-10-CM | POA: Diagnosis not present

## 2017-08-03 DIAGNOSIS — L138 Other specified bullous disorders: Secondary | ICD-10-CM | POA: Diagnosis not present

## 2017-08-03 DIAGNOSIS — G629 Polyneuropathy, unspecified: Secondary | ICD-10-CM | POA: Diagnosis not present

## 2017-08-03 IMAGING — MG MM DIGITAL SCREENING BILAT W/ CAD
4 series · 4 of 4 positions shown · non-contrast
Comparison: Previous exam(s).

CLINICAL DATA: Screening.

EXAM:
DIGITAL SCREENING BILATERAL MAMMOGRAM WITH CAD

[L MLO]
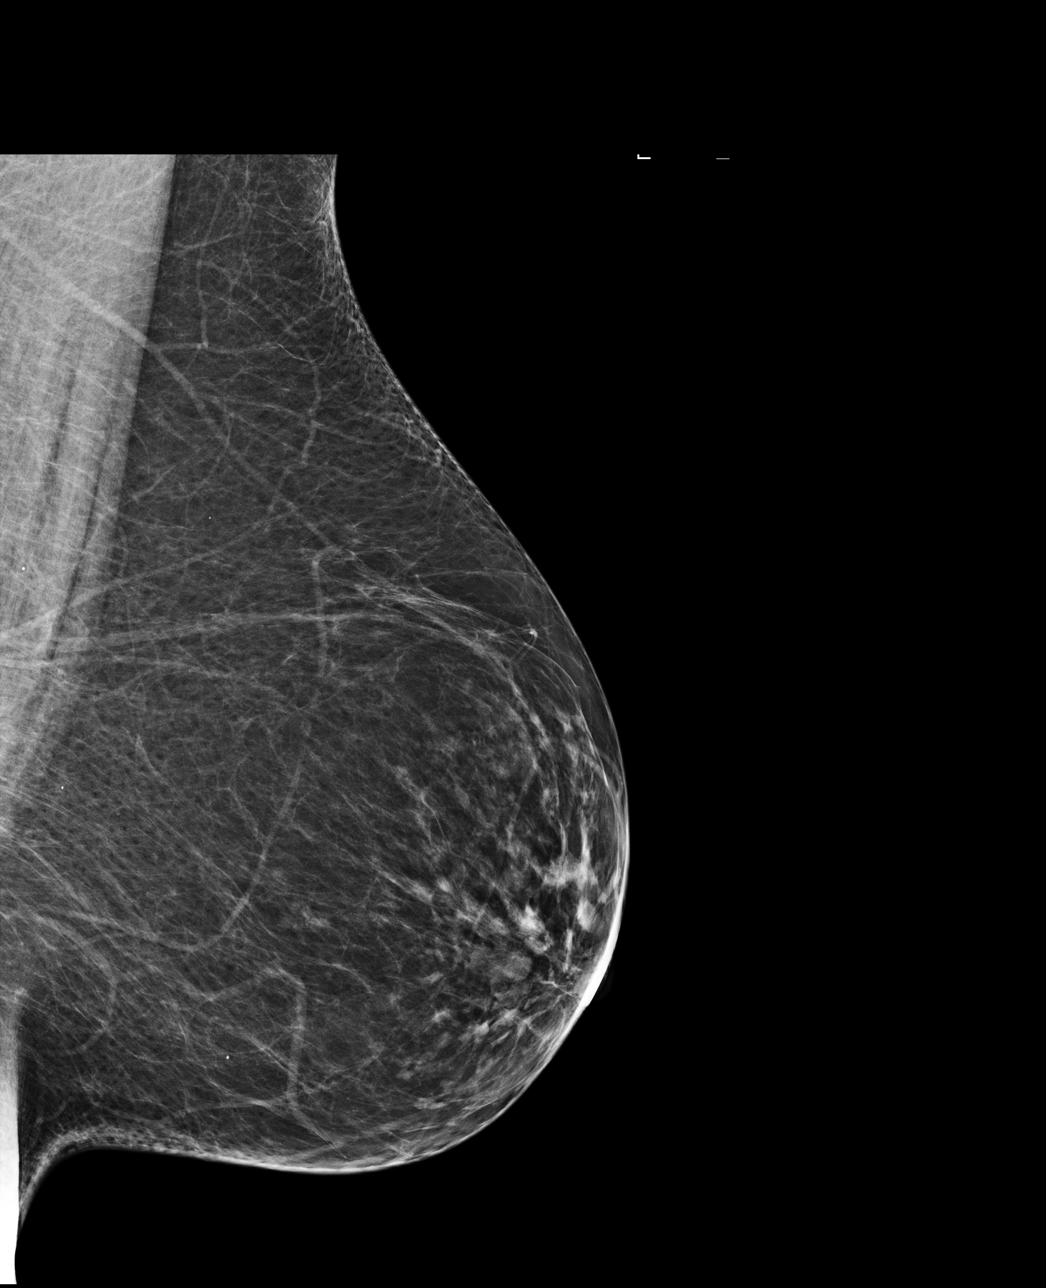

[R CC]
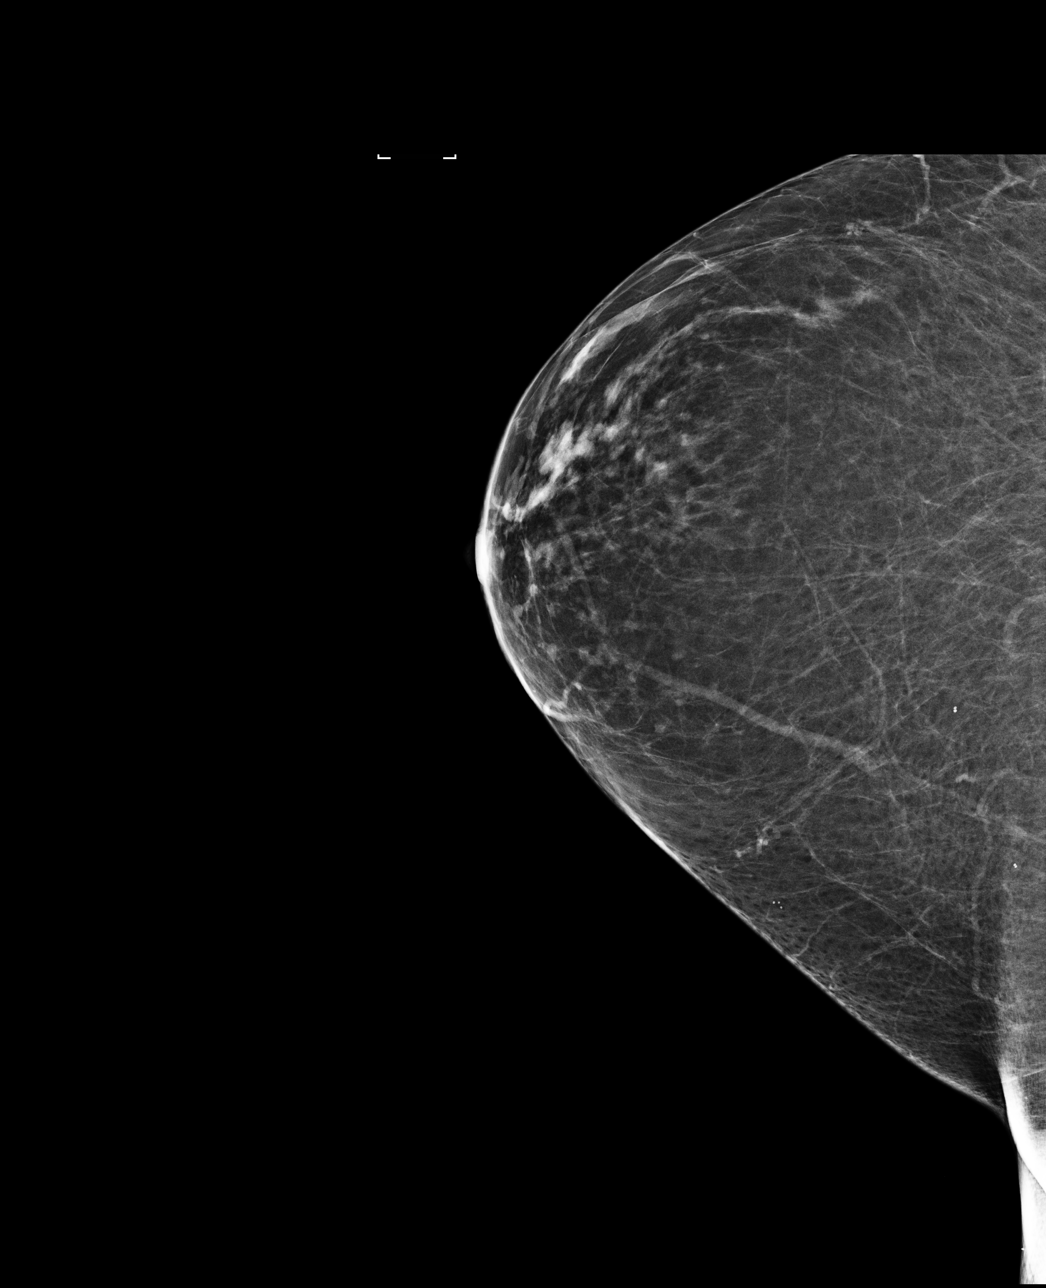

[L CC]
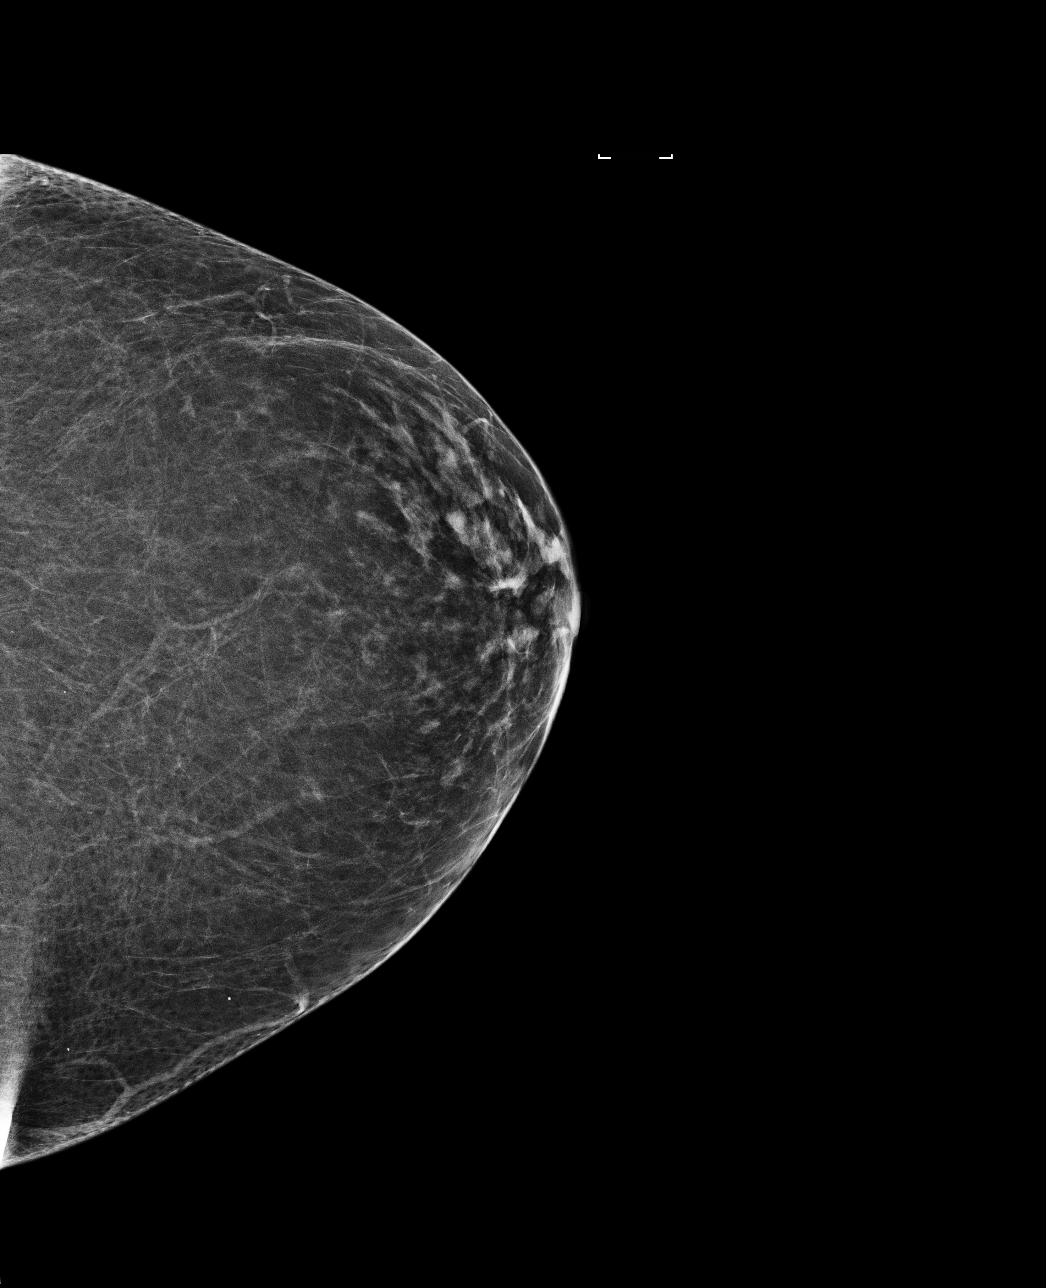

[R MLO]
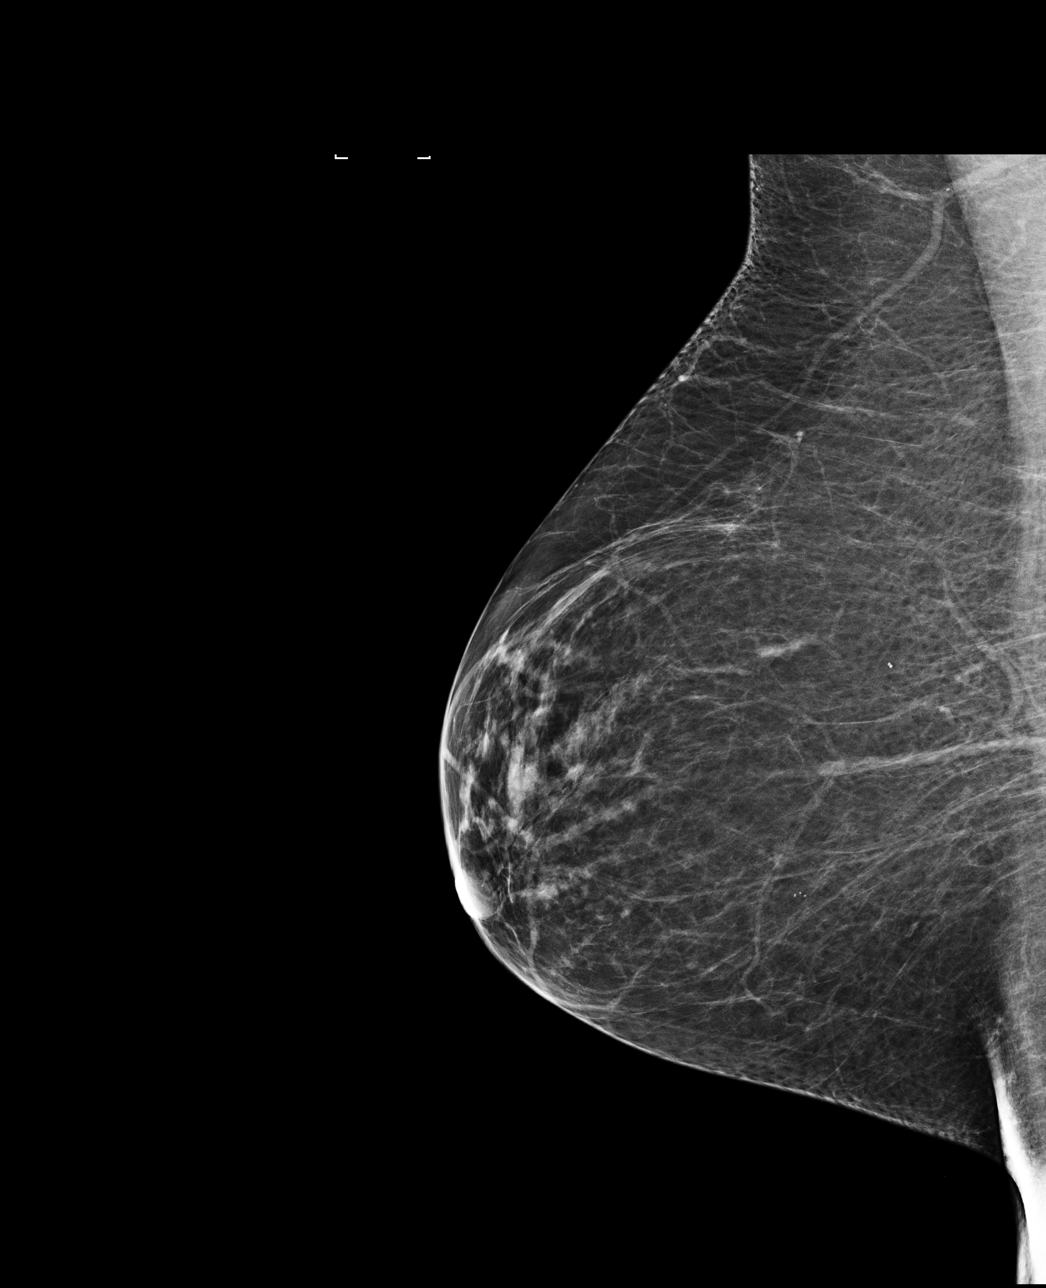

[4 of 4 positions shown; findings below may reference images not displayed]

ACR Breast Density Category b: There are scattered areas of
fibroglandular density.
FINDINGS: There are no findings suspicious for malignancy. Images were
processed with CAD.
IMPRESSION: No mammographic evidence of malignancy. A result letter of this
screening mammogram will be mailed directly to the patient.

RECOMMENDATION:
Screening mammogram in one year. (Code:AS-G-LCT)

BI-RADS CATEGORY  1: Negative.

## 2017-08-04 DIAGNOSIS — I10 Essential (primary) hypertension: Secondary | ICD-10-CM | POA: Diagnosis not present

## 2017-08-04 DIAGNOSIS — G20 Parkinson's disease: Secondary | ICD-10-CM | POA: Diagnosis not present

## 2017-08-04 DIAGNOSIS — J45909 Unspecified asthma, uncomplicated: Secondary | ICD-10-CM | POA: Diagnosis not present

## 2017-08-04 DIAGNOSIS — I471 Supraventricular tachycardia: Secondary | ICD-10-CM | POA: Diagnosis not present

## 2017-08-04 DIAGNOSIS — A0472 Enterocolitis due to Clostridium difficile, not specified as recurrent: Secondary | ICD-10-CM | POA: Diagnosis not present

## 2017-08-04 DIAGNOSIS — M5136 Other intervertebral disc degeneration, lumbar region: Secondary | ICD-10-CM | POA: Diagnosis not present

## 2017-08-04 DIAGNOSIS — M172 Bilateral post-traumatic osteoarthritis of knee: Secondary | ICD-10-CM | POA: Diagnosis not present

## 2017-08-04 DIAGNOSIS — G4733 Obstructive sleep apnea (adult) (pediatric): Secondary | ICD-10-CM | POA: Diagnosis not present

## 2017-08-04 DIAGNOSIS — E43 Unspecified severe protein-calorie malnutrition: Secondary | ICD-10-CM | POA: Diagnosis not present

## 2017-08-04 DIAGNOSIS — E785 Hyperlipidemia, unspecified: Secondary | ICD-10-CM | POA: Diagnosis not present

## 2017-08-05 DIAGNOSIS — M172 Bilateral post-traumatic osteoarthritis of knee: Secondary | ICD-10-CM | POA: Diagnosis not present

## 2017-08-05 DIAGNOSIS — E43 Unspecified severe protein-calorie malnutrition: Secondary | ICD-10-CM | POA: Diagnosis not present

## 2017-08-05 DIAGNOSIS — G20 Parkinson's disease: Secondary | ICD-10-CM | POA: Diagnosis not present

## 2017-08-05 DIAGNOSIS — J45909 Unspecified asthma, uncomplicated: Secondary | ICD-10-CM | POA: Diagnosis not present

## 2017-08-05 DIAGNOSIS — I471 Supraventricular tachycardia: Secondary | ICD-10-CM | POA: Diagnosis not present

## 2017-08-05 DIAGNOSIS — M5136 Other intervertebral disc degeneration, lumbar region: Secondary | ICD-10-CM | POA: Diagnosis not present

## 2017-08-05 DIAGNOSIS — G4733 Obstructive sleep apnea (adult) (pediatric): Secondary | ICD-10-CM | POA: Diagnosis not present

## 2017-08-05 DIAGNOSIS — I10 Essential (primary) hypertension: Secondary | ICD-10-CM | POA: Diagnosis not present

## 2017-08-05 DIAGNOSIS — E785 Hyperlipidemia, unspecified: Secondary | ICD-10-CM | POA: Diagnosis not present

## 2017-08-05 DIAGNOSIS — A0472 Enterocolitis due to Clostridium difficile, not specified as recurrent: Secondary | ICD-10-CM | POA: Diagnosis not present

## 2017-08-09 ENCOUNTER — Other Ambulatory Visit: Payer: Self-pay

## 2017-08-09 ENCOUNTER — Ambulatory Visit
Admission: RE | Admit: 2017-08-09 | Discharge: 2017-08-09 | Disposition: A | Discharge status: Home / Self Care | Payer: Medicare HMO | Source: Ambulatory Visit | Attending: Family Medicine | Admitting: Family Medicine

## 2017-08-09 ENCOUNTER — Telehealth: Payer: Self-pay | Admitting: Gastroenterology

## 2017-08-09 DIAGNOSIS — Z1231 Encounter for screening mammogram for malignant neoplasm of breast: Secondary | ICD-10-CM | POA: Diagnosis not present

## 2017-08-09 DIAGNOSIS — M172 Bilateral post-traumatic osteoarthritis of knee: Secondary | ICD-10-CM | POA: Diagnosis not present

## 2017-08-09 DIAGNOSIS — I10 Essential (primary) hypertension: Secondary | ICD-10-CM | POA: Diagnosis not present

## 2017-08-09 DIAGNOSIS — E43 Unspecified severe protein-calorie malnutrition: Secondary | ICD-10-CM | POA: Diagnosis not present

## 2017-08-09 DIAGNOSIS — M5136 Other intervertebral disc degeneration, lumbar region: Secondary | ICD-10-CM | POA: Diagnosis not present

## 2017-08-09 DIAGNOSIS — I471 Supraventricular tachycardia: Secondary | ICD-10-CM | POA: Diagnosis not present

## 2017-08-09 DIAGNOSIS — G20 Parkinson's disease: Secondary | ICD-10-CM | POA: Diagnosis not present

## 2017-08-09 DIAGNOSIS — G4733 Obstructive sleep apnea (adult) (pediatric): Secondary | ICD-10-CM | POA: Diagnosis not present

## 2017-08-09 DIAGNOSIS — Z1239 Encounter for other screening for malignant neoplasm of breast: Secondary | ICD-10-CM

## 2017-08-09 DIAGNOSIS — N390 Urinary tract infection, site not specified: Secondary | ICD-10-CM | POA: Diagnosis not present

## 2017-08-09 DIAGNOSIS — E785 Hyperlipidemia, unspecified: Secondary | ICD-10-CM | POA: Diagnosis not present

## 2017-08-09 DIAGNOSIS — J45909 Unspecified asthma, uncomplicated: Secondary | ICD-10-CM | POA: Diagnosis not present

## 2017-08-09 MED ORDER — HYDROCORTISONE 2.5 % RE CREA: 1.0000 "application " | TOPICAL_CREAM | Freq: Two times a day (BID) | RECTAL | 1 refills | Status: DC

## 2017-08-09 NOTE — Telephone Encounter (Signed)
*  STAT* If patient is at the pharmacy, call can be transferred to refill team.   1. Which medications need to be refilled? (please list name of each medication and dose if known) Proctozone - hc  2. Which pharmacy/location (including street and city if local pharmacy) is medication to be sent to? [facility] [place]  3. Do they need a 30 day or 90 day supply? [place]

## 2017-08-10 DIAGNOSIS — G4733 Obstructive sleep apnea (adult) (pediatric): Secondary | ICD-10-CM | POA: Diagnosis not present

## 2017-08-10 DIAGNOSIS — M172 Bilateral post-traumatic osteoarthritis of knee: Secondary | ICD-10-CM | POA: Diagnosis not present

## 2017-08-10 DIAGNOSIS — J45909 Unspecified asthma, uncomplicated: Secondary | ICD-10-CM | POA: Diagnosis not present

## 2017-08-10 DIAGNOSIS — E785 Hyperlipidemia, unspecified: Secondary | ICD-10-CM | POA: Diagnosis not present

## 2017-08-10 DIAGNOSIS — I10 Essential (primary) hypertension: Secondary | ICD-10-CM | POA: Diagnosis not present

## 2017-08-10 DIAGNOSIS — N390 Urinary tract infection, site not specified: Secondary | ICD-10-CM | POA: Diagnosis not present

## 2017-08-10 DIAGNOSIS — M5136 Other intervertebral disc degeneration, lumbar region: Secondary | ICD-10-CM | POA: Diagnosis not present

## 2017-08-10 DIAGNOSIS — E43 Unspecified severe protein-calorie malnutrition: Secondary | ICD-10-CM | POA: Diagnosis not present

## 2017-08-10 DIAGNOSIS — G20 Parkinson's disease: Secondary | ICD-10-CM | POA: Diagnosis not present

## 2017-08-10 DIAGNOSIS — I471 Supraventricular tachycardia: Secondary | ICD-10-CM | POA: Diagnosis not present

## 2017-08-11 ENCOUNTER — Other Ambulatory Visit: Payer: Self-pay

## 2017-08-11 ENCOUNTER — Telehealth: Payer: Self-pay | Admitting: Gastroenterology

## 2017-08-11 DIAGNOSIS — M5136 Other intervertebral disc degeneration, lumbar region: Secondary | ICD-10-CM | POA: Diagnosis not present

## 2017-08-11 DIAGNOSIS — J45909 Unspecified asthma, uncomplicated: Secondary | ICD-10-CM | POA: Diagnosis not present

## 2017-08-11 DIAGNOSIS — G4733 Obstructive sleep apnea (adult) (pediatric): Secondary | ICD-10-CM | POA: Diagnosis not present

## 2017-08-11 DIAGNOSIS — N390 Urinary tract infection, site not specified: Secondary | ICD-10-CM | POA: Diagnosis not present

## 2017-08-11 DIAGNOSIS — I10 Essential (primary) hypertension: Secondary | ICD-10-CM | POA: Diagnosis not present

## 2017-08-11 DIAGNOSIS — E785 Hyperlipidemia, unspecified: Secondary | ICD-10-CM | POA: Diagnosis not present

## 2017-08-11 DIAGNOSIS — I471 Supraventricular tachycardia: Secondary | ICD-10-CM | POA: Diagnosis not present

## 2017-08-11 DIAGNOSIS — G20 Parkinson's disease: Secondary | ICD-10-CM | POA: Diagnosis not present

## 2017-08-11 DIAGNOSIS — E43 Unspecified severe protein-calorie malnutrition: Secondary | ICD-10-CM | POA: Diagnosis not present

## 2017-08-11 DIAGNOSIS — M172 Bilateral post-traumatic osteoarthritis of knee: Secondary | ICD-10-CM | POA: Diagnosis not present

## 2017-08-11 MED ORDER — HYDROCORTISONE ACETATE 25 MG RE SUPP: 25.0000 mg | Freq: Every day | RECTAL | 1 refills | Status: AC

## 2017-08-11 NOTE — Telephone Encounter (Signed)
PT NEEDS NEW SCRIPT RX HYDRO CART 25 MG SUPASATORIES SEND TO [place]

## 2017-08-12 ENCOUNTER — Ambulatory Visit (INDEPENDENT_AMBULATORY_CARE_PROVIDER_SITE_OTHER): Payer: Medicare HMO

## 2017-08-12 ENCOUNTER — Ambulatory Visit (INDEPENDENT_AMBULATORY_CARE_PROVIDER_SITE_OTHER): Payer: Medicare HMO | Admitting: Vascular Surgery

## 2017-08-12 ENCOUNTER — Encounter (INDEPENDENT_AMBULATORY_CARE_PROVIDER_SITE_OTHER): Payer: Self-pay | Admitting: Vascular Surgery

## 2017-08-12 VITALS — BP 155/82 | HR 95 | Resp 16 | Ht 60.0 in | Wt 168.0 lb

## 2017-08-12 DIAGNOSIS — I82401 Acute embolism and thrombosis of unspecified deep veins of right lower extremity: Secondary | ICD-10-CM

## 2017-08-12 DIAGNOSIS — E785 Hyperlipidemia, unspecified: Secondary | ICD-10-CM

## 2017-08-12 DIAGNOSIS — A0472 Enterocolitis due to Clostridium difficile, not specified as recurrent: Secondary | ICD-10-CM

## 2017-08-12 DIAGNOSIS — I10 Essential (primary) hypertension: Secondary | ICD-10-CM

## 2017-08-12 MED ORDER — WARFARIN SODIUM 4 MG PO TABS: 4.0000 mg | ORAL_TABLET | Freq: Every day | ORAL | 0 refills | Status: DC

## 2017-08-12 NOTE — Progress Notes (Signed)
MRN : [id number]  Lindsay Stephenson is a 72 y.o. (December 07, 1945) female who presents with chief complaint of  Chief Complaint  Patient presents with  . Follow-up    dvt ultrasound follow up  .  History of Present Illness: Patient returns today in follow up of right leg DVT.  Although she is not near her baseline and mobility, this has continued to improve with continued physical therapy.  Continues on Coumadin and has tolerated this well without any signs of bleeding.  Her last INR is 1.9 and she is taking 4 mg of Coumadin a day.  She says that her primary care physician would like [place] to take over the management of Coumadin which is certainly fine. Duplex today shows what has the appearance of subacute right lower extremity DVT with some chronic appearing DVT as well.  No true acute DVT appears to be present at this point.  Current Outpatient Medications  Medication Sig Dispense Refill  . acetaminophen (TYLENOL) 325 MG tablet Take 650 mg by mouth.    Marland Kitchen albuterol (PROVENTIL HFA;VENTOLIN HFA) 108 (90 Base) MCG/ACT inhaler Inhale 1-2 puffs into the lungs every 6 (six) hours as needed for wheezing or shortness of breath. 1 Inhaler 1  . AMBULATORY NON FORMULARY MEDICATION 1 Units by Other route daily. Walk in bathtub/shower 1 Units 0  . Calcium Carb-Ergocalciferol 250-125 MG-UNIT TABS Take by mouth.    . Calcium Carbonate-Vit D-Min (CALCIUM 1200 PO) Take by mouth.    . folic acid (FOLVITE) 1 MG tablet Take 1 mg by mouth daily.     . hydrocortisone (ANUSOL-HC) 2.5 % rectal cream Place 1 application rectally 2 (two) times daily. 30 g 1  . hydrocortisone (ANUSOL-HC) 25 MG suppository Place 1 suppository (25 mg total) rectally at bedtime. 24 suppository 1  . Hydrocortisone (GERHARDT'S BUTT CREAM) CREA Apply 1 application topically daily. 1 each 0  . levocetirizine (XYZAL) 5 MG tablet Take 1 tablet (5 mg total) by mouth as needed. 90 tablet 1  . methotrexate 2.5 MG tablet Take 10 mg by mouth once a week.  Takes on Fridays    . Multiple Vitamin (MULTIVITAMIN) capsule Take 1 capsule by mouth daily.    Marland Kitchen omeprazole (PRILOSEC) 20 MG capsule Take 1 capsule (20 mg total) by mouth daily. 90 capsule 3  . potassium chloride SA (K-DUR,KLOR-CON) 20 MEQ tablet Take 1 tablet (20 mEq total) by mouth 2 (two) times daily. 60 tablet 1  . predniSONE (DELTASONE) 5 MG tablet Take 7.5 mg by mouth.     . Probiotic Product (TRUNATURE PROBIOTIC FOR KIDS) CHEW Chew 1 each by mouth daily.    Marland Kitchen warfarin (COUMADIN) 4 MG tablet Take 1 tablet (4 mg total) by mouth daily. 30 tablet 0  . ciprofloxacin (CIPRO) 250 MG tablet Take 1 tablet (250 mg total) by mouth 2 (two) times daily. (Patient not taking: Reported on 08/12/2017) 6 tablet 0  . nitrofurantoin, macrocrystal-monohydrate, (MACROBID) 100 MG capsule Take 1 capsule (100 mg total) by mouth 2 (two) times daily. (Patient not taking: Reported on 08/12/2017) 14 capsule 0   No current facility-administered medications for this visit.     Past Medical History:  Diagnosis Date  . Allergy   . Anemia    TAKING IRON  . Arthritis    KNEES AND HANDS  . Asthma    COUGHING, NO ATTACKS IN A YEAR OR 2  . Dyspnea    ON EXERTION  . GERD (gastroesophageal reflux disease)   .  HOH (hard of hearing)    30-35 % HEARING LOSS  . Hyperlipidemia   . Hypertension    HX OF  . LABD (linear immunoglobulin A bullous dermatosis)   . Mitral valve prolapse   . Mitral valve prolapse   . 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 Parkinson's disease ([place])   . Rectal bleeding   . Sleep apnea    CPAP  . Vertigo     Past Surgical History:  Procedure Laterality Date  . ABDOMINAL HYSTERECTOMY    . APPENDECTOMY    . COLONOSCOPY WITH PROPOFOL N/A 01/16/2016   Procedure: COLONOSCOPY WITH PROPOFOL;  Surgeon: Lucilla Lame, MD;  Location: [facility];  Service: Endoscopy;  Laterality: N/A;  CPAP  . KNEE SURGERY     2013 and 2014  . POLYPECTOMY  01/16/2016     Procedure: POLYPECTOMY;  Surgeon: Lucilla Lame, MD;  Location: [facility];  Service: Endoscopy;;  . REPLACEMENT TOTAL KNEE BILATERAL Bilateral    2013, 2014  . TONSILLECTOMY     ADENOIDECTOMY          Family History  Problem Relation Age of Onset  . Osteoporosis Mother   . Chronic Renal Failure Father   . Arthritis/Rheumatoid Sister   . Stroke Sister   . Fibromyalgia Sister   . Breast cancer Sister 30  . Diabetes Brother     Social History Social History        Tobacco Use  . Smoking status: Former Smoker    Years: 3.00    Types: Cigarettes    Last attempt to quit: 1969    Years since quitting: 50.2  . Smokeless tobacco: Never Used  Substance Use Topics  . Alcohol use: No    Comment: 4-5 times a year  . Drug use: No         Allergies  Allergen Reactions  . Entex Lq [Phenylephrine-Guaifenesin] Swelling    TONGUE  . Phenylephrine-Guaifenesin Swelling  . Amoxicillin-Pot Clavulanate     Other reaction(s): Other (See Comments) Loss of appetite, weakness  . Codeine Hives and Other (See Comments)    Pt reports only having symptoms after receiving large amounts of medication  . Sulfa Antibiotics Itching  . Tape Other (See Comments)    CAN PULL OFF LAYER OF SKIN      REVIEW OF SYSTEMS (Negative unless checked)  Constitutional: _0 Weight loss  _1 Fever  _2 Chills Cardiac: _3 Chest pain   _4 Chest pressure   _5 Palpitations   _6 Shortness of breath when laying flat   _7 Shortness of breath at rest   _8 Shortness of breath with exertion. Vascular:  _9 Pain in legs with walking   _10 Pain in legs at rest   _11 Pain in legs when laying flat   _12 Claudication   _13 Pain in feet when walking  _14 Pain in feet at rest  _15 Pain in feet when laying flat   _16 History of DVT   _17 Phlebitis   _18 Swelling in legs   _19 Varicose veins   _20 Non-healing ulcers Pulmonary:   _21 Uses home oxygen   _22 Productive cough   _23 Hemoptysis   _24 Wheeze  _25 COPD    _26 Asthma Neurologic:  _27 Dizziness  _28 Blackouts   _29 Seizures   _30 History of stroke   _31 History of TIA  _32 Aphasia   _33 Temporary blindness   _34 Dysphagia   _35 Weakness or numbness in arms   _36 Weakness or numbness in legs Musculoskeletal:  _37 Arthritis   _38 Joint swelling   _39 Joint pain   _40 Low back pain Hematologic:  _41   Easy bruising  _0 Easy bleeding   _1 Hypercoagulable state   _2 Anemic  _3 Hepatitis Gastrointestinal:  _4 Blood in stool   _5 Vomiting blood  _6 Gastroesophageal reflux/heartburn   _7 Abdominal pain Genitourinary:  _8 Chronic kidney disease   _9 Difficult urination  _10 Frequent urination  _11 Burning with urination   _12 Hematuria Skin:  _13 Rashes   _14 Ulcers   _15 Wounds Psychological:  _16 History of anxiety   _17  History of major depression.    Physical Examination  BP (!) 155/82 (BP Location: Left Arm)   Pulse 95   Resp 16   Ht 5' (1.524 m)   Wt 168 lb (76.2 kg)   BMI 32.81 kg/m  Gen:  WD/WN, NAD Head: [place]/AT, No temporalis wasting. Ear/Nose/Throat: Hearing grossly intact, nares w/o erythema or drainage Eyes: Conjunctiva clear. Sclera non-icteric Neck: Supple.  Trachea midline Pulmonary:  Good air movement, no use of accessory muscles.  Cardiac: RRR, no JVD Vascular:  Vessel Right Left  Radial Palpable Palpable                          PT  1+ palpable  1+ palpable  DP Palpable Palpable    Musculoskeletal: M/S 5/5 throughout.  No deformity or atrophy.  Using a wheelchair today.  1+ right lower extremity edema, trace left lower extremity edema. Neurologic: Sensation grossly intact in extremities.  Symmetrical.  Speech is fluent.  Psychiatric: Judgment intact, Mood & affect appropriate for pt's clinical situation. Dermatologic: No rashes or ulcers noted.  No cellulitis or open wounds.       Labs Recent Results (from the past 2160 hour(s))  Gastrointestinal Panel by PCR , Stool     Status: None   Collection Time: 05/15/17  2:41 PM  Result Value Ref Range   Campylobacter  species NOT DETECTED NOT DETECTED   Plesimonas shigelloides NOT DETECTED NOT DETECTED   Salmonella species NOT DETECTED NOT DETECTED   Yersinia enterocolitica NOT DETECTED NOT DETECTED   Vibrio species NOT DETECTED NOT DETECTED   Vibrio cholerae NOT DETECTED NOT DETECTED   Enteroaggregative E coli (EAEC) NOT DETECTED NOT DETECTED   Enteropathogenic E coli (EPEC) NOT DETECTED NOT DETECTED   Enterotoxigenic E coli (ETEC) NOT DETECTED NOT DETECTED   Shiga like toxin producing E coli (STEC) NOT DETECTED NOT DETECTED   Shigella/Enteroinvasive E coli (EIEC) NOT DETECTED NOT DETECTED   Cryptosporidium NOT DETECTED NOT DETECTED   Cyclospora cayetanensis NOT DETECTED NOT DETECTED   Entamoeba histolytica NOT DETECTED NOT DETECTED   Giardia lamblia NOT DETECTED NOT DETECTED   Adenovirus F40/41 NOT DETECTED NOT DETECTED   Astrovirus NOT DETECTED NOT DETECTED   Norovirus GI/GII NOT DETECTED NOT DETECTED   Rotavirus A NOT DETECTED NOT DETECTED   Sapovirus (I, II, IV, and V) NOT DETECTED NOT DETECTED    Comment: Performed at [facility], [address] [place], [place] [address]  Lactic acid, plasma     Status: None   Collection Time: 05/15/17  3:47 PM  Result Value Ref Range   Lactic Acid, Venous 1.1 0.5 - 1.9 mmol/L    Comment: Performed at [facility], [address] [place], [place] [address]  CBC with Differential     Status: Abnormal   Collection Time: 05/15/17  3:47 PM  Result Value Ref Range   WBC 10.4 3.6 - 11.0 K/uL   RBC 4.67 3.80 - 5.20 MIL/uL   Hemoglobin 14.7 12.0 - 16.0 g/dL   HCT 43.9 35.0 - 47.0 %  MCV 93.8 80.0 - 100.0 fL   MCH 31.4 26.0 - 34.0 pg   MCHC 33.5 32.0 - 36.0 g/dL   RDW 15.1 (H) 11.5 - 14.5 %   Platelets 189 150 - 440 K/uL   Neutrophils Relative % 78 %   Neutro Abs 8.2 (H) 1.4 - 6.5 K/uL   Lymphocytes Relative 10 %   Lymphs Abs 1.0 1.0 - 3.6 K/uL   Monocytes Relative 10 %   Monocytes Absolute 1.0 (H) 0.2 - 0.9 K/uL   Eosinophils  Relative 1 %   Eosinophils Absolute 0.0 0 - 0.7 K/uL   Basophils Relative 1 %   Basophils Absolute 0.1 0 - 0.1 K/uL    Comment: Performed at [facility], [address] [place], [place] [address]  Urinalysis, Complete w Microscopic     Status: Abnormal   Collection Time: 05/15/17  3:48 PM  Result Value Ref Range   Color, Urine YELLOW (A) YELLOW   APPearance CLEAR (A) CLEAR   Specific Gravity, Urine >1.046 (H) 1.005 - 1.030   pH 6.0 5.0 - 8.0   Glucose, UA NEGATIVE NEGATIVE mg/dL   Hgb urine dipstick LARGE (A) NEGATIVE   Bilirubin Urine NEGATIVE NEGATIVE   Ketones, ur 80 (A) NEGATIVE mg/dL   Protein, ur NEGATIVE NEGATIVE mg/dL   Nitrite NEGATIVE NEGATIVE   Leukocytes, UA TRACE (A) NEGATIVE   RBC / HPF 6-30 0 - 5 RBC/hpf   WBC, UA 0-5 0 - 5 WBC/hpf   Bacteria, UA NONE SEEN NONE SEEN   Squamous Epithelial / LPF NONE SEEN NONE SEEN   Mucus PRESENT    Hyaline Casts, UA PRESENT     Comment: Performed at [facility], [address] [place], [place] [address]  Comprehensive metabolic panel     Status: Abnormal   Collection Time: 05/15/17  5:58 PM  Result Value Ref Range   Sodium 139 135 - 145 mmol/L   Potassium 3.2 (L) 3.5 - 5.1 mmol/L    Comment: HEMOLYSIS AT THIS LEVEL MAY AFFECT RESULT   Chloride 106 101 - 111 mmol/L   CO2 18 (L) 22 - 32 mmol/L   Glucose, Bld 88 65 - 99 mg/dL   BUN <5 (L) 6 - 20 mg/dL   Creatinine, Ser 0.56 0.44 - 1.00 mg/dL   Calcium 8.3 (L) 8.9 - 10.3 mg/dL   Total Protein 4.9 (L) 6.5 - 8.1 g/dL   Albumin 2.5 (L) 3.5 - 5.0 g/dL   AST 18 15 - 41 U/L    Comment: HEMOLYSIS AT THIS LEVEL MAY AFFECT RESULT   ALT 9 (L) 14 - 54 U/L   Alkaline Phosphatase 48 38 - 126 U/L   Total Bilirubin 1.4 (H) 0.3 - 1.2 mg/dL    Comment: HEMOLYSIS AT THIS LEVEL MAY AFFECT RESULT   GFR calc non Af Amer >60 >60 mL/min   GFR calc Af Amer >60 >60 mL/min    Comment: (NOTE) The eGFR has been calculated using the CKD EPI equation. This calculation has not  been validated in all clinical situations. eGFR's persistently <60 mL/min signify possible Chronic Kidney Disease.    Anion gap 15 5 - 15    Comment: Performed at [facility], [address] [place], [place] [address]  Lipase, blood     Status: None   Collection Time: 05/15/17  5:58 PM  Result Value Ref Range   Lipase 40 11 - 51 U/L    Comment: Performed at [facility],  [place], [place] [address]  Magnesium     Status: Abnormal   Collection Time: 05/15/17  5:58 PM  Result Value Ref Range   Magnesium 1.6 (L) 1.7 - 2.4 mg/dL    Comment: Performed at [facility], [address] [place], [place] [address]  Basic metabolic panel     Status: Abnormal   Collection Time: 05/16/17  4:15 AM  Result Value Ref Range   Sodium 138 135 - 145 mmol/L   Potassium 2.1 (LL) 3.5 - 5.1 mmol/L    Comment: CRITICAL RESULT CALLED TO, READ BACK BY AND VERIFIED WITH [facility] AT 2423 05/16/17.PMH   Chloride 106 101 - 111 mmol/L   CO2 18 (L) 22 - 32 mmol/L   Glucose, Bld 82 65 - 99 mg/dL   BUN <5 (L) 6 - 20 mg/dL   Creatinine, Ser 0.48 0.44 - 1.00 mg/dL   Calcium 7.9 (L) 8.9 - 10.3 mg/dL   GFR calc non Af Amer >60 >60 mL/min   GFR calc Af Amer >60 >60 mL/min    Comment: (NOTE) The eGFR has been calculated using the CKD EPI equation. This calculation has not been validated in all clinical situations. eGFR's persistently <60 mL/min signify possible Chronic Kidney Disease.    Anion gap 14 5 - 15    Comment: Performed at [facility], [address] [place], [place] [address]  CBC     Status: Abnormal   Collection Time: 05/16/17  4:15 AM  Result Value Ref Range   WBC 7.6 3.6 - 11.0 K/uL   RBC 4.00 3.80 - 5.20 MIL/uL   Hemoglobin 12.7 12.0 - 16.0 g/dL   HCT 36.9 35.0 - 47.0 %   MCV 92.2 80.0 - 100.0 fL   MCH 31.7 26.0 - 34.0 pg   MCHC 34.4 32.0 - 36.0 g/dL   RDW 14.8 (H) 11.5 - 14.5 %   Platelets 158 150 - 440 K/uL    Comment: Performed  at [facility], [address], [place], [place] [address]  Basic metabolic panel     Status: Abnormal   Collection Time: 05/17/17  5:38 AM  Result Value Ref Range   Sodium 138 135 - 145 mmol/L   Potassium 2.5 (LL) 3.5 - 5.1 mmol/L    Comment: CRITICAL RESULT CALLED TO, READ BACK BY AND VERIFIED WITH STACY CLAY AT 0656 ON 05/17/17 [place].    Chloride 108 101 - 111 mmol/L   CO2 21 (L) 22 - 32 mmol/L   Glucose, Bld 82 65 - 99 mg/dL   BUN <5 (L) 6 - 20 mg/dL   Creatinine, Ser 0.49 0.44 - 1.00 mg/dL   Calcium 7.2 (L) 8.9 - 10.3 mg/dL   GFR calc non Af Amer >60 >60 mL/min   GFR calc Af Amer >60 >60 mL/min    Comment: (NOTE) The eGFR has been calculated using the CKD EPI equation. This calculation has not been validated in all clinical situations. eGFR's persistently <60 mL/min signify possible Chronic Kidney Disease.    Anion gap 9 5 - 15    Comment: Performed at [facility], [address] [place], [place] [address]  CBC     Status: Abnormal   Collection Time: 05/17/17  5:38 AM  Result Value Ref Range   WBC 6.5 3.6 - 11.0 K/uL   RBC 4.02 3.80 - 5.20 MIL/uL   Hemoglobin 12.9 12.0 - 16.0 g/dL   HCT 37.1 35.0 - 47.0 %  MCV 92.2 80.0 - 100.0 fL   MCH 32.0 26.0 - 34.0 pg   MCHC 34.7 32.0 - 36.0 g/dL   RDW 15.2 (H) 11.5 - 14.5 %   Platelets 139 (L) 150 - 440 K/uL    Comment: Performed at [facility], [address] [place], [place] [address]  Phosphorus     Status: Abnormal   Collection Time: 05/17/17  5:38 AM  Result Value Ref Range   Phosphorus 1.4 (L) 2.5 - 4.6 mg/dL    Comment: Performed at [facility], [address] [place], [place] [address]  Magnesium     Status: None   Collection Time: 05/17/17  5:38 AM  Result Value Ref Range   Magnesium 1.8 1.7 - 2.4 mg/dL    Comment: Performed at [facility], [address] [place], [place] [address]  Basic metabolic panel     Status: Abnormal   Collection Time: 05/18/17  5:50  AM  Result Value Ref Range   Sodium 140 135 - 145 mmol/L   Potassium 3.0 (L) 3.5 - 5.1 mmol/L   Chloride 108 101 - 111 mmol/L   CO2 22 22 - 32 mmol/L   Glucose, Bld 88 65 - 99 mg/dL   BUN <5 (L) 6 - 20 mg/dL   Creatinine, Ser 0.50 0.44 - 1.00 mg/dL   Calcium 7.4 (L) 8.9 - 10.3 mg/dL   GFR calc non Af Amer >60 >60 mL/min   GFR calc Af Amer >60 >60 mL/min    Comment: (NOTE) The eGFR has been calculated using the CKD EPI equation. This calculation has not been validated in all clinical situations. eGFR's persistently <60 mL/min signify possible Chronic Kidney Disease.    Anion gap 10 5 - 15    Comment: Performed at [facility], [address] [place], [place] [address]  Phosphorus     Status: Abnormal   Collection Time: 05/18/17  5:50 AM  Result Value Ref Range   Phosphorus 1.5 (L) 2.5 - 4.6 mg/dL    Comment: Performed at [facility], [address] [place], [place] [address]  Magnesium     Status: Abnormal   Collection Time: 05/18/17  5:50 AM  Result Value Ref Range   Magnesium 1.6 (L) 1.7 - 2.4 mg/dL    Comment: Performed at [facility], [address] [place], [place] [address]  Basic metabolic panel     Status: Abnormal   Collection Time: 05/19/17  4:53 AM  Result Value Ref Range   Sodium 140 135 - 145 mmol/L   Potassium 3.3 (L) 3.5 - 5.1 mmol/L   Chloride 108 101 - 111 mmol/L   CO2 25 22 - 32 mmol/L   Glucose, Bld 82 65 - 99 mg/dL   BUN <5 (L) 6 - 20 mg/dL   Creatinine, Ser 0.41 (L) 0.44 - 1.00 mg/dL   Calcium 7.0 (L) 8.9 - 10.3 mg/dL   GFR calc non Af Amer >60 >60 mL/min   GFR calc Af Amer >60 >60 mL/min    Comment: (NOTE) The eGFR has been calculated using the CKD EPI equation. This calculation has not been validated in all clinical situations. eGFR's persistently <60 mL/min signify possible Chronic Kidney Disease.    Anion gap 7 5 - 15    Comment: Performed at [facility], [address] [place], [place] [address]   Phosphorus     Status: Abnormal   Collection Time: 05/19/17  4:53 AM  Result  Value Ref Range   Phosphorus 2.2 (L) 2.5 - 4.6 mg/dL    Comment: Performed at [facility], [address] [place], [place] [address]  Magnesium     Status: None   Collection Time: 05/19/17  4:53 AM  Result Value Ref Range   Magnesium 1.7 1.7 - 2.4 mg/dL    Comment: Performed at [facility], [address] [place], [place] [address]  Basic metabolic panel     Status: Abnormal   Collection Time: 05/20/17  6:49 AM  Result Value Ref Range   Sodium 139 135 - 145 mmol/L   Potassium 3.1 (L) 3.5 - 5.1 mmol/L   Chloride 104 101 - 111 mmol/L   CO2 25 22 - 32 mmol/L   Glucose, Bld 82 65 - 99 mg/dL   BUN <5 (L) 6 - 20 mg/dL   Creatinine, Ser 0.38 (L) 0.44 - 1.00 mg/dL   Calcium 7.5 (L) 8.9 - 10.3 mg/dL   GFR calc non Af Amer >60 >60 mL/min   GFR calc Af Amer >60 >60 mL/min    Comment: (NOTE) The eGFR has been calculated using the CKD EPI equation. This calculation has not been validated in all clinical situations. eGFR's persistently <60 mL/min signify possible Chronic Kidney Disease.    Anion gap 10 5 - 15    Comment: Performed at [facility], [address] [place], [place] [address]  CBC     Status: Abnormal   Collection Time: 05/20/17  6:49 AM  Result Value Ref Range   WBC 7.8 3.6 - 11.0 K/uL   RBC 4.13 3.80 - 5.20 MIL/uL   Hemoglobin 12.9 12.0 - 16.0 g/dL   HCT 38.4 35.0 - 47.0 %   MCV 93.1 80.0 - 100.0 fL   MCH 31.3 26.0 - 34.0 pg   MCHC 33.6 32.0 - 36.0 g/dL   RDW 15.8 (H) 11.5 - 14.5 %   Platelets 116 (L) 150 - 440 K/uL    Comment: Performed at [facility], [address], [place], [place] [address]  Phosphorus     Status: None   Collection Time: 05/20/17  6:49 AM  Result Value Ref Range   Phosphorus 2.7 2.5 - 4.6 mg/dL    Comment: Performed at [facility], [address] [place], [place] [address]  Magnesium     Status: Abnormal    Collection Time: 05/20/17  6:49 AM  Result Value Ref Range   Magnesium 1.5 (L) 1.7 - 2.4 mg/dL    Comment: Performed at [facility], [address] [place], [place] [address]  Basic metabolic panel     Status: Abnormal   Collection Time: 05/21/17  4:09 AM  Result Value Ref Range   Sodium 138 135 - 145 mmol/L   Potassium 3.0 (L) 3.5 - 5.1 mmol/L   Chloride 104 101 - 111 mmol/L   CO2 23 22 - 32 mmol/L   Glucose, Bld 79 65 - 99 mg/dL   BUN <5 (L) 6 - 20 mg/dL   Creatinine, Ser <0.30 (L) 0.44 - 1.00 mg/dL   Calcium 7.3 (L) 8.9 - 10.3 mg/dL   GFR calc non Af Amer NOT CALCULATED >60 mL/min   GFR calc Af Amer NOT CALCULATED >60 mL/min    Comment: (NOTE) The eGFR has been calculated using the CKD EPI equation. This calculation has not been validated in all clinical situations. eGFR's persistently <60 mL/min signify possible Chronic Kidney Disease.    Anion gap  11 5 - 15    Comment: Performed at [facility], [address] [place], [place] [address]  Magnesium     Status: Abnormal   Collection Time: 05/21/17  4:09 AM  Result Value Ref Range   Magnesium 1.6 (L) 1.7 - 2.4 mg/dL    Comment: Performed at [facility], [address] [place], [place] [address]  Creatinine, serum     Status: Abnormal   Collection Time: 05/22/17  4:51 AM  Result Value Ref Range   Creatinine, Ser 0.31 (L) 0.44 - 1.00 mg/dL   GFR calc non Af Amer >60 >60 mL/min   GFR calc Af Amer >60 >60 mL/min    Comment: (NOTE) The eGFR has been calculated using the CKD EPI equation. This calculation has not been validated in all clinical situations. eGFR's persistently <60 mL/min signify possible Chronic Kidney Disease. Performed at [facility], [address] [place], [place] [address]   Potassium     Status: None   Collection Time: 05/22/17  4:51 AM  Result Value Ref Range   Potassium 3.6 3.5 - 5.1 mmol/L    Comment: Performed at [facility], [address] [place], [place] [address]  Magnesium     Status: None   Collection Time: 05/22/17  4:51 AM  Result Value Ref Range   Magnesium 1.8 1.7 - 2.4 mg/dL    Comment: Performed at [facility], [address] [place], [place] [address]  Phosphorus     Status: Abnormal   Collection Time: 05/22/17  4:51 AM  Result Value Ref Range   Phosphorus 2.4 (L) 2.5 - 4.6 mg/dL    Comment: Performed at [facility], [address] [place], [place] [address]  Basic metabolic panel     Status: Abnormal   Collection Time: 05/23/17  3:03 AM  Result Value Ref Range   Sodium 137 135 - 145 mmol/L   Potassium 3.1 (L) 3.5 - 5.1 mmol/L   Chloride 103 101 - 111 mmol/L   CO2 25 22 - 32 mmol/L   Glucose, Bld 82 65 - 99 mg/dL   BUN 7 6 - 20 mg/dL   Creatinine, Ser 0.35 (L) 0.44 - 1.00 mg/dL   Calcium 7.1 (L) 8.9 - 10.3 mg/dL   GFR calc non Af Amer >60 >60 mL/min   GFR calc Af Amer >60 >60 mL/min    Comment: (NOTE) The eGFR has been calculated using the CKD EPI equation. This calculation has not been validated in all clinical situations. eGFR's persistently <60 mL/min signify possible Chronic Kidney Disease.    Anion gap 9 5 - 15    Comment: Performed at [facility], [address] [place], [place] [address]  Magnesium     Status: Abnormal   Collection Time: 05/23/17  3:03 AM  Result Value Ref Range   Magnesium 1.5 (L) 1.7 - 2.4 mg/dL    Comment: Performed at [facility], [address] [place], [place] [address]  Phosphorus     Status: None   Collection Time: 05/23/17  3:03 AM  Result Value Ref Range   Phosphorus 3.2 2.5 - 4.6 mg/dL    Comment: Performed at [facility], [address] [place], [place] [address]  CBC     Status: Abnormal   Collection Time: 05/23/17  3:03 AM  Result Value Ref Range   WBC 7.1 3.6 - 11.0 K/uL   RBC 3.85 3.80 - 5.20 MIL/uL  Hemoglobin 12.3 12.0 - 16.0 g/dL   HCT 36.1 35.0 - 47.0 %   MCV 93.8 80.0 - 100.0 fL    MCH 31.9 26.0 - 34.0 pg   MCHC 34.1 32.0 - 36.0 g/dL   RDW 16.0 (H) 11.5 - 14.5 %   Platelets 62 (L) 150 - 440 K/uL    Comment: Performed at [facility], [address] [place], [place] [address]  Basic metabolic panel     Status: Abnormal   Collection Time: 05/24/17  5:43 AM  Result Value Ref Range   Sodium 135 135 - 145 mmol/L   Potassium 3.2 (L) 3.5 - 5.1 mmol/L   Chloride 102 101 - 111 mmol/L   CO2 25 22 - 32 mmol/L   Glucose, Bld 80 65 - 99 mg/dL   BUN 8 6 - 20 mg/dL   Creatinine, Ser <0.30 (L) 0.44 - 1.00 mg/dL   Calcium 7.0 (L) 8.9 - 10.3 mg/dL   GFR calc non Af Amer NOT CALCULATED >60 mL/min   GFR calc Af Amer NOT CALCULATED >60 mL/min    Comment: (NOTE) The eGFR has been calculated using the CKD EPI equation. This calculation has not been validated in all clinical situations. eGFR's persistently <60 mL/min signify possible Chronic Kidney Disease.    Anion gap 8 5 - 15    Comment: Performed at [facility], [address] [place], [place] [address]  Magnesium     Status: None   Collection Time: 05/24/17  5:43 AM  Result Value Ref Range   Magnesium 1.7 1.7 - 2.4 mg/dL    Comment: Performed at [facility], [address] [place], [place] [address]  Phosphorus     Status: None   Collection Time: 05/24/17  5:43 AM  Result Value Ref Range   Phosphorus 2.6 2.5 - 4.6 mg/dL    Comment: Performed at [facility], [address] [place], [place] [address]  CBC     Status: Abnormal   Collection Time: 05/24/17  8:20 AM  Result Value Ref Range   WBC 8.3 3.6 - 11.0 K/uL   RBC 3.80 3.80 - 5.20 MIL/uL   Hemoglobin 12.1 12.0 - 16.0 g/dL   HCT 35.9 35.0 - 47.0 %   MCV 94.4 80.0 - 100.0 fL   MCH 31.8 26.0 - 34.0 pg   MCHC 33.7 32.0 - 36.0 g/dL   RDW 16.1 (H) 11.5 - 14.5 %   Platelets 61 (L) 150 - 440 K/uL    Comment: Performed at [facility], [address] [place], [place] [address]  C difficile quick scan w PCR reflex      Status: None   Collection Time: 06/06/17  8:30 AM  Result Value Ref Range   C Diff antigen NEGATIVE NEGATIVE   C Diff toxin NEGATIVE NEGATIVE   C Diff interpretation No C. difficile detected.     Comment: Performed at [facility], [address] [place],  [address]  INR/PT     Status: Abnormal   Collection Time: 06/15/17 11:12 AM  Result Value Ref Range   INR 3.5 (H) 0.8 - 1.2    Comment: Reference interval is for non-anticoagulated patients. Suggested INR therapeutic range for Vitamin K antagonist therapy:    Standard Dose (moderate intensity                   therapeutic range):       2.0 - 3.0  Higher intensity therapeutic range       2.5 - 3.5    Prothrombin Time 33.7 (H) 9.1 - 12.0 sec  CBC with Differential/Platelet     Status: Abnormal   Collection Time: 07/08/17  3:48 PM  Result Value Ref Range   WBC 10.3 3.4 - 10.8 x10E3/uL   RBC 4.30 3.77 - 5.28 x10E6/uL   Hemoglobin 14.3 11.1 - 15.9 g/dL   Hematocrit 42.7 34.0 - 46.6 %   MCV 99 (H) 79 - 97 fL   MCH 33.3 (H) 26.6 - 33.0 pg   MCHC 33.5 31.5 - 35.7 g/dL   RDW 17.2 (H) 12.3 - 15.4 %   Platelets 307 150 - 379 x10E3/uL   Neutrophils 89 Not Estab. %   Lymphs 8 Not Estab. %   Monocytes 3 Not Estab. %   Eos 0 Not Estab. %   Basos 0 Not Estab. %   Neutrophils Absolute 9.2 (H) 1.4 - 7.0 x10E3/uL   Lymphocytes Absolute 0.8 0.7 - 3.1 x10E3/uL   Monocytes Absolute 0.3 0.1 - 0.9 x10E3/uL   EOS (ABSOLUTE) 0.0 0.0 - 0.4 x10E3/uL   Basophils Absolute 0.0 0.0 - 0.2 x10E3/uL   Immature Granulocytes 0 Not Estab. %   Immature Grans (Abs) 0.0 0.0 - 0.1 x10E3/uL  INR/PT     Status: None   Collection Time: 07/27/17  3:53 PM  Result Value Ref Range   INR 1.1 0.8 - 1.2    Comment: Reference interval is for non-anticoagulated patients. Suggested INR therapeutic range for Vitamin K antagonist therapy:    Standard Dose (moderate intensity                   therapeutic range):       2.0 - 3.0    Higher intensity  therapeutic range       2.5 - 3.5    Prothrombin Time 11.8 9.1 - 12.0 sec    Radiology Mammogram Digital Screening  Result Date: 08/10/2017 CLINICAL DATA:  Screening. EXAM: DIGITAL SCREENING BILATERAL MAMMOGRAM WITH CAD COMPARISON:  Previous exam(s). ACR Breast Density Category b: There are scattered areas of fibroglandular density. FINDINGS: There are no findings suspicious for malignancy. Images were processed with CAD. IMPRESSION: No mammographic evidence of malignancy. A result letter of this screening mammogram will be mailed directly to the patient. RECOMMENDATION: Screening mammogram in one year. (Code:SM-B-01Y) BI-RADS CATEGORY  1: Negative. Electronically Signed   By: Franki Cabot M.D.   On: 08/10/2017 13:27    Assessment/Plan Hypertension, essential, benign blood pressure control important in reducing the progression of atherosclerotic disease. On appropriate oral medications.   Clostridium difficile colitis Improved but debilitated her for some time.  Hyperlipidemia with target LDL less than 100 lipid control important in reducing the progression of atherosclerotic disease. 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  This was fairly extensive and she is still not at her normal mobility.  She should get a full 6 months of anticoagulation.  Continue to increase her activity.  I will see her back in about 2 months.    Leotis Pain, MD  08/12/2017 12:42 PM    This note was created with Dragon medical transcription system.  Any errors from dictation are purely unintentional

## 2017-08-12 NOTE — Assessment & Plan Note (Signed)
Her duplex is showing subacute to chronic appearing DVT.  This was fairly extensive and she is still not at her normal mobility.  She should get a full 6 months of anticoagulation.  Continue to increase her activity.  I will see her back in about 2 months.

## 2017-08-15 DIAGNOSIS — M172 Bilateral post-traumatic osteoarthritis of knee: Secondary | ICD-10-CM | POA: Diagnosis not present

## 2017-08-15 DIAGNOSIS — I471 Supraventricular tachycardia: Secondary | ICD-10-CM | POA: Diagnosis not present

## 2017-08-15 DIAGNOSIS — N390 Urinary tract infection, site not specified: Secondary | ICD-10-CM | POA: Diagnosis not present

## 2017-08-15 DIAGNOSIS — J45909 Unspecified asthma, uncomplicated: Secondary | ICD-10-CM | POA: Diagnosis not present

## 2017-08-15 DIAGNOSIS — E785 Hyperlipidemia, unspecified: Secondary | ICD-10-CM | POA: Diagnosis not present

## 2017-08-15 DIAGNOSIS — I10 Essential (primary) hypertension: Secondary | ICD-10-CM | POA: Diagnosis not present

## 2017-08-15 DIAGNOSIS — G4733 Obstructive sleep apnea (adult) (pediatric): Secondary | ICD-10-CM | POA: Diagnosis not present

## 2017-08-15 DIAGNOSIS — M5136 Other intervertebral disc degeneration, lumbar region: Secondary | ICD-10-CM | POA: Diagnosis not present

## 2017-08-15 DIAGNOSIS — E43 Unspecified severe protein-calorie malnutrition: Secondary | ICD-10-CM | POA: Diagnosis not present

## 2017-08-15 DIAGNOSIS — G20 Parkinson's disease: Secondary | ICD-10-CM | POA: Diagnosis not present

## 2017-08-18 DIAGNOSIS — N390 Urinary tract infection, site not specified: Secondary | ICD-10-CM | POA: Diagnosis not present

## 2017-08-18 DIAGNOSIS — M172 Bilateral post-traumatic osteoarthritis of knee: Secondary | ICD-10-CM | POA: Diagnosis not present

## 2017-08-18 DIAGNOSIS — I10 Essential (primary) hypertension: Secondary | ICD-10-CM | POA: Diagnosis not present

## 2017-08-18 DIAGNOSIS — I471 Supraventricular tachycardia: Secondary | ICD-10-CM | POA: Diagnosis not present

## 2017-08-18 DIAGNOSIS — G20 Parkinson's disease: Secondary | ICD-10-CM | POA: Diagnosis not present

## 2017-08-18 DIAGNOSIS — E43 Unspecified severe protein-calorie malnutrition: Secondary | ICD-10-CM | POA: Diagnosis not present

## 2017-08-18 DIAGNOSIS — J45909 Unspecified asthma, uncomplicated: Secondary | ICD-10-CM | POA: Diagnosis not present

## 2017-08-18 DIAGNOSIS — M5136 Other intervertebral disc degeneration, lumbar region: Secondary | ICD-10-CM | POA: Diagnosis not present

## 2017-08-18 DIAGNOSIS — E785 Hyperlipidemia, unspecified: Secondary | ICD-10-CM | POA: Diagnosis not present

## 2017-08-18 DIAGNOSIS — G4733 Obstructive sleep apnea (adult) (pediatric): Secondary | ICD-10-CM | POA: Diagnosis not present

## 2017-08-22 DIAGNOSIS — J45909 Unspecified asthma, uncomplicated: Secondary | ICD-10-CM | POA: Diagnosis not present

## 2017-08-22 DIAGNOSIS — I10 Essential (primary) hypertension: Secondary | ICD-10-CM | POA: Diagnosis not present

## 2017-08-22 DIAGNOSIS — I471 Supraventricular tachycardia: Secondary | ICD-10-CM | POA: Diagnosis not present

## 2017-08-22 DIAGNOSIS — M5136 Other intervertebral disc degeneration, lumbar region: Secondary | ICD-10-CM | POA: Diagnosis not present

## 2017-08-22 DIAGNOSIS — E43 Unspecified severe protein-calorie malnutrition: Secondary | ICD-10-CM | POA: Diagnosis not present

## 2017-08-22 DIAGNOSIS — G20 Parkinson's disease: Secondary | ICD-10-CM | POA: Diagnosis not present

## 2017-08-22 DIAGNOSIS — N390 Urinary tract infection, site not specified: Secondary | ICD-10-CM | POA: Diagnosis not present

## 2017-08-22 DIAGNOSIS — G4733 Obstructive sleep apnea (adult) (pediatric): Secondary | ICD-10-CM | POA: Diagnosis not present

## 2017-08-22 DIAGNOSIS — M172 Bilateral post-traumatic osteoarthritis of knee: Secondary | ICD-10-CM | POA: Diagnosis not present

## 2017-08-22 DIAGNOSIS — E785 Hyperlipidemia, unspecified: Secondary | ICD-10-CM | POA: Diagnosis not present

## 2017-08-23 ENCOUNTER — Telehealth (INDEPENDENT_AMBULATORY_CARE_PROVIDER_SITE_OTHER): Payer: Self-pay

## 2017-08-23 DIAGNOSIS — J45909 Unspecified asthma, uncomplicated: Secondary | ICD-10-CM | POA: Diagnosis not present

## 2017-08-23 DIAGNOSIS — I471 Supraventricular tachycardia: Secondary | ICD-10-CM | POA: Diagnosis not present

## 2017-08-23 DIAGNOSIS — I10 Essential (primary) hypertension: Secondary | ICD-10-CM | POA: Diagnosis not present

## 2017-08-23 DIAGNOSIS — E785 Hyperlipidemia, unspecified: Secondary | ICD-10-CM | POA: Diagnosis not present

## 2017-08-23 DIAGNOSIS — M5136 Other intervertebral disc degeneration, lumbar region: Secondary | ICD-10-CM | POA: Diagnosis not present

## 2017-08-23 DIAGNOSIS — M172 Bilateral post-traumatic osteoarthritis of knee: Secondary | ICD-10-CM | POA: Diagnosis not present

## 2017-08-23 DIAGNOSIS — G20 Parkinson's disease: Secondary | ICD-10-CM | POA: Diagnosis not present

## 2017-08-23 DIAGNOSIS — G4733 Obstructive sleep apnea (adult) (pediatric): Secondary | ICD-10-CM | POA: Diagnosis not present

## 2017-08-23 DIAGNOSIS — N390 Urinary tract infection, site not specified: Secondary | ICD-10-CM | POA: Diagnosis not present

## 2017-08-23 DIAGNOSIS — E43 Unspecified severe protein-calorie malnutrition: Secondary | ICD-10-CM | POA: Diagnosis not present

## 2017-08-23 NOTE — Telephone Encounter (Signed)
Seth Bake from [place] [phone]) called giving patient inr 1.1 and the patient is taking 31m daily coumadin.JD advise for the the patient to be recheck in 1 week and start taking 549mdaily

## 2017-08-25 ENCOUNTER — Telehealth: Payer: Self-pay | Admitting: Gastroenterology

## 2017-08-25 DIAGNOSIS — J45909 Unspecified asthma, uncomplicated: Secondary | ICD-10-CM | POA: Diagnosis not present

## 2017-08-25 DIAGNOSIS — M172 Bilateral post-traumatic osteoarthritis of knee: Secondary | ICD-10-CM | POA: Diagnosis not present

## 2017-08-25 DIAGNOSIS — I471 Supraventricular tachycardia: Secondary | ICD-10-CM | POA: Diagnosis not present

## 2017-08-25 DIAGNOSIS — E43 Unspecified severe protein-calorie malnutrition: Secondary | ICD-10-CM | POA: Diagnosis not present

## 2017-08-25 DIAGNOSIS — M5136 Other intervertebral disc degeneration, lumbar region: Secondary | ICD-10-CM | POA: Diagnosis not present

## 2017-08-25 DIAGNOSIS — G4733 Obstructive sleep apnea (adult) (pediatric): Secondary | ICD-10-CM | POA: Diagnosis not present

## 2017-08-25 DIAGNOSIS — E785 Hyperlipidemia, unspecified: Secondary | ICD-10-CM | POA: Diagnosis not present

## 2017-08-25 DIAGNOSIS — I10 Essential (primary) hypertension: Secondary | ICD-10-CM | POA: Diagnosis not present

## 2017-08-25 DIAGNOSIS — N390 Urinary tract infection, site not specified: Secondary | ICD-10-CM | POA: Diagnosis not present

## 2017-08-25 DIAGNOSIS — G20 Parkinson's disease: Secondary | ICD-10-CM | POA: Diagnosis not present

## 2017-08-25 NOTE — Telephone Encounter (Signed)
Pt left vm to speak  To nurse she has an apt with Dr. Lucky Cowboy July 10th or 13th she doesn't remember, she states she is taking rx voverine rx lydakane rx nitricine should she continue to take these twice a day or go down to once a day please call pt  Or leave vm if she doesn't answer

## 2017-08-29 DIAGNOSIS — G20 Parkinson's disease: Secondary | ICD-10-CM | POA: Diagnosis not present

## 2017-08-29 DIAGNOSIS — G4733 Obstructive sleep apnea (adult) (pediatric): Secondary | ICD-10-CM | POA: Diagnosis not present

## 2017-08-29 DIAGNOSIS — J45909 Unspecified asthma, uncomplicated: Secondary | ICD-10-CM | POA: Diagnosis not present

## 2017-08-29 DIAGNOSIS — N390 Urinary tract infection, site not specified: Secondary | ICD-10-CM | POA: Diagnosis not present

## 2017-08-29 DIAGNOSIS — E43 Unspecified severe protein-calorie malnutrition: Secondary | ICD-10-CM | POA: Diagnosis not present

## 2017-08-29 DIAGNOSIS — E785 Hyperlipidemia, unspecified: Secondary | ICD-10-CM | POA: Diagnosis not present

## 2017-08-29 DIAGNOSIS — M5136 Other intervertebral disc degeneration, lumbar region: Secondary | ICD-10-CM | POA: Diagnosis not present

## 2017-08-29 DIAGNOSIS — I10 Essential (primary) hypertension: Secondary | ICD-10-CM | POA: Diagnosis not present

## 2017-08-29 DIAGNOSIS — I471 Supraventricular tachycardia: Secondary | ICD-10-CM | POA: Diagnosis not present

## 2017-08-29 DIAGNOSIS — M172 Bilateral post-traumatic osteoarthritis of knee: Secondary | ICD-10-CM | POA: Diagnosis not present

## 2017-08-30 ENCOUNTER — Telehealth (INDEPENDENT_AMBULATORY_CARE_PROVIDER_SITE_OTHER): Payer: Self-pay

## 2017-08-30 DIAGNOSIS — E785 Hyperlipidemia, unspecified: Secondary | ICD-10-CM | POA: Diagnosis not present

## 2017-08-30 DIAGNOSIS — I10 Essential (primary) hypertension: Secondary | ICD-10-CM | POA: Diagnosis not present

## 2017-08-30 DIAGNOSIS — G20 Parkinson's disease: Secondary | ICD-10-CM | POA: Diagnosis not present

## 2017-08-30 DIAGNOSIS — M172 Bilateral post-traumatic osteoarthritis of knee: Secondary | ICD-10-CM | POA: Diagnosis not present

## 2017-08-30 DIAGNOSIS — N390 Urinary tract infection, site not specified: Secondary | ICD-10-CM | POA: Diagnosis not present

## 2017-08-30 DIAGNOSIS — I471 Supraventricular tachycardia: Secondary | ICD-10-CM | POA: Diagnosis not present

## 2017-08-30 DIAGNOSIS — E43 Unspecified severe protein-calorie malnutrition: Secondary | ICD-10-CM | POA: Diagnosis not present

## 2017-08-30 DIAGNOSIS — G4733 Obstructive sleep apnea (adult) (pediatric): Secondary | ICD-10-CM | POA: Diagnosis not present

## 2017-08-30 DIAGNOSIS — M5136 Other intervertebral disc degeneration, lumbar region: Secondary | ICD-10-CM | POA: Diagnosis not present

## 2017-08-30 DIAGNOSIS — J45909 Unspecified asthma, uncomplicated: Secondary | ICD-10-CM | POA: Diagnosis not present

## 2017-08-30 NOTE — Telephone Encounter (Signed)
Nurse called and stated that last week the patient INR was 1.4, and that you increased her coumadin to 5 mg from 4 mg.  Today her INR was 1.1. Would you like for the patient to stay on the 5 mg, or do you want to change the dosage?

## 2017-08-31 ENCOUNTER — Ambulatory Visit (INDEPENDENT_AMBULATORY_CARE_PROVIDER_SITE_OTHER): Payer: Medicare HMO

## 2017-08-31 DIAGNOSIS — D519 Vitamin B12 deficiency anemia, unspecified: Secondary | ICD-10-CM

## 2017-08-31 DIAGNOSIS — R29898 Other symptoms and signs involving the musculoskeletal system: Secondary | ICD-10-CM

## 2017-08-31 MED ORDER — CYANOCOBALAMIN 1000 MCG/ML IJ SOLN
1000.0000 ug | Freq: Once | INTRAMUSCULAR | Status: AC
  Administered 2017-08-31: 1000 ug via INTRAMUSCULAR

## 2017-08-31 NOTE — Telephone Encounter (Signed)
Called the script in to patient's pharmacy in [place], for the 7.5 mg daily. Dr. Lucky Cowboy would like for the patient to have her levels checked again in one week. I called nurse to notify her of the increase in the medication, and to recheck in one week.

## 2017-09-01 DIAGNOSIS — N390 Urinary tract infection, site not specified: Secondary | ICD-10-CM | POA: Diagnosis not present

## 2017-09-01 DIAGNOSIS — I10 Essential (primary) hypertension: Secondary | ICD-10-CM | POA: Diagnosis not present

## 2017-09-01 DIAGNOSIS — M5136 Other intervertebral disc degeneration, lumbar region: Secondary | ICD-10-CM | POA: Diagnosis not present

## 2017-09-01 DIAGNOSIS — G4733 Obstructive sleep apnea (adult) (pediatric): Secondary | ICD-10-CM | POA: Diagnosis not present

## 2017-09-01 DIAGNOSIS — G20 Parkinson's disease: Secondary | ICD-10-CM | POA: Diagnosis not present

## 2017-09-01 DIAGNOSIS — J45909 Unspecified asthma, uncomplicated: Secondary | ICD-10-CM | POA: Diagnosis not present

## 2017-09-01 DIAGNOSIS — E43 Unspecified severe protein-calorie malnutrition: Secondary | ICD-10-CM | POA: Diagnosis not present

## 2017-09-01 DIAGNOSIS — E785 Hyperlipidemia, unspecified: Secondary | ICD-10-CM | POA: Diagnosis not present

## 2017-09-01 DIAGNOSIS — I471 Supraventricular tachycardia: Secondary | ICD-10-CM | POA: Diagnosis not present

## 2017-09-01 DIAGNOSIS — M172 Bilateral post-traumatic osteoarthritis of knee: Secondary | ICD-10-CM | POA: Diagnosis not present

## 2017-09-05 DIAGNOSIS — N390 Urinary tract infection, site not specified: Secondary | ICD-10-CM | POA: Diagnosis not present

## 2017-09-05 DIAGNOSIS — Z885 Allergy status to narcotic agent status: Secondary | ICD-10-CM | POA: Diagnosis not present

## 2017-09-05 DIAGNOSIS — Z882 Allergy status to sulfonamides status: Secondary | ICD-10-CM | POA: Diagnosis not present

## 2017-09-05 DIAGNOSIS — Z0000 Encounter for general adult medical examination without abnormal findings: Secondary | ICD-10-CM | POA: Diagnosis not present

## 2017-09-06 ENCOUNTER — Telehealth (INDEPENDENT_AMBULATORY_CARE_PROVIDER_SITE_OTHER): Payer: Self-pay

## 2017-09-06 DIAGNOSIS — G4733 Obstructive sleep apnea (adult) (pediatric): Secondary | ICD-10-CM | POA: Diagnosis not present

## 2017-09-06 DIAGNOSIS — E43 Unspecified severe protein-calorie malnutrition: Secondary | ICD-10-CM | POA: Diagnosis not present

## 2017-09-06 DIAGNOSIS — N390 Urinary tract infection, site not specified: Secondary | ICD-10-CM | POA: Diagnosis not present

## 2017-09-06 DIAGNOSIS — I471 Supraventricular tachycardia: Secondary | ICD-10-CM | POA: Diagnosis not present

## 2017-09-06 DIAGNOSIS — M172 Bilateral post-traumatic osteoarthritis of knee: Secondary | ICD-10-CM | POA: Diagnosis not present

## 2017-09-06 DIAGNOSIS — M5136 Other intervertebral disc degeneration, lumbar region: Secondary | ICD-10-CM | POA: Diagnosis not present

## 2017-09-06 DIAGNOSIS — I10 Essential (primary) hypertension: Secondary | ICD-10-CM | POA: Diagnosis not present

## 2017-09-06 DIAGNOSIS — E785 Hyperlipidemia, unspecified: Secondary | ICD-10-CM | POA: Diagnosis not present

## 2017-09-06 DIAGNOSIS — J45909 Unspecified asthma, uncomplicated: Secondary | ICD-10-CM | POA: Diagnosis not present

## 2017-09-06 DIAGNOSIS — G20 Parkinson's disease: Secondary | ICD-10-CM | POA: Diagnosis not present

## 2017-09-07 DIAGNOSIS — E43 Unspecified severe protein-calorie malnutrition: Secondary | ICD-10-CM | POA: Diagnosis not present

## 2017-09-07 DIAGNOSIS — E785 Hyperlipidemia, unspecified: Secondary | ICD-10-CM | POA: Diagnosis not present

## 2017-09-07 DIAGNOSIS — G4733 Obstructive sleep apnea (adult) (pediatric): Secondary | ICD-10-CM | POA: Diagnosis not present

## 2017-09-07 DIAGNOSIS — I10 Essential (primary) hypertension: Secondary | ICD-10-CM | POA: Diagnosis not present

## 2017-09-07 DIAGNOSIS — N390 Urinary tract infection, site not specified: Secondary | ICD-10-CM | POA: Diagnosis not present

## 2017-09-07 DIAGNOSIS — I471 Supraventricular tachycardia: Secondary | ICD-10-CM | POA: Diagnosis not present

## 2017-09-07 DIAGNOSIS — M5136 Other intervertebral disc degeneration, lumbar region: Secondary | ICD-10-CM | POA: Diagnosis not present

## 2017-09-07 DIAGNOSIS — M172 Bilateral post-traumatic osteoarthritis of knee: Secondary | ICD-10-CM | POA: Diagnosis not present

## 2017-09-07 DIAGNOSIS — G20 Parkinson's disease: Secondary | ICD-10-CM | POA: Diagnosis not present

## 2017-09-07 DIAGNOSIS — J45909 Unspecified asthma, uncomplicated: Secondary | ICD-10-CM | POA: Diagnosis not present

## 2017-09-07 NOTE — Telephone Encounter (Signed)
I inform the nurse and she will make sure the patient know what JD advise

## 2017-09-12 ENCOUNTER — Ambulatory Visit: Payer: Medicare HMO | Admitting: Neurology

## 2017-09-12 ENCOUNTER — Encounter: Payer: Self-pay | Admitting: Neurology

## 2017-09-12 VITALS — BP 148/98 | HR 102 | Ht 60.0 in | Wt 170.0 lb

## 2017-09-12 DIAGNOSIS — T371X5A Adverse effect of antimycobacterial drugs, initial encounter: Secondary | ICD-10-CM

## 2017-09-12 DIAGNOSIS — G620 Drug-induced polyneuropathy: Secondary | ICD-10-CM | POA: Diagnosis not present

## 2017-09-12 NOTE — Progress Notes (Signed)
Follow-up Visit   Date: 09/12/17    Lindsay Stephenson MRN: [id number] DOB: 1946/01/03   Interim History: Lindsay Stephenson is a 72 y.o. 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  The patient was accompanied to the clinic by husband who also provides collateral information.    History of present illness: She was started on dapsone in 2016 for linear IgA bullous dermatosa.  She moved from [place] to [facility] in May 2017.  Around this same time, she started having increased falls, developed progressive weakness of the legs, burning/stabbing pain of the legs, and falls.  She began using a cane in September 2017 and a rollator walker in March 2018.  Since using a walker, she has only fallen once.  She has constant severe pain of the lower legs, described as burning, stabbing, knife-like.  Because of the severity of her weakness, she is unable to raise the legs which was initially mistaken for Parkinson's gait.  She saw Dr. Carles Collet (movement disorder specialist) who stopped sinemet and did not see that she has Parkinson's. For her hip pain, she saw Dr. French Ana (ortho) who noted very mild degenerative changes of the hips and did not see that her gait difficulty with stemming from a primary orthopeadic problem.  Most recently, she saw Dr. Vertell Limber (neurosurgeon) for multilevel cervical canal stenosis and he recommended to follow symptoms clinically and continue further neurological work-up for neuropathy.  Of note, she reports having tingling sensation of the lateral thigh for 10 years.  She admits to always being overweight.  UPDATE 03/07/2017:  Within a month of stopping dapsone, she was able to start moving her toes.  She was started on methotrexate and prednisone for her dermatosa.  She is working very hard with water and land therapy, but has noticed a some improvement in her leg strength such that she is able to raise the leg  higher.  No no new weakness.  She still has right foot drop.  She is using a rollator, which has helped her balance and mobility.  She has only had one fall in the past 6 months. She has many questions regarding her prognosis and management options.  UPDATE 09/12/2017:  She is here for follow-up visit.  In March, she was hospitalized for c.difficile colitis where she was nonambulatory for 9 days and became significantly much weaker due to deconditioning.  She has been back home since March, she has been making slow progress with her leg strength with physical therapy and will be starting out-patient PT.  She is able to walk with a walker and rollator, but relies on wheelchair for long distances.    Medications:  Current Outpatient Medications on File Prior to Visit  Medication Sig Dispense Refill  . acetaminophen (TYLENOL) 325 MG tablet Take 650 mg by mouth.    Marland Kitchen albuterol (PROVENTIL HFA;VENTOLIN HFA) 108 (90 Base) MCG/ACT inhaler Inhale 1-2 puffs into the lungs every 6 (six) hours as needed for wheezing or shortness of breath. 1 Inhaler 1  . AMBULATORY NON FORMULARY MEDICATION 1 Units by Other route daily. Walk in bathtub/shower 1 Units 0  . Calcium Carb-Ergocalciferol 250-125 MG-UNIT TABS Take by mouth.    . folic acid (FOLVITE) 1 MG tablet Take 1 mg by mouth daily.     . hydrocortisone (ANUSOL-HC) 2.5 % rectal cream Place 1 application rectally 2 (two) times daily. 30 g 1  . hydrocortisone (ANUSOL-HC) 25 MG  suppository Place 1 suppository (25 mg total) rectally at bedtime. 24 suppository 1  . Hydrocortisone (GERHARDT'S BUTT CREAM) CREA Apply 1 application topically daily. 1 each 0  . levocetirizine (XYZAL) 5 MG tablet Take 1 tablet (5 mg total) by mouth as needed. 90 tablet 1  . methotrexate 2.5 MG tablet Take 10 mg by mouth once a week. Takes on Fridays    . Multiple Vitamin (MULTIVITAMIN) capsule Take 1 capsule by mouth daily.    Marland Kitchen omeprazole (PRILOSEC) 20 MG capsule Take 1 capsule (20 mg  total) by mouth daily. 90 capsule 3  . potassium chloride SA (K-DUR,KLOR-CON) 20 MEQ tablet Take 1 tablet (20 mEq total) by mouth 2 (two) times daily. 60 tablet 1  . predniSONE (DELTASONE) 5 MG tablet Take 7.5 mg by mouth.     . Probiotic Product (TRUNATURE PROBIOTIC FOR KIDS) CHEW Chew 1 each by mouth daily.    Marland Kitchen warfarin (COUMADIN) 4 MG tablet Take 1 tablet (4 mg total) by mouth daily. 30 tablet 0   No current facility-administered medications on file prior to visit.     Allergies:  Allergies  Allergen Reactions  . Entex Lq [Phenylephrine-Guaifenesin] Swelling    TONGUE  . Phenylephrine-Guaifenesin Swelling  . Sulfa Antibiotics Itching and Hives  . Amoxicillin-Pot Clavulanate     Other reaction(s): Other (See Comments) Loss of appetite, weakness  . Codeine Hives and Other (See Comments)    Pt reports only having symptoms after receiving large amounts of medication  . Tape Other (See Comments)    CAN PULL OFF LAYER OF SKIN    Review of Systems:  CONSTITUTIONAL: No fevers, chills, night sweats, or weight loss.  EYES: No visual changes or eye pain ENT: No hearing changes.  No history of nose bleeds.   RESPIRATORY: No cough, wheezing and shortness of breath.   CARDIOVASCULAR: Negative for chest pain, and palpitations.   GI: Negative for abdominal discomfort, blood in stools or black stools.  No recent change in bowel habits.   GU:  No history of incontinence.   MUSCLOSKELETAL: No history of joint pain or swelling.  No myalgias.   SKIN: Negative for lesions, rash, and itching.   ENDOCRINE: Negative for cold or heat intolerance, polydipsia or goiter.   PSYCH:  No depression + anxiety symptoms.   NEURO: As Above.   Vital Signs:  BP (!) 148/98   Pulse (!) 102   Ht 5' (1.524 m)   Wt 170 lb (77.1 kg)   SpO2 97%   BMI 33.20 kg/m   Neurological Exam: MENTAL STATUS including orientation to time, place, person, recent and remote memory, attention span and concentration,  language, and fund of knowledge is normal.  Speech is not dysarthric.  CRANIAL NERVES:  Pupils equal round and reactive to light.  Normal conjugate, extra-ocular eye movements in all directions of gaze.  No ptosis  Face is symmetric. Palate elevates symmetrically.  Tongue is midline.  MOTOR:  R TA atrophy, no fasciculations or abnormal movements.  No pronator drift.  Tone is normal.    Right Upper Extremity:    Left Upper Extremity:    Deltoid  5/5   Deltoid  5/5   Biceps  5/5   Biceps  5/5   Triceps  5/5   Triceps  5/5   Wrist extensors  5/5   Wrist extensors  5/5   Wrist flexors  5/5   Wrist flexors  5/5   Finger extensors  5/5   Finger  extensors  5/5   Finger flexors  5/5   Finger flexors  5/5   Dorsal interossei  4/5   Dorsal interossei  4/5   Abductor pollicis  5/5   Abductor pollicis  5/5   Tone (Ashworth scale)  0  Tone (Ashworth scale)  0   Right Lower Extremity:    Left Lower Extremity:    Hip flexors  4/5   Hip flexors  5-/5   Hip extensors  5/5   Hip extensors  5/5   Knee flexors  5-/5   Knee flexors  5-/5   Knee extensors  5/5   Knee extensors  5/5   Dorsiflexors  4/5   Dorsiflexors  4/5   Plantarflexors  4/5   Plantarflexors  4/5   Toe extensors  3/5   Toe extensors  4/5   Toe flexors  3/5   Toe flexors  4/5   Tone (Ashworth scale)  0  Tone (Ashworth scale)  0   MSRs:  Reflexes are 2+/4 throughout, except 3+/4 bilateral patella.  SENSORY:  Intact to vibration throughout.  COORDINATION/GAIT:  Gait is not tested as she is in a wheelchair today.  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    A multilevel intraspinal canal lesion (i.e. radiculopathy, disorder of anterior horn cells, etc.) is thought to be less likely. Clinical correlation recommended.   MRI cervical spine wo contrast 07/17/2016: Cervical spondylosis and degenerative disc disease,  causing moderate impingement at C4-5, C5-6, and C6-7 ; and mild impingement at C2-3, as detailed above.  MRI lumbar spine wo contrast 03/16/2016: 1. At L3-4 there is a broad-based disc osteophyte complex. Mild bilateral facet arthropathy. Bilateral lateral recess stenosis. Bilateral mild foraminal narrowing. Mild central canal narrowing. 2. At L4-5 there is a broad-based disc bulge with a small central disc protrusion. Bilateral facet arthropathy. Bilateral lateral recess narrowing. Mild bilateral foraminal stenosis. Mild bilateral facet arthropathy.   Labs 08/18/2015:  SPEP no M protein  IMPRESSION/PLAN: Dapsone-induced motor neuropathy affecting the legs. She had a setback due to her recent hospitalization and long recovery with c.difficile colitis,and is slowly getting back to her baseline strength prior to her illness. I encouraged her to continue home exercises and start out-patient physical therapy.  Encouraged to use AFO when walking.  Reassurance provided.   Return to clinic in 6 months   Thank you for allowing me to participate in patient's care.  If I can answer any additional questions, I would be pleased to do so.    Sincerely,    Tycen Dockter K. Posey Pronto, DO

## 2017-09-12 NOTE — Patient Instructions (Signed)
Return to clinic in 6 months.

## 2017-09-13 DIAGNOSIS — N390 Urinary tract infection, site not specified: Secondary | ICD-10-CM | POA: Diagnosis not present

## 2017-09-15 ENCOUNTER — Other Ambulatory Visit (INDEPENDENT_AMBULATORY_CARE_PROVIDER_SITE_OTHER): Payer: Self-pay

## 2017-09-15 ENCOUNTER — Other Ambulatory Visit
Admission: RE | Admit: 2017-09-15 | Discharge: 2017-09-15 | Disposition: A | Discharge status: Home / Self Care | Payer: Medicare HMO | Source: Ambulatory Visit | Attending: Vascular Surgery | Admitting: Vascular Surgery

## 2017-09-15 DIAGNOSIS — I82401 Acute embolism and thrombosis of unspecified deep veins of right lower extremity: Secondary | ICD-10-CM

## 2017-09-15 LAB — PROTIME-INR
INR: 2.1
Prothrombin Time: 23.4 seconds — ABNORMAL HIGH (ref 11.4–15.2)

## 2017-09-19 DIAGNOSIS — M5136 Other intervertebral disc degeneration, lumbar region: Secondary | ICD-10-CM | POA: Diagnosis not present

## 2017-09-21 ENCOUNTER — Telehealth (INDEPENDENT_AMBULATORY_CARE_PROVIDER_SITE_OTHER): Payer: Self-pay

## 2017-09-21 ENCOUNTER — Other Ambulatory Visit (INDEPENDENT_AMBULATORY_CARE_PROVIDER_SITE_OTHER): Payer: Self-pay | Admitting: Vascular Surgery

## 2017-09-21 DIAGNOSIS — R531 Weakness: Secondary | ICD-10-CM | POA: Diagnosis not present

## 2017-09-21 DIAGNOSIS — R262 Difficulty in walking, not elsewhere classified: Secondary | ICD-10-CM | POA: Diagnosis not present

## 2017-09-21 DIAGNOSIS — I82401 Acute embolism and thrombosis of unspecified deep veins of right lower extremity: Secondary | ICD-10-CM

## 2017-09-21 NOTE — Progress Notes (Signed)
INR

## 2017-09-21 NOTE — Telephone Encounter (Signed)
Patient inr 2.1 and is taking 7.36m.I spoke with KMaudie Mercuryand she advise for the patient to be recheck in 1 week with no changes with medication and I did spoke with the patient with the medical information.

## 2017-09-23 ENCOUNTER — Other Ambulatory Visit
Admission: RE | Admit: 2017-09-23 | Discharge: 2017-09-23 | Disposition: A | Discharge status: Home / Self Care | Payer: Medicare HMO | Source: Ambulatory Visit | Attending: Vascular Surgery | Admitting: Vascular Surgery

## 2017-09-23 ENCOUNTER — Other Ambulatory Visit (INDEPENDENT_AMBULATORY_CARE_PROVIDER_SITE_OTHER): Payer: Self-pay | Admitting: Vascular Surgery

## 2017-09-23 DIAGNOSIS — I82401 Acute embolism and thrombosis of unspecified deep veins of right lower extremity: Secondary | ICD-10-CM

## 2017-09-23 LAB — PROTIME-INR
INR: 1.7
Prothrombin Time: 19.8 seconds — ABNORMAL HIGH (ref 11.4–15.2)

## 2017-09-27 ENCOUNTER — Telehealth (INDEPENDENT_AMBULATORY_CARE_PROVIDER_SITE_OTHER): Payer: Self-pay

## 2017-09-27 NOTE — Telephone Encounter (Signed)
Patient called and stated that she had her INR checked last Friday. I checked the results, her level is a 1.7.  The patient wants to know if she needs to make any adjustments to her Coumadin?

## 2017-09-27 NOTE — Telephone Encounter (Signed)
I would touch base with April - my instructions were to have the patient increase her coumadin and have her INR re-drawn in a week.

## 2017-09-28 ENCOUNTER — Other Ambulatory Visit (INDEPENDENT_AMBULATORY_CARE_PROVIDER_SITE_OTHER): Payer: Self-pay | Admitting: Vascular Surgery

## 2017-09-28 DIAGNOSIS — I82401 Acute embolism and thrombosis of unspecified deep veins of right lower extremity: Secondary | ICD-10-CM

## 2017-09-28 DIAGNOSIS — R262 Difficulty in walking, not elsewhere classified: Secondary | ICD-10-CM | POA: Diagnosis not present

## 2017-09-28 DIAGNOSIS — R531 Weakness: Secondary | ICD-10-CM | POA: Diagnosis not present

## 2017-09-28 NOTE — Telephone Encounter (Signed)
The patient should increase her Coumadin dosing from 7.44m to 9.568mdaily.  I will order an INR in one week.

## 2017-09-28 NOTE — Telephone Encounter (Signed)
Called the patient back to let her know that Maudie Mercury is increasing her medication to 9.5 mg daily, and that she needs to go to have her INR rechecked in about one week and that the order has already been put in.

## 2017-09-28 NOTE — Telephone Encounter (Signed)
Lindsay Stephenson is making adjustments and ordering another INR check.

## 2017-09-30 DIAGNOSIS — R262 Difficulty in walking, not elsewhere classified: Secondary | ICD-10-CM | POA: Diagnosis not present

## 2017-09-30 DIAGNOSIS — R531 Weakness: Secondary | ICD-10-CM | POA: Diagnosis not present

## 2017-10-04 DIAGNOSIS — R531 Weakness: Secondary | ICD-10-CM | POA: Diagnosis not present

## 2017-10-04 DIAGNOSIS — R262 Difficulty in walking, not elsewhere classified: Secondary | ICD-10-CM | POA: Diagnosis not present

## 2017-10-05 DIAGNOSIS — R262 Difficulty in walking, not elsewhere classified: Secondary | ICD-10-CM | POA: Diagnosis not present

## 2017-10-05 DIAGNOSIS — R531 Weakness: Secondary | ICD-10-CM | POA: Diagnosis not present

## 2017-10-06 ENCOUNTER — Other Ambulatory Visit
Admission: RE | Admit: 2017-10-06 | Discharge: 2017-10-06 | Disposition: A | Discharge status: Home / Self Care | Payer: Medicare HMO | Source: Ambulatory Visit | Attending: Vascular Surgery | Admitting: Vascular Surgery

## 2017-10-06 DIAGNOSIS — I82401 Acute embolism and thrombosis of unspecified deep veins of right lower extremity: Secondary | ICD-10-CM | POA: Diagnosis not present

## 2017-10-06 LAB — PROTIME-INR
INR: 3.02
PROTHROMBIN TIME: 31.1 s — AB (ref 11.4–15.2)

## 2017-10-10 ENCOUNTER — Other Ambulatory Visit (INDEPENDENT_AMBULATORY_CARE_PROVIDER_SITE_OTHER): Payer: Self-pay | Admitting: Vascular Surgery

## 2017-10-10 DIAGNOSIS — I82401 Acute embolism and thrombosis of unspecified deep veins of right lower extremity: Secondary | ICD-10-CM

## 2017-10-11 DIAGNOSIS — R531 Weakness: Secondary | ICD-10-CM | POA: Diagnosis not present

## 2017-10-11 DIAGNOSIS — R262 Difficulty in walking, not elsewhere classified: Secondary | ICD-10-CM | POA: Diagnosis not present

## 2017-10-13 DIAGNOSIS — R531 Weakness: Secondary | ICD-10-CM | POA: Diagnosis not present

## 2017-10-13 DIAGNOSIS — R262 Difficulty in walking, not elsewhere classified: Secondary | ICD-10-CM | POA: Diagnosis not present

## 2017-10-14 ENCOUNTER — Ambulatory Visit: Payer: Medicare HMO

## 2017-10-15 ENCOUNTER — Other Ambulatory Visit
Admission: RE | Admit: 2017-10-15 | Discharge: 2017-10-15 | Disposition: A | Discharge status: Home / Self Care | Payer: Medicare HMO | Source: Ambulatory Visit | Attending: Vascular Surgery | Admitting: Vascular Surgery

## 2017-10-15 DIAGNOSIS — I82401 Acute embolism and thrombosis of unspecified deep veins of right lower extremity: Secondary | ICD-10-CM | POA: Diagnosis not present

## 2017-10-15 LAB — PROTIME-INR
INR: 3.04
PROTHROMBIN TIME: 31.2 s — AB (ref 11.4–15.2)

## 2017-10-17 ENCOUNTER — Other Ambulatory Visit (INDEPENDENT_AMBULATORY_CARE_PROVIDER_SITE_OTHER): Payer: Self-pay | Admitting: Vascular Surgery

## 2017-10-17 ENCOUNTER — Ambulatory Visit (INDEPENDENT_AMBULATORY_CARE_PROVIDER_SITE_OTHER): Payer: Medicare HMO

## 2017-10-17 DIAGNOSIS — I82401 Acute embolism and thrombosis of unspecified deep veins of right lower extremity: Secondary | ICD-10-CM

## 2017-10-17 DIAGNOSIS — R262 Difficulty in walking, not elsewhere classified: Secondary | ICD-10-CM | POA: Diagnosis not present

## 2017-10-17 DIAGNOSIS — R531 Weakness: Secondary | ICD-10-CM | POA: Diagnosis not present

## 2017-10-17 DIAGNOSIS — D519 Vitamin B12 deficiency anemia, unspecified: Secondary | ICD-10-CM

## 2017-10-17 MED ORDER — CYANOCOBALAMIN 1000 MCG/ML IJ SOLN
1000.0000 ug | Freq: Once | INTRAMUSCULAR | Status: AC
Start: 2017-10-17 — End: 2017-10-17
  Administered 2017-10-17: 1000 ug via INTRAMUSCULAR

## 2017-10-18 ENCOUNTER — Encounter (INDEPENDENT_AMBULATORY_CARE_PROVIDER_SITE_OTHER): Payer: Self-pay | Admitting: Vascular Surgery

## 2017-10-18 ENCOUNTER — Ambulatory Visit (INDEPENDENT_AMBULATORY_CARE_PROVIDER_SITE_OTHER): Payer: Medicare HMO | Admitting: Vascular Surgery

## 2017-10-18 VITALS — BP 154/81 | HR 84 | Resp 14 | Ht 63.0 in | Wt 170.0 lb

## 2017-10-18 DIAGNOSIS — A0472 Enterocolitis due to Clostridium difficile, not specified as recurrent: Secondary | ICD-10-CM | POA: Diagnosis not present

## 2017-10-18 DIAGNOSIS — E785 Hyperlipidemia, unspecified: Secondary | ICD-10-CM

## 2017-10-18 DIAGNOSIS — I82401 Acute embolism and thrombosis of unspecified deep veins of right lower extremity: Secondary | ICD-10-CM

## 2017-10-18 NOTE — Assessment & Plan Note (Signed)
The patient is about 4 months into treatment for right lower extremity DVT.  She continues to improve clinically.  We will do a full 6 months of treatment which will hopefully correlate to when she is out of her wheelchair and resumed normal activity as well.  She can wear compression stockings and elevate her legs as needed for swelling or pain.  I will see her back in 2 to 3 months

## 2017-10-18 NOTE — Progress Notes (Signed)
MRN : [id number]  Lindsay Stephenson is a 72 y.o. (03-07-1946) female who presents with chief complaint of  Chief Complaint  Patient presents with  . Follow-up    2 month follow up, no [place]  .  History of Present Illness: Patient returns today in follow up of DVT.  She continues to improve and her mobility is much better.  She is still in a wheelchair today, but she is walking a lot more and plans to be out of the wheelchair by the end of August.  Her anticoagulation has been reasonably stable although she has 2 upcoming procedure she will have to hold it for a couple of days 4.  No chest pain or shortness of breath.  No fevers or chills.  Current Outpatient Medications  Medication Sig Dispense Refill  . acetaminophen (TYLENOL) 325 MG tablet Take 650 mg by mouth.    Marland Kitchen albuterol (PROVENTIL HFA;VENTOLIN HFA) 108 (90 Base) MCG/ACT inhaler Inhale 1-2 puffs into the lungs every 6 (six) hours as needed for wheezing or shortness of breath. 1 Inhaler 1  . AMBULATORY NON FORMULARY MEDICATION 1 Units by Other route daily. Walk in bathtub/shower 1 Units 0  . Calcium Carb-Ergocalciferol 250-125 MG-UNIT TABS Take by mouth.    . estradiol (ESTRACE VAGINAL) 0.1 MG/GM vaginal cream Place vaginally.    . folic acid (FOLVITE) 1 MG tablet Take 1 mg by mouth daily.     . hydrocortisone (ANUSOL-HC) 2.5 % rectal cream Place 1 application rectally 2 (two) times daily. 30 g 1  . hydrocortisone (ANUSOL-HC) 25 MG suppository Place 1 suppository (25 mg total) rectally at bedtime. 24 suppository 1  . Hydrocortisone (GERHARDT'S BUTT CREAM) CREA Apply 1 application topically daily. 1 each 0  . levocetirizine (XYZAL) 5 MG tablet Take 1 tablet (5 mg total) by mouth as needed. 90 tablet 1  . methotrexate 2.5 MG tablet Take 10 mg by mouth once a week. Takes on Fridays    . Multiple Vitamin (MULTIVITAMIN) capsule Take 1 capsule by mouth daily.    Marland Kitchen omeprazole (PRILOSEC) 20 MG capsule Take 1 capsule (20 mg total) by mouth daily.  90 capsule 3  . potassium chloride SA (K-DUR,KLOR-CON) 20 MEQ tablet Take 1 tablet (20 mEq total) by mouth 2 (two) times daily. 60 tablet 1  . predniSONE (DELTASONE) 5 MG tablet Take 7.5 mg by mouth.     . Probiotic Product (TRUNATURE PROBIOTIC FOR KIDS) CHEW Chew 1 each by mouth daily.    Marland Kitchen warfarin (COUMADIN) 4 MG tablet Take 1 tablet (4 mg total) by mouth daily. 30 tablet 0   No current facility-administered medications for this visit.     Past Medical History:  Diagnosis Date  . Allergy   . Anemia    TAKING IRON  . Arthritis    KNEES AND HANDS  . Asthma    COUGHING, NO ATTACKS IN A YEAR OR 2  . Dyspnea    ON EXERTION  . GERD (gastroesophageal reflux disease)   . HOH (hard of hearing)    30-35 % HEARING LOSS  . Hyperlipidemia   . Hypertension    HX OF  . LABD (linear immunoglobulin A bullous dermatosis)   . Mitral valve prolapse   . Mitral valve prolapse   . 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 Parkinson's disease ([place])   . Rectal bleeding   . Sleep apnea    CPAP  . Vertigo  Past Surgical History:  Procedure Laterality Date  . ABDOMINAL HYSTERECTOMY    . APPENDECTOMY    . COLONOSCOPY WITH PROPOFOL N/A 01/16/2016   Procedure: COLONOSCOPY WITH PROPOFOL;  Surgeon: Lucilla Lame, MD;  Location: [facility];  Service: Endoscopy;  Laterality: N/A;  CPAP  . KNEE SURGERY     2013 and 2014  . POLYPECTOMY  01/16/2016   Procedure: POLYPECTOMY;  Surgeon: Lucilla Lame, MD;  Location: [facility];  Service: Endoscopy;;  . REPLACEMENT TOTAL KNEE BILATERAL Bilateral    2013, 2014  . TONSILLECTOMY     ADENOIDECTOMY    Family History  Problem Relation Age of Onset  . Osteoporosis Mother   . Chronic Renal Failure Father   . Arthritis/Rheumatoid Sister   . Stroke Sister   . Fibromyalgia Sister   . Breast cancer Sister 53  . Diabetes Brother     Social History Social History        Tobacco Use  .  Smoking status: Former Smoker    Years: 3.00    Types: Cigarettes    Last attempt to quit: 1969    Years since quitting: 50.2  . Smokeless tobacco: Never Used  Substance Use Topics  . Alcohol use: No    Comment: 4-5 times a year  . Drug use: No         Allergies  Allergen Reactions  . Entex Lq [Phenylephrine-Guaifenesin] Swelling    TONGUE  . Phenylephrine-Guaifenesin Swelling  . Amoxicillin-Pot Clavulanate     Other reaction(s): Other (See Comments) Loss of appetite, weakness  . Codeine Hives and Other (See Comments)    Pt reports only having symptoms after receiving large amounts of medication  . Sulfa Antibiotics Itching  . Tape Other (See Comments)    CAN PULL OFF LAYER OF SKIN      REVIEW OF SYSTEMS(Negative unless checked)  Constitutional: [x] Weight loss[] Fever[] Chills Cardiac:[] Chest pain[] Chest pressure[] Palpitations [] Shortness of breath when laying flat [] Shortness of breath at rest [] Shortness of breath with exertion. Vascular: [] Pain in legs with walking[] Pain in legsat rest[] Pain in legs when laying flat [] Claudication [] Pain in feet when walking [] Pain in feet at rest [] Pain in feet when laying flat [x] History of DVT [x] Phlebitis [] Swelling in legs [] Varicose veins [] Non-healing ulcers Pulmonary: [] Uses home oxygen [] Productive cough[] Hemoptysis [] Wheeze [] COPD [] Asthma Neurologic: [] Dizziness [] Blackouts [] Seizures [] History of stroke [] History of TIA[] Aphasia [] Temporary blindness[] Dysphagia [] Weaknessor numbness in arms [x] Weakness or numbnessin legs Musculoskeletal: [x] Arthritis [] Joint swelling [] Joint pain [] Low back pain Hematologic:[] Easy bruising[] Easy bleeding [] Hypercoagulable state [] Anemic [] Hepatitis Gastrointestinal:[x] Blood in stool[] Vomiting blood[] Gastroesophageal reflux/heartburn[x] Abdominal pain Genitourinary:  [] Chronic kidney disease [] Difficulturination [] Frequenturination [] Burning with urination[] Hematuria Skin: [] Rashes [] Ulcers [] Wounds Psychological: [] History of anxiety[] History of major depression.     Physical Examination  BP (!) 154/81 (BP Location: Right Arm, Patient Position: Sitting)   Pulse 84   Resp 14   Ht 5' 3"  (1.6 m)   Wt 170 lb (77.1 kg)   BMI 30.11 kg/m  Gen:  WD/WN, NAD Head: /AT, No temporalis wasting. Ear/Nose/Throat: Hearing grossly intact, nares w/o erythema or drainage Eyes: Conjunctiva clear. Sclera non-icteric Neck: Supple.  Trachea midline Pulmonary:  Good air movement, no use of accessory muscles.  Cardiac: RRR, no JVD Vascular:  Vessel Right Left  Radial Palpable Palpable                          PT  1+ palpable  1+ palpable  DP Palpable Palpable    Musculoskeletal: M/S 5/5 throughout.  No deformity or atrophy.  1+ right lower extremity edema, trace left lower extremity edema.  Using a wheelchair today Neurologic: Sensation grossly intact in extremities.  Symmetrical.  Speech is fluent.  Psychiatric: Judgment intact, Mood & affect appropriate for pt's clinical situation. Dermatologic: No rashes or ulcers noted.  No cellulitis or open wounds.       Labs Recent Results (from the past 2160 hour(s))  INR/PT     Status: None   Collection Time: 07/27/17  3:53 PM  Result Value Ref Range   INR 1.1 0.8 - 1.2    Comment: Reference interval is for non-anticoagulated patients. Suggested INR therapeutic range for Vitamin K antagonist therapy:    Standard Dose (moderate intensity                   therapeutic range):       2.0 - 3.0    Higher intensity therapeutic range       2.5 - 3.5    Prothrombin Time 11.8 9.1 - 12.0 sec  INR/PT     Status: Abnormal   Collection Time: 09/15/17 12:08 PM  Result Value Ref Range   Prothrombin Time 23.4 (H) 11.4 - 15.2 seconds   INR 2.10     Comment: Performed at [facility], [address] [place], [place] [address]  INR/PT     Status: Abnormal   Collection Time: 09/23/17 11:06 AM  Result Value Ref Range   Prothrombin Time 19.8 (H) 11.4 - 15.2 seconds   INR 1.70     Comment: Performed at [facility], [address] [place], [place] [address]  INR/PT     Status: Abnormal   Collection Time: 10/06/17 11:24 AM  Result Value Ref Range   Prothrombin Time 31.1 (H) 11.4 - 15.2 seconds   INR 3.02     Comment: Performed at [facility], [address] [place], [place] [address]  INR/PT     Status: Abnormal   Collection Time: 10/15/17  1:11 PM  Result Value Ref Range   Prothrombin Time 31.2 (H) 11.4 - 15.2 seconds   INR 3.04     Comment: Performed at [facility], [address], [place], [place] [address]    Radiology No results found.  Assessment/Plan Hypertension, essential, benign blood pressure control important in reducing the progression of atherosclerotic disease. On appropriate oral medications.   Clostridium difficile colitis Improved but debilitated her for some time.  Hyperlipidemia with target LDL less than 100 lipid control important in reducing the progression of atherosclerotic disease. Continue statin therapy   DVT (deep venous thrombosis) (HCC) The patient is about 4 months into treatment for right lower extremity DVT.  She continues to improve clinically.  We will do a full 6 months of treatment which will hopefully correlate to when she is out of her wheelchair and resumed normal activity as well.  She can wear compression stockings and elevate her legs as needed for swelling or pain.  I will see her back in 2 to 3 months    Leotis Pain, MD  10/18/2017 11:38 AM    This note was created with Dragon medical transcription system.  Any errors from dictation are purely unintentional

## 2017-10-20 DIAGNOSIS — K449 Diaphragmatic hernia without obstruction or gangrene: Secondary | ICD-10-CM | POA: Diagnosis not present

## 2017-10-20 DIAGNOSIS — N200 Calculus of kidney: Secondary | ICD-10-CM | POA: Diagnosis not present

## 2017-10-20 DIAGNOSIS — R319 Hematuria, unspecified: Secondary | ICD-10-CM | POA: Diagnosis not present

## 2017-10-25 DIAGNOSIS — G4733 Obstructive sleep apnea (adult) (pediatric): Secondary | ICD-10-CM | POA: Diagnosis not present

## 2017-11-04 ENCOUNTER — Other Ambulatory Visit (INDEPENDENT_AMBULATORY_CARE_PROVIDER_SITE_OTHER): Payer: Self-pay | Admitting: Nurse Practitioner

## 2017-11-04 ENCOUNTER — Other Ambulatory Visit
Admission: RE | Admit: 2017-11-04 | Discharge: 2017-11-04 | Disposition: A | Discharge status: Home / Self Care | Payer: Medicare HMO | Source: Ambulatory Visit | Attending: Vascular Surgery | Admitting: Vascular Surgery

## 2017-11-04 DIAGNOSIS — I82401 Acute embolism and thrombosis of unspecified deep veins of right lower extremity: Secondary | ICD-10-CM

## 2017-11-04 LAB — PROTIME-INR
INR: 2.03
PROTHROMBIN TIME: 22.8 s — AB (ref 11.4–15.2)

## 2017-11-07 DIAGNOSIS — R262 Difficulty in walking, not elsewhere classified: Secondary | ICD-10-CM | POA: Diagnosis not present

## 2017-11-07 DIAGNOSIS — R531 Weakness: Secondary | ICD-10-CM | POA: Diagnosis not present

## 2017-11-10 DIAGNOSIS — R262 Difficulty in walking, not elsewhere classified: Secondary | ICD-10-CM | POA: Diagnosis not present

## 2017-11-10 DIAGNOSIS — R531 Weakness: Secondary | ICD-10-CM | POA: Diagnosis not present

## 2017-11-12 ENCOUNTER — Other Ambulatory Visit
Admission: RE | Admit: 2017-11-12 | Discharge: 2017-11-12 | Disposition: A | Discharge status: Home / Self Care | Payer: Medicare HMO | Source: Ambulatory Visit | Attending: Nurse Practitioner | Admitting: Nurse Practitioner

## 2017-11-12 DIAGNOSIS — I82401 Acute embolism and thrombosis of unspecified deep veins of right lower extremity: Secondary | ICD-10-CM | POA: Diagnosis not present

## 2017-11-12 LAB — PROTIME-INR
INR: 2.94
Prothrombin Time: 30.4 seconds — ABNORMAL HIGH (ref 11.4–15.2)

## 2017-11-14 DIAGNOSIS — R262 Difficulty in walking, not elsewhere classified: Secondary | ICD-10-CM | POA: Diagnosis not present

## 2017-11-14 DIAGNOSIS — R531 Weakness: Secondary | ICD-10-CM | POA: Diagnosis not present

## 2017-11-17 ENCOUNTER — Other Ambulatory Visit (INDEPENDENT_AMBULATORY_CARE_PROVIDER_SITE_OTHER): Payer: Self-pay | Admitting: Nurse Practitioner

## 2017-11-17 ENCOUNTER — Other Ambulatory Visit (INDEPENDENT_AMBULATORY_CARE_PROVIDER_SITE_OTHER): Payer: Medicare HMO

## 2017-11-17 DIAGNOSIS — D519 Vitamin B12 deficiency anemia, unspecified: Secondary | ICD-10-CM

## 2017-11-17 DIAGNOSIS — I82401 Acute embolism and thrombosis of unspecified deep veins of right lower extremity: Secondary | ICD-10-CM

## 2017-11-17 MED ORDER — CYANOCOBALAMIN 1000 MCG/ML IJ SOLN
1000.0000 ug | Freq: Once | INTRAMUSCULAR | Status: AC
  Administered 2017-11-17: 1000 ug via INTRAMUSCULAR

## 2017-11-17 NOTE — Progress Notes (Signed)
Pt. INR 2.94, continue same dose, recheck 1 week following last check

## 2017-11-21 DIAGNOSIS — R262 Difficulty in walking, not elsewhere classified: Secondary | ICD-10-CM | POA: Diagnosis not present

## 2017-11-21 DIAGNOSIS — R531 Weakness: Secondary | ICD-10-CM | POA: Diagnosis not present

## 2017-11-22 ENCOUNTER — Other Ambulatory Visit
Admission: RE | Admit: 2017-11-22 | Discharge: 2017-11-22 | Disposition: A | Discharge status: Home / Self Care | Payer: Medicare HMO | Source: Ambulatory Visit | Attending: Nurse Practitioner | Admitting: Nurse Practitioner

## 2017-11-22 DIAGNOSIS — I82401 Acute embolism and thrombosis of unspecified deep veins of right lower extremity: Secondary | ICD-10-CM | POA: Diagnosis not present

## 2017-11-22 LAB — PROTIME-INR
INR: 3.22
Prothrombin Time: 32.7 seconds — ABNORMAL HIGH (ref 11.4–15.2)

## 2017-11-23 ENCOUNTER — Other Ambulatory Visit (INDEPENDENT_AMBULATORY_CARE_PROVIDER_SITE_OTHER): Payer: Self-pay | Admitting: Nurse Practitioner

## 2017-11-23 DIAGNOSIS — I82401 Acute embolism and thrombosis of unspecified deep veins of right lower extremity: Secondary | ICD-10-CM

## 2017-11-23 DIAGNOSIS — R531 Weakness: Secondary | ICD-10-CM | POA: Diagnosis not present

## 2017-11-23 DIAGNOSIS — R262 Difficulty in walking, not elsewhere classified: Secondary | ICD-10-CM | POA: Diagnosis not present

## 2017-11-23 NOTE — Progress Notes (Signed)
Please let patient know to take 78m tomorrow morning and Friday, then to resume her 9.5 mg dose.  We will re-check her next INR between 08/30-09/1

## 2017-11-29 DIAGNOSIS — R531 Weakness: Secondary | ICD-10-CM | POA: Diagnosis not present

## 2017-11-29 DIAGNOSIS — G4733 Obstructive sleep apnea (adult) (pediatric): Secondary | ICD-10-CM | POA: Diagnosis not present

## 2017-11-29 DIAGNOSIS — R262 Difficulty in walking, not elsewhere classified: Secondary | ICD-10-CM | POA: Diagnosis not present

## 2017-12-02 DIAGNOSIS — I82541 Chronic embolism and thrombosis of right tibial vein: Secondary | ICD-10-CM | POA: Diagnosis not present

## 2017-12-02 DIAGNOSIS — I82549 Chronic embolism and thrombosis of unspecified tibial vein: Secondary | ICD-10-CM | POA: Insufficient documentation

## 2017-12-02 DIAGNOSIS — R197 Diarrhea, unspecified: Secondary | ICD-10-CM | POA: Diagnosis not present

## 2017-12-02 DIAGNOSIS — G620 Drug-induced polyneuropathy: Secondary | ICD-10-CM | POA: Diagnosis not present

## 2017-12-02 DIAGNOSIS — Z86718 Personal history of other venous thrombosis and embolism: Secondary | ICD-10-CM | POA: Diagnosis not present

## 2017-12-02 DIAGNOSIS — A0472 Enterocolitis due to Clostridium difficile, not specified as recurrent: Secondary | ICD-10-CM | POA: Diagnosis not present

## 2017-12-02 DIAGNOSIS — R748 Abnormal levels of other serum enzymes: Secondary | ICD-10-CM | POA: Diagnosis not present

## 2017-12-02 DIAGNOSIS — D696 Thrombocytopenia, unspecified: Secondary | ICD-10-CM | POA: Diagnosis not present

## 2017-12-02 DIAGNOSIS — G4733 Obstructive sleep apnea (adult) (pediatric): Secondary | ICD-10-CM | POA: Diagnosis not present

## 2017-12-02 DIAGNOSIS — Z79899 Other long term (current) drug therapy: Secondary | ICD-10-CM | POA: Diagnosis not present

## 2017-12-02 DIAGNOSIS — R740 Nonspecific elevation of levels of transaminase and lactic acid dehydrogenase [LDH]: Secondary | ICD-10-CM | POA: Diagnosis not present

## 2017-12-02 DIAGNOSIS — L138 Other specified bullous disorders: Secondary | ICD-10-CM | POA: Diagnosis not present

## 2017-12-02 DIAGNOSIS — R05 Cough: Secondary | ICD-10-CM | POA: Diagnosis not present

## 2017-12-02 DIAGNOSIS — T371X5A Adverse effect of antimycobacterial drugs, initial encounter: Secondary | ICD-10-CM | POA: Diagnosis not present

## 2017-12-02 DIAGNOSIS — R42 Dizziness and giddiness: Secondary | ICD-10-CM | POA: Diagnosis not present

## 2017-12-13 DIAGNOSIS — R531 Weakness: Secondary | ICD-10-CM | POA: Diagnosis not present

## 2017-12-13 DIAGNOSIS — R262 Difficulty in walking, not elsewhere classified: Secondary | ICD-10-CM | POA: Diagnosis not present

## 2017-12-14 DIAGNOSIS — X58XXXD Exposure to other specified factors, subsequent encounter: Secondary | ICD-10-CM | POA: Diagnosis not present

## 2017-12-14 DIAGNOSIS — Z79899 Other long term (current) drug therapy: Secondary | ICD-10-CM | POA: Diagnosis not present

## 2017-12-14 DIAGNOSIS — G620 Drug-induced polyneuropathy: Secondary | ICD-10-CM | POA: Diagnosis not present

## 2017-12-14 DIAGNOSIS — L138 Other specified bullous disorders: Secondary | ICD-10-CM | POA: Diagnosis not present

## 2017-12-14 DIAGNOSIS — Z5181 Encounter for therapeutic drug level monitoring: Secondary | ICD-10-CM | POA: Diagnosis not present

## 2017-12-14 DIAGNOSIS — T371X5D Adverse effect of antimycobacterial drugs, subsequent encounter: Secondary | ICD-10-CM | POA: Diagnosis not present

## 2017-12-30 DIAGNOSIS — Z7901 Long term (current) use of anticoagulants: Secondary | ICD-10-CM | POA: Diagnosis not present

## 2018-01-09 DIAGNOSIS — Z7901 Long term (current) use of anticoagulants: Secondary | ICD-10-CM | POA: Diagnosis not present

## 2018-01-10 DIAGNOSIS — R262 Difficulty in walking, not elsewhere classified: Secondary | ICD-10-CM | POA: Diagnosis not present

## 2018-01-10 DIAGNOSIS — R531 Weakness: Secondary | ICD-10-CM | POA: Diagnosis not present

## 2018-01-16 DIAGNOSIS — G4733 Obstructive sleep apnea (adult) (pediatric): Secondary | ICD-10-CM | POA: Diagnosis not present

## 2018-01-17 DIAGNOSIS — R262 Difficulty in walking, not elsewhere classified: Secondary | ICD-10-CM | POA: Diagnosis not present

## 2018-01-17 DIAGNOSIS — R531 Weakness: Secondary | ICD-10-CM | POA: Diagnosis not present

## 2018-01-19 DIAGNOSIS — R262 Difficulty in walking, not elsewhere classified: Secondary | ICD-10-CM | POA: Diagnosis not present

## 2018-01-19 DIAGNOSIS — R531 Weakness: Secondary | ICD-10-CM | POA: Diagnosis not present

## 2018-01-23 DIAGNOSIS — Z7901 Long term (current) use of anticoagulants: Secondary | ICD-10-CM | POA: Diagnosis not present

## 2018-01-24 DIAGNOSIS — R531 Weakness: Secondary | ICD-10-CM | POA: Diagnosis not present

## 2018-01-24 DIAGNOSIS — R262 Difficulty in walking, not elsewhere classified: Secondary | ICD-10-CM | POA: Diagnosis not present

## 2018-01-31 ENCOUNTER — Ambulatory Visit (INDEPENDENT_AMBULATORY_CARE_PROVIDER_SITE_OTHER): Payer: Medicare HMO | Admitting: Vascular Surgery

## 2018-02-02 DIAGNOSIS — R531 Weakness: Secondary | ICD-10-CM | POA: Diagnosis not present

## 2018-02-02 DIAGNOSIS — R262 Difficulty in walking, not elsewhere classified: Secondary | ICD-10-CM | POA: Diagnosis not present

## 2018-02-15 DIAGNOSIS — R531 Weakness: Secondary | ICD-10-CM | POA: Diagnosis not present

## 2018-02-15 DIAGNOSIS — R262 Difficulty in walking, not elsewhere classified: Secondary | ICD-10-CM | POA: Diagnosis not present

## 2018-02-16 DIAGNOSIS — G4733 Obstructive sleep apnea (adult) (pediatric): Secondary | ICD-10-CM | POA: Diagnosis not present

## 2018-02-17 DIAGNOSIS — Z7901 Long term (current) use of anticoagulants: Secondary | ICD-10-CM | POA: Diagnosis not present

## 2018-02-22 DIAGNOSIS — R531 Weakness: Secondary | ICD-10-CM | POA: Diagnosis not present

## 2018-02-22 DIAGNOSIS — R262 Difficulty in walking, not elsewhere classified: Secondary | ICD-10-CM | POA: Diagnosis not present

## 2018-02-27 DIAGNOSIS — Z7901 Long term (current) use of anticoagulants: Secondary | ICD-10-CM | POA: Diagnosis not present

## 2018-03-10 DIAGNOSIS — Z7901 Long term (current) use of anticoagulants: Secondary | ICD-10-CM | POA: Diagnosis not present

## 2018-03-10 DIAGNOSIS — N390 Urinary tract infection, site not specified: Secondary | ICD-10-CM | POA: Diagnosis not present

## 2018-03-10 DIAGNOSIS — N200 Calculus of kidney: Secondary | ICD-10-CM | POA: Diagnosis not present

## 2018-03-14 DIAGNOSIS — H5213 Myopia, bilateral: Secondary | ICD-10-CM | POA: Diagnosis not present

## 2018-03-15 DIAGNOSIS — Z7952 Long term (current) use of systemic steroids: Secondary | ICD-10-CM | POA: Diagnosis not present

## 2018-03-15 DIAGNOSIS — Z79899 Other long term (current) drug therapy: Secondary | ICD-10-CM | POA: Diagnosis not present

## 2018-03-15 DIAGNOSIS — L138 Other specified bullous disorders: Secondary | ICD-10-CM | POA: Diagnosis not present

## 2018-03-17 ENCOUNTER — Ambulatory Visit: Payer: Medicare HMO | Admitting: Neurology

## 2018-03-17 ENCOUNTER — Encounter: Payer: Self-pay | Admitting: Neurology

## 2018-03-17 VITALS — BP 140/70 | HR 106 | Ht 63.0 in | Wt 177.4 lb

## 2018-03-17 DIAGNOSIS — T371X5A Adverse effect of antimycobacterial drugs, initial encounter: Secondary | ICD-10-CM | POA: Diagnosis not present

## 2018-03-17 DIAGNOSIS — M2550 Pain in unspecified joint: Secondary | ICD-10-CM | POA: Diagnosis not present

## 2018-03-17 DIAGNOSIS — G620 Drug-induced polyneuropathy: Secondary | ICD-10-CM | POA: Diagnosis not present

## 2018-03-17 NOTE — Progress Notes (Signed)
Follow-up Visit   Date: 03/17/18    Lindsay Stephenson MRN: [id number] DOB: 02-Dec-1945   Interim History: Lindsay Stephenson is a 72 y.o. 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  The patient was accompanied to the clinic by husband who also provides collateral information.    History of present illness: She was started on dapsone in 2016 for linear IgA bullous dermatosa.  She moved from [place] to [facility] in May 2017.  Around this same time, she started having increased falls, developed progressive weakness of the legs, burning/stabbing pain of the legs, and falls.  She began using a cane in September 2017 and a rollator walker in March 2018.  Since using a walker, she has only fallen once.  She has constant severe pain of the lower legs, described as burning, stabbing, knife-like.  Because of the severity of her weakness, she is unable to raise the legs which was initially mistaken for Parkinson's gait.  She saw Dr. Carles Collet (movement disorder specialist) who stopped sinemet and did not see that she has Parkinson's. For her hip pain, she saw Dr. French Ana (ortho) who noted very mild degenerative changes of the hips and did not see that her gait difficulty with stemming from a primary orthopeadic problem.  Most recently, she saw Dr. Vertell Limber (neurosurgeon) for multilevel cervical canal stenosis and he recommended to follow symptoms clinically and continue further neurological work-up for neuropathy.  Of note, she reports having tingling sensation of the lateral thigh for 10 years.  She admits to always being overweight.  UPDATE 03/07/2017:  Within a month of stopping dapsone, she was able to start moving her toes.  She was started on methotrexate and prednisone for her dermatosa.  She is working very hard with water and land therapy, but has noticed a some improvement in her leg strength such that she is able to raise the leg  higher.  UPDATE 09/12/2017:  In March, she was hospitalized for c.difficile colitis where she was nonambulatory for 9 days and became significantly much weaker due to deconditioning.  She has been back home since March, she has been making slow progress with her leg strength with physical therapy and will be starting out-patient PT.  She is able to walk with a walker and rollator, but relies on wheelchair for long distances.    UPDATE 03/17/2018:  She is here for follow-up visit.  She has been engaged in physical therapy at [facility] and has been able to walk with rollator since [place].  She is doing her home exercises and was discharged last week from out-patient therapy.  She uses the wheelchair only for long distances.  She has not suffered any falls.  She is complaining of morning stiffness of the legs, knees, and feet, described as achy pain.  She feels that after moving her joints/legs, stiffness improves.  Medications:  Current Outpatient Medications on File Prior to Visit  Medication Sig Dispense Refill  . acetaminophen (TYLENOL) 325 MG tablet Take 650 mg by mouth.    Marland Kitchen albuterol (PROVENTIL HFA;VENTOLIN HFA) 108 (90 Base) MCG/ACT inhaler Inhale 1-2 puffs into the lungs every 6 (six) hours as needed for wheezing or shortness of breath. 1 Inhaler 1  . AMBULATORY NON FORMULARY MEDICATION 1 Units by Other route daily. Walk in bathtub/shower 1 Units 0  . Calcium Carb-Ergocalciferol 250-125 MG-UNIT TABS Take by mouth.    . clobetasol (TEMOVATE) 0.05 % GEL  Apply topically.    . cyanocobalamin (,VITAMIN B-12,) 1000 MCG/ML injection 1 dose monthly x 6 months    . dapsone 100 MG tablet 1 tablet daily. Derm IN AM    . estradiol (ESTRACE VAGINAL) 0.1 MG/GM vaginal cream Place vaginally.    . ferrous sulfate 325 (65 FE) MG tablet Take by mouth.    . folic acid (FOLVITE) 1 MG tablet Take 1 mg by mouth daily.     . hydrocortisone (ANUSOL-HC) 2.5 % rectal cream Place 1 application rectally 2 (two)  times daily. 30 g 1  . hydrocortisone (ANUSOL-HC) 25 MG suppository Place 1 suppository (25 mg total) rectally at bedtime. 24 suppository 1  . Hydrocortisone (GERHARDT'S BUTT CREAM) CREA Apply 1 application topically daily. 1 each 0  . levocetirizine (XYZAL) 5 MG tablet Take 1 tablet (5 mg total) by mouth as needed. 90 tablet 1  . Melatonin 3 MG TABS Take by mouth.    . methotrexate 2.5 MG tablet Take 10 mg by mouth once a week. Takes on Fridays    . Multiple Vitamin (MULTIVITAMIN) capsule Take 1 capsule by mouth daily.    Marland Kitchen omeprazole (PRILOSEC) 20 MG capsule Take 1 capsule (20 mg total) by mouth daily. 90 capsule 3  . polyethylene glycol (MIRALAX / GLYCOLAX) packet Take by mouth.    . potassium chloride SA (K-DUR,KLOR-CON) 20 MEQ tablet Take 1 tablet (20 mEq total) by mouth 2 (two) times daily. 60 tablet 1  . predniSONE (DELTASONE) 5 MG tablet Take 7.5 mg by mouth.     . Probiotic Product (TRUNATURE PROBIOTIC FOR KIDS) CHEW Chew 1 each by mouth daily.    . TRIAMCINOLONE ACETONIDE, TOP, (TRIANEX) 0.05 % OINT     . Vitamin D, Ergocalciferol, (DRISDOL) 1.25 MG (50000 UT) CAPS capsule Take one capsule once a week for 8 weeks    . warfarin (COUMADIN) 4 MG tablet Take 1 tablet (4 mg total) by mouth daily. 30 tablet 0   No current facility-administered medications on file prior to visit.     Allergies:  Allergies  Allergen Reactions  . Entex Lq [Phenylephrine-Guaifenesin] Swelling    TONGUE  . Phenylephrine-Guaifenesin Swelling  . Sulfa Antibiotics Itching and Hives  . Amoxicillin-Pot Clavulanate     Other reaction(s): Other (See Comments) Loss of appetite, weakness  . Codeine Hives and Other (See Comments)    Pt reports only having symptoms after receiving large amounts of medication  . Tape Other (See Comments)    CAN PULL OFF LAYER OF SKIN    Review of Systems:  CONSTITUTIONAL: No fevers, chills, night sweats, or weight loss.  EYES: No visual changes or eye pain ENT: No hearing  changes.  No history of nose bleeds.   RESPIRATORY: No cough, wheezing and shortness of breath.   CARDIOVASCULAR: Negative for chest pain, and palpitations.   GI: Negative for abdominal discomfort, blood in stools or black stools.  No recent change in bowel habits.   GU:  No history of incontinence.   MUSCLOSKELETAL: No history of joint pain or swelling.  No myalgias.   SKIN: Negative for lesions, rash, and itching.   ENDOCRINE: Negative for cold or heat intolerance, polydipsia or goiter.   PSYCH:  No depression + anxiety symptoms.   NEURO: As Above.   Vital Signs:  BP 140/70   Pulse (!) 106   Ht 5' 3"  (1.6 m)   Wt 177 lb 6 oz (80.5 kg)   SpO2 99%   BMI 31.42  kg/m   General Medical Exam:   General:  Well appearing, comfortable  Eyes/ENT: see cranial nerve examination.   Neck: No masses appreciated.  Full range of motion without tenderness.  No carotid bruits. Respiratory:  Clear to auscultation, good air entry bilaterally.   Cardiac:  Regular rate and rhythm, no murmur.   Ext:  No edema  Neurological Exam: MENTAL STATUS including orientation to time, place, person, recent and remote memory, attention span and concentration, language, and fund of knowledge is normal.  Speech is not dysarthric.  CRANIAL NERVES:  Pupils equal round and reactive to light.  Normal conjugate, extra-ocular eye movements in all directions of gaze.  No ptosis  Face is symmetric. Palate elevates symmetrically.  Tongue is midline.  MOTOR:  R TA atrophy, no fasciculations or abnormal movements.  No pronator drift.  Tone is normal.  She is able to raise both legs higher, about 3 inches off the chair, which is improved.  Right Upper Extremity:    Left Upper Extremity:    Deltoid  5/5   Deltoid  5/5   Biceps  5/5   Biceps  5/5   Triceps  5/5   Triceps  5/5   Wrist extensors  5/5   Wrist extensors  5/5   Wrist flexors  5/5   Wrist flexors  5/5   Finger extensors  5/5   Finger extensors  5/5   Finger  flexors  5/5   Finger flexors  5/5   Dorsal interossei  4/5   Dorsal interossei  4/5   Abductor pollicis  5/5   Abductor pollicis  5/5   Tone (Ashworth scale)  0  Tone (Ashworth scale)  0   Right Lower Extremity:    Left Lower Extremity:    Hip flexors  4/5   Hip flexors  4/5   Hip extensors  5/5   Hip extensors  5/5   Knee flexors  5-/5   Knee flexors  5-/5   Knee extensors  5/5   Knee extensors  5/5   Dorsiflexors  4/5   Dorsiflexors  4/5   Plantarflexors  4/5   Plantarflexors  4/5   Toe extensors  3/5   Toe extensors  4/5   Toe flexors  3/5   Toe flexors  4/5   Tone (Ashworth scale)  0  Tone (Ashworth scale)  0   MSRs:  Reflexes are 2+/4 throughout, except 3+/4 bilateral patella.  SENSORY:  Intact to vibration throughout.  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    A multilevel intraspinal canal lesion (i.e. radiculopathy, disorder of anterior horn cells, etc.) is thought to be less likely. Clinical correlation recommended.   MRI cervical spine wo contrast 07/17/2016: Cervical spondylosis and degenerative disc disease, causing moderate impingement at C4-5, C5-6, and C6-7 ; and mild impingement at C2-3, as detailed above.  MRI lumbar spine wo contrast 03/16/2016: 1. At L3-4 there is a broad-based disc osteophyte complex. Mild bilateral facet arthropathy. Bilateral lateral recess stenosis. Bilateral mild foraminal narrowing. Mild central canal narrowing. 2. At L4-5 there is a broad-based disc bulge with a small central disc protrusion. Bilateral facet arthropathy. Bilateral lateral recess narrowing. Mild bilateral foraminal stenosis. Mild bilateral facet arthropathy.   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 She has been making slow progress with PT and now ambulates with a rollator, uses a wheelchair only for long distances.  She continues to have right > left foot drop and right proximal leg weakness.  Continue to use AFO and home exercises for leg strengthening.  We can refer her back to PT for leg strengthening at [facility] in [place], when she is ready.  She had many questions regarding prognosis and strategies to maximize recovery which was addressed.   Polyarthralgia, morning stiffness is most suggestive of osteoarthritis.  Follow-up with PCP.   Return to clinic in 6 months  Greater than 50% of this 25 minute visit was spent in counseling, explanation of diagnosis, planning of further management, and coordination of care.   Thank you for allowing me to participate in patient's care.  If I can answer any additional questions, I would be pleased to do so.    Sincerely,     K. Posey Pronto, DO

## 2018-03-17 NOTE — Patient Instructions (Signed)
Keep up the great work!  Continue your home exercises  I will see you back in 6 months

## 2018-03-21 DIAGNOSIS — G4733 Obstructive sleep apnea (adult) (pediatric): Secondary | ICD-10-CM | POA: Diagnosis not present

## 2018-03-22 DIAGNOSIS — N390 Urinary tract infection, site not specified: Secondary | ICD-10-CM | POA: Diagnosis not present

## 2018-04-03 DIAGNOSIS — G4733 Obstructive sleep apnea (adult) (pediatric): Secondary | ICD-10-CM | POA: Diagnosis not present

## 2018-04-03 DIAGNOSIS — T371X5A Adverse effect of antimycobacterial drugs, initial encounter: Secondary | ICD-10-CM | POA: Diagnosis not present

## 2018-04-03 DIAGNOSIS — G620 Drug-induced polyneuropathy: Secondary | ICD-10-CM | POA: Diagnosis not present

## 2018-04-03 DIAGNOSIS — I82541 Chronic embolism and thrombosis of right tibial vein: Secondary | ICD-10-CM | POA: Diagnosis not present

## 2018-04-03 DIAGNOSIS — L138 Other specified bullous disorders: Secondary | ICD-10-CM | POA: Diagnosis not present

## 2018-04-03 DIAGNOSIS — R2689 Other abnormalities of gait and mobility: Secondary | ICD-10-CM | POA: Diagnosis not present

## 2018-04-03 DIAGNOSIS — E538 Deficiency of other specified B group vitamins: Secondary | ICD-10-CM | POA: Diagnosis not present

## 2018-04-03 DIAGNOSIS — I471 Supraventricular tachycardia: Secondary | ICD-10-CM | POA: Diagnosis not present

## 2018-04-03 DIAGNOSIS — I10 Essential (primary) hypertension: Secondary | ICD-10-CM | POA: Diagnosis not present

## 2018-07-31 ENCOUNTER — Ambulatory Visit: Payer: Self-pay

## 2018-08-30 ENCOUNTER — Telehealth: Payer: Self-pay | Admitting: Neurology

## 2018-08-30 NOTE — Telephone Encounter (Signed)
Patient is calling in needing PT orders sent over to the [facility] in [place] so she can continue PT. Please send. Thanks!

## 2018-08-31 ENCOUNTER — Other Ambulatory Visit: Payer: Self-pay | Admitting: Neurology

## 2018-08-31 DIAGNOSIS — R29898 Other symptoms and signs involving the musculoskeletal system: Secondary | ICD-10-CM

## 2018-08-31 DIAGNOSIS — R269 Unspecified abnormalities of gait and mobility: Secondary | ICD-10-CM

## 2018-08-31 NOTE — Telephone Encounter (Signed)
OK to send referral for leg strengthening to [facility] in [place].

## 2018-08-31 NOTE — Telephone Encounter (Signed)
Please advise 

## 2018-08-31 NOTE — Telephone Encounter (Signed)
Ordered/Faxed [name]- Fax [phone]

## 2018-09-18 ENCOUNTER — Ambulatory Visit: Payer: Medicare HMO | Admitting: Neurology

## 2018-11-10 ENCOUNTER — Encounter: Payer: Self-pay | Admitting: Neurology

## 2018-11-13 ENCOUNTER — Other Ambulatory Visit: Payer: Self-pay

## 2018-11-13 ENCOUNTER — Ambulatory Visit (INDEPENDENT_AMBULATORY_CARE_PROVIDER_SITE_OTHER): Payer: Medicare HMO | Admitting: Neurology

## 2018-11-13 VITALS — BP 161/72 | HR 86 | Temp 98.0°F | Ht 60.0 in | Wt 171.8 lb

## 2018-11-13 DIAGNOSIS — M21372 Foot drop, left foot: Secondary | ICD-10-CM | POA: Diagnosis not present

## 2018-11-13 DIAGNOSIS — M21371 Foot drop, right foot: Secondary | ICD-10-CM | POA: Diagnosis not present

## 2018-11-13 DIAGNOSIS — G620 Drug-induced polyneuropathy: Secondary | ICD-10-CM

## 2018-11-13 DIAGNOSIS — T371X5A Adverse effect of antimycobacterial drugs, initial encounter: Secondary | ICD-10-CM

## 2018-11-13 NOTE — Patient Instructions (Addendum)
Continue to do your physical therapy exercises  NCS/EMG of the right arm and leg to be scheduled in November  ELECTROMYOGRAM [place] (EMG/NCS) INSTRUCTIONS  How to Prepare The neurologist conducting the EMG will need to know if you have certain medical conditions. Tell the neurologist and other EMG lab personnel if you: . Have a pacemaker or any other electrical medical device . Take blood-thinning medications . Have hemophilia, a blood-clotting disorder that causes prolonged bleeding Bathing Take a shower or bath shortly before your exam in order to remove oils from your skin. Don't apply lotions or creams before the exam.  What to Expect You'll likely be asked to change into a hospital gown for the procedure and lie down on an examination table. The following explanations can help you understand what will happen during the exam.  . Electrodes. The neurologist or a technician places surface electrodes at various locations on your skin depending on where you're experiencing symptoms. Or the neurologist may insert needle electrodes at different sites depending on your symptoms.  . Sensations. The electrodes will at times transmit a tiny electrical current that you may feel as a twinge or spasm. The needle electrode may cause discomfort or pain that usually ends shortly after the needle is removed. If you are concerned about discomfort or pain, you may want to talk to the neurologist about taking a short break during the exam.  . Instructions. During the needle EMG, the neurologist will assess whether there is any spontaneous electrical activity when the muscle is at rest - activity that isn't present in healthy muscle tissue - and the degree of activity when you slightly contract the muscle.  He or she will give you instructions on resting and contracting a muscle at appropriate times. Depending on what muscles and nerves the neurologist is examining, he or she may ask you to  change positions during the exam.  After your EMG You may experience some temporary, minor bruising where the needle electrode was inserted into your muscle. This bruising should fade within several days. If it persists, contact your primary care doctor.

## 2018-11-13 NOTE — Progress Notes (Signed)
Follow-up Visit   Date: 11/13/18    BURNADETTE BASKETT MRN: [id number] DOB: [birth year]   Interim History: Lindsay Stephenson is a 73 y.o. 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  The patient was accompanied to the clinic by husband who also provides collateral information.    History of present illness: She was started on dapsone in 2016 for linear IgA bullous dermatosa.  She moved from [place] to [facility] in May 2017.  Around this same time, she started having increased falls, developed progressive weakness of the legs, burning/stabbing pain of the legs, and falls.  She began using a cane in September 2017 and a rollator walker in March 2018.  Since using a walker, she has only fallen once.  She has constant severe pain of the lower legs, described as burning, stabbing, knife-like.  Because of the severity of her weakness, she is unable to raise the legs which was initially mistaken for Parkinson's gait.  She saw Dr. Carles Collet (movement disorder specialist) who stopped sinemet and did not see that she has Parkinson's. For her hip pain, she saw Dr. French Ana (ortho) who noted very mild degenerative changes of the hips and did not see that her gait difficulty with stemming from a primary orthopeadic problem.  Most recently, she saw Dr. Vertell Limber (neurosurgeon) for multilevel cervical canal stenosis and he recommended to follow symptoms clinically and continue further neurological work-up for neuropathy.  Of note, she reports having tingling sensation of the lateral thigh for 10 years.  She admits to always being overweight.  UPDATE 03/07/2017:  Within a month of stopping dapsone, she was able to start moving her toes.  She was started on methotrexate and prednisone for her dermatosa.  She is working very hard with water and land therapy, but has noticed a some improvement in her leg strength such that she is able to raise the leg  higher.  UPDATE 09/12/2017:  In March, she was hospitalized for c.difficile colitis where she was nonambulatory for 9 days and became significantly much weaker due to deconditioning.  She has been back home since March, she has been making slow progress with her leg strength with physical therapy and will be starting out-patient PT.  She is able to walk with a walker and rollator, but relies on wheelchair for long distances.    UPDATE 11/13/2018:  She is here for follow-up visit.  She continues to have weakness in both feet, which is worse on the right.  She is regularly going to physical therapy and feels this may have helped her proximal leg strength, however no improvement in her feet.  Her therapist is also noticed that she is hyperextending her left knee and recommended using a knee brace.  She has also noticed a new muscle twitch which occurs in the right thigh.  There is no pain associated with it.  She complains of generalized joint pain and stiffness. She was prescribed gabapentin at bedtime  Medications:  Current Outpatient Medications on File Prior to Visit  Medication Sig Dispense Refill  . acetaminophen (TYLENOL) 325 MG tablet Take 650 mg by mouth.    Marland Kitchen albuterol (PROVENTIL HFA;VENTOLIN HFA) 108 (90 Base) MCG/ACT inhaler Inhale 1-2 puffs into the lungs every 6 (six) hours as needed for wheezing or shortness of breath. 1 Inhaler 1  . AMBULATORY NON FORMULARY MEDICATION 1 Units by Other route daily. Walk in bathtub/shower 1 Units 0  .  Calcium Carb-Ergocalciferol 250-125 MG-UNIT TABS Take by mouth.    . clobetasol (TEMOVATE) 0.05 % GEL Apply topically.    . cyanocobalamin (,VITAMIN B-12,) 1000 MCG/ML injection 1 dose monthly x 6 months    . dapsone 100 MG tablet 1 tablet daily. Derm IN AM    . ferrous sulfate 325 (65 FE) MG tablet Take by mouth.    . folic acid (FOLVITE) 1 MG tablet Take 1 mg by mouth daily.     . Melatonin 3 MG TABS Take by mouth.    . methotrexate 2.5 MG tablet Take 10  mg by mouth once a week. Takes on Fridays    . Multiple Vitamin (MULTIVITAMIN) capsule Take 1 capsule by mouth daily.    Marland Kitchen omeprazole (PRILOSEC) 20 MG capsule Take 1 capsule (20 mg total) by mouth daily. 90 capsule 3  . polyethylene glycol (MIRALAX / GLYCOLAX) packet Take by mouth.    . predniSONE (DELTASONE) 5 MG tablet Take 5 mg by mouth.     . Probiotic Product (TRUNATURE PROBIOTIC FOR KIDS) CHEW Chew 1 each by mouth daily.    Marland Kitchen Spacer/Aero-Holding Chambers (AEROCHAMBER MV) inhaler     . hydrocortisone (ANUSOL-HC) 2.5 % rectal cream Place 1 application rectally 2 (two) times daily. 30 g 1  . hydrocortisone (ANUSOL-HC) 25 MG suppository Place 1 suppository (25 mg total) rectally at bedtime. 24 suppository 1  . Hydrocortisone (GERHARDT'S BUTT CREAM) CREA Apply 1 application topically daily. 1 each 0  . levocetirizine (XYZAL) 5 MG tablet Take 1 tablet (5 mg total) by mouth as needed. (Patient not taking: Reported on 11/10/2018) 90 tablet 1  . potassium chloride SA (K-DUR,KLOR-CON) 20 MEQ tablet Take 1 tablet (20 mEq total) by mouth 2 (two) times daily. 60 tablet 1  . TRIAMCINOLONE ACETONIDE, TOP, (TRIANEX) 0.05 % OINT     . Vitamin D, Ergocalciferol, (DRISDOL) 1.25 MG (50000 UT) CAPS capsule Take one capsule once a week for 8 weeks    . warfarin (COUMADIN) 4 MG tablet Take 1 tablet (4 mg total) by mouth daily. 30 tablet 0   No current facility-administered medications on file prior to visit.     Allergies:  Allergies  Allergen Reactions  . Entex Lq [Phenylephrine-Guaifenesin] Swelling    TONGUE  . Phenylephrine-Guaifenesin Swelling  . Sulfa Antibiotics Itching and Hives  . Amoxicillin-Pot Clavulanate     Other reaction(s): Other (See Comments) Loss of appetite, weakness  . Codeine Hives and Other (See Comments)    Pt reports only having symptoms after receiving large amounts of medication  . Tape Other (See Comments)    CAN PULL OFF LAYER OF SKIN    Review of Systems:   CONSTITUTIONAL: No fevers, chills, night sweats, or weight loss.  EYES: No visual changes or eye pain ENT: No hearing changes.  No history of nose bleeds.   RESPIRATORY: No cough, wheezing and shortness of breath.   CARDIOVASCULAR: Negative for chest pain, and palpitations.   GI: Negative for abdominal discomfort, blood in stools or black stools.  No recent change in bowel habits.   GU:  No history of incontinence.   MUSCLOSKELETAL: No history of joint pain or swelling.  No myalgias.   SKIN: Negative for lesions, rash, and itching.   ENDOCRINE: Negative for cold or heat intolerance, polydipsia or goiter.   PSYCH:  No depression + anxiety symptoms.   NEURO: As Above.   Vital Signs:  BP (!) 161/72   Pulse 86   Temp 98  F (36.7 C)   Ht 5' (1.524 m)   Wt 171 lb 12.8 oz (77.9 kg)   SpO2 97%   BMI 33.55 kg/m   General Medical Exam:   General:  Well appearing, comfortable  Eyes/ENT: see cranial nerve examination.   Neck: No masses appreciated.  Full range of motion without tenderness.  No carotid bruits. Respiratory:  Clear to auscultation, good air entry bilaterally.   Cardiac:  Regular rate and rhythm, no murmur.   Ext:  No edema  Neurological Exam: MENTAL STATUS including orientation to time, place, person, recent and remote memory, attention span and concentration, language, and fund of knowledge is normal.  Speech is not dysarthric.  CRANIAL NERVES:  Pupils equal round and reactive to light.  Normal conjugate, extra-ocular eye movements in all directions of gaze.  No ptosis  Face is symmetric. Palate elevates symmetrically.  Tongue is midline.  MOTOR:  R TA atrophy, episodic right vastus medialis fasciculations are seen.  She has bilateral intrinsic hand muscle atrophy.  Fasciculations are not present in the upper extremities or left lower extremity. No pronator drift.  Tone is normal.   Right Upper Extremity:    Left Upper Extremity:    Deltoid  5/5   Deltoid  5/5   Biceps   5/5   Biceps  5/5   Triceps  5/5   Triceps  5/5   Wrist extensors  5/5   Wrist extensors  5/5   Wrist flexors  5/5   Wrist flexors  5/5   Finger extensors  5/5   Finger extensors  5/5   Finger flexors  5/5   Finger flexors  5/5   Dorsal interossei  4/5   Dorsal interossei  4/5   Abductor pollicis  5/5   Abductor pollicis  5/5   Tone (Ashworth scale)  0  Tone (Ashworth scale)  0   Right Lower Extremity:    Left Lower Extremity:    Hip flexors  4+/5   Hip flexors  4+/5   Hip extensors  5/5   Hip extensors  5/5   Knee flexors  5-/5   Knee flexors  5-/5   Knee extensors  5/5   Knee extensors  5/5   Dorsiflexors  4/5   Dorsiflexors  3+/5   Plantarflexors  4/5   Plantarflexors  4-/5   Toe extensors  3/5   Toe extensors  3/5   Toe flexors  3/5   Toe flexors  3/5   Tone (Ashworth scale)  0  Tone (Ashworth scale)  0   MSRs:  Reflexes are 2+/4 throughout, except 3+/4 bilateral patella.  SENSORY:  Intact to vibration throughout.  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    A multilevel intraspinal canal lesion (i.e. radiculopathy, disorder of anterior horn cells, etc.) is thought to be less likely. Clinical correlation recommended.   MRI cervical spine wo contrast 07/17/2016: Cervical spondylosis and degenerative disc disease, causing moderate impingement at C4-5, C5-6, and C6-7 ; and mild impingement at C2-3, as detailed above.  MRI lumbar spine wo contrast 03/16/2016: 1. At L3-4 there is a broad-based disc osteophyte complex. Mild bilateral facet arthropathy. Bilateral lateral recess stenosis. Bilateral mild foraminal narrowing. Mild central canal narrowing. 2. At L4-5 there is a broad-based disc bulge with a small central disc protrusion. Bilateral facet  arthropathy. Bilateral lateral recess  narrowing. Mild bilateral foraminal stenosis. Mild bilateral facet arthropathy.   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  On exam today she does have fasciculations in the left vastus medialis muscle, which is new.  With her worsening distal motor strength and new fasciculations, repeat electrodiagnostic testing of the right arm and leg will be ordered to evaluate for an evolving process, such as motor neuron disease.  She was encouraged to continue physical therapy.  She had many questions regarding prognosis which was answered to the best of my ability.  Polyarthralgia, morning stiffness is most suggestive of osteoarthritis.  Follow-up with PCP.   Return to clinic after EMG  Greater than 50% of this 30 minute visit was spent in counseling, explanation of diagnosis, planning of further management, and coordination of care.    Thank you for allowing me to participate in patient's care.  If I can answer any additional questions, I would be pleased to do so.    Sincerely,    Donika K. Posey Pronto, DO

## 2018-12-26 ENCOUNTER — Encounter: Payer: Medicare HMO | Admitting: Neurology

## 2018-12-27 ENCOUNTER — Other Ambulatory Visit: Payer: Self-pay | Admitting: Family Medicine

## 2019-01-02 ENCOUNTER — Telehealth: Payer: Self-pay | Admitting: Neurology

## 2019-01-02 NOTE — Telephone Encounter (Signed)
Please inform here the number is [phone] and [phone], we dont do the authorizaitons

## 2019-01-02 NOTE — Telephone Encounter (Signed)
Sounds good. I will let her know. Thank you

## 2019-01-02 NOTE — Telephone Encounter (Signed)
Holland Falling called regarding this patient and the patient has has an upcoming test in November and the patient is needing a CPT Code to make sure that the test will be covered. You can call [employer] [phone] or Patient at [phone]. Thanks

## 2019-02-06 ENCOUNTER — Other Ambulatory Visit: Payer: Self-pay

## 2019-02-06 ENCOUNTER — Ambulatory Visit: Payer: Medicare HMO | Admitting: Neurology

## 2019-02-06 DIAGNOSIS — G620 Drug-induced polyneuropathy: Secondary | ICD-10-CM

## 2019-02-06 DIAGNOSIS — T371X5A Adverse effect of antimycobacterial drugs, initial encounter: Secondary | ICD-10-CM

## 2019-02-06 NOTE — Procedures (Addendum)
[facility]  [place], [place]  [place], [place] [address] Tel: [phone] Fax:  [phone] Test Date:  02/06/2019  Patient: Lindsay Stephenson DOB: January 18, 1946 Physician: Narda Amber, DO  Sex: Female Height: 5' 0"  Ref Phys: Narda Amber, DO  ID#: [id number] Temp: 32.0C Technician:    Patient Complaints: This is a 73 year old female previously diagnosed with dapsone-induced motor neuropathy referred for evaluation of progressive arm and leg weakness.    NCV & EMG Findings: Extensive electrodiagnostic testing of the right upper extremity, right lower extremity, as well as thoracic paraspinal muscles shows:  1. All sensory responses including the right median, ulnar, sural, and superficial peroneal nerves are within normal limits. 2. Right median motor responses within normal limits.  Right ulnar motor response shows reduced amplitude (5.1 mV) and decreased conduction velocity (A Elbow-B Elbow, 45 m/s).  Right peroneal and tibial motor responses are markedly reduced and show interval loss of amplitude when compared to prior study on 09/21/2016. 3. Right tibial H reflex study is within normal limits. 4. In the right upper extremity, chronic motor axonal loss changes are seen affecting all the tested muscles with active denervation in the first dorsal interosseous and biceps muscles. 5. In the right lower extremity, chronic motor axonal loss changes are seen affecting all the tested muscles which is severe in the anterior tibialis and gluteus medius muscle where there is evidence of active denervation. 6. Fibrillation potentials are not present in the cervical, thoracic, or lumbar paraspinal muscles. 7. Fasciculation potentials are not seen in any of the tested muscles.  Impression: The electrophysiologic findings are suggestive of a active and chronic generalized disorder of the motor neurons and/or the axons affecting the right lower more than upper extremities.  Compared to  prior study on 09/21/2016, there is an interval progression suggesting an evolving process.  There is also evidence of a right ulnar neuropathy with slowing across the elbow, demyelinating and axonal loss in type, moderate.   ___________________________ Narda Amber, DO    Nerve Conduction Studies Anti Sensory Summary Table   Site NR Peak (ms) Norm Peak (ms) P-T Amp (V) Norm P-T Amp  Right Median Anti Sensory (2nd Digit)  32C  Wrist    2.8 <3.8 34.3 >10  Right Sup Peroneal Anti Sensory (Ant Lat Mall)  31C  12 cm    2.8 <4.6 16.3 >3  Right Sural Anti Sensory (Lat Mall)  31C  Calf    3.1 <4.6 15.5 >3  Right Ulnar Anti Sensory (5th Digit)  32C  Wrist    2.7 <3.2 10.9 >5   Motor Summary Table   Site NR Onset (ms) Norm Onset (ms) O-P Amp (mV) Norm O-P Amp Site1 Site2 Delta-0 (ms) Dist (cm) Vel (m/s) Norm Vel (m/s)  Right Median Motor (Abd Poll Brev)  32C  Wrist    3.0 <4.0 6.9 >5 Elbow Wrist 5.3 27.0 51 >50  Elbow    8.3  6.3         Right Peroneal Motor (Ext Dig Brev)  31C  Ankle    5.2 <6.0 0.6 >2.5 B Fib Ankle 8.6 35.0 41 >40  B Fib    13.8  0.6  Poplt B Fib 1.5 8.0 53 >40  Poplt    15.3  0.6         Right Peroneal TA Motor (Tib Ant)  31C  Fib Head    2.5 <4.5 1.0 >3 Poplit Fib Head 1.4 7.0 50 >40  Poplit  3.9  0.6         Right Tibial Motor (Abd Hall Brev)  31C  Ankle    4.5 <6.0 1.1 >4 Knee Ankle 8.6 37.0 43 >40  Knee    13.1  0.5         Right Ulnar Motor (Abd Dig Minimi)  32C  Wrist    3.0 <3.1 5.1 >7 B Elbow Wrist 3.5 22.0 63 >50  B Elbow    6.5  4.6  A Elbow B Elbow 2.2 10.0 45 >50  A Elbow    8.7  3.7          H Reflex Studies   NR H-Lat (ms) Lat Norm (ms) L-R H-Lat (ms)  Right Tibial (Gastroc)  31C     31.56 <35    EMG   Side Muscle Ins Act Fibs Psw Fasc Number Recrt Dur Dur. Amp Amp. Poly Poly. Comment  Right 1stDorInt Nml 1+ Nml Nml [facility] Rapid Most 1+ Most 1+ Most 1+ N/A  Right Ext Indicis Nml Nml Nml Nml 2- Rapid Some 1+ Some 1+ Some 1+ N/A   Right PronatorTeres Nml Nml Nml Nml 2- Rapid Some 1+ Some 1+ Some 1+ N/A  Right Biceps 1+ Nml Nml Nml 3- Rapid Many 1+ Many 1+ Many 1+ N/A  Right Triceps Nml Nml Nml Nml 1- Rapid Many 1+ Many 1+ Many 1+ N/A  Right Deltoid Nml Nml Nml Nml 3- Rapid Most 1+ Most 1+ Most 1+ N/A  Right AntTibialis Nml 1+ Nml Nml [facility]U Rapid All 1+ All 1+ All 1+ N/A  Right Gastroc Nml Nml Nml Nml 2- Rapid Most 1+ Most 1+ Most 1+ N/A  Right Flex Dig Long Nml Nml Nml Nml 3- Rapid All 1+ All 1+ All 1+ N/A  Right RectFemoris Nml Nml Nml Nml 3- Rapid Many 1+ Many 1+ Many 1+ N/A  Right GluteusMed Nml 1+ Nml Nml [facility] Rapid All 1+ All 1+ All 1+ N/A  Right Lumbo Parasp Low Nml Nml Nml Nml NE - - - - - - - N/A  Right Cervical Parasp Low Nml Nml Nml Nml NE - - - - - - - N/A  Right T11 Parasp Nml Nml Nml Nml NE - - - - - - - N/A      Waveforms:

## 2019-02-13 ENCOUNTER — Encounter: Payer: Self-pay | Admitting: Neurology

## 2019-02-14 ENCOUNTER — Telehealth: Payer: Self-pay | Admitting: Neurology

## 2019-02-14 NOTE — Telephone Encounter (Signed)
Called and spoke with Mr. Kaylina Cahue, the patient's husband (okay per DPR), and scheduled the patient for a visit to discuss her results on Monday, 02/19/19 at 11:10 AM.   He said the patient had a fall on 02/12/19 and she's hurt her left knee. She went to [facility] yesterday and had some imaging but nothing was broken in the fall.

## 2019-02-19 ENCOUNTER — Encounter: Payer: Self-pay | Admitting: Neurology

## 2019-02-19 ENCOUNTER — Other Ambulatory Visit: Payer: Self-pay

## 2019-02-19 ENCOUNTER — Telehealth (INDEPENDENT_AMBULATORY_CARE_PROVIDER_SITE_OTHER): Payer: Medicare HMO | Admitting: Neurology

## 2019-02-19 VITALS — BP 128/83 | Ht 60.0 in | Wt 168.0 lb

## 2019-02-19 DIAGNOSIS — G6289 Other specified polyneuropathies: Secondary | ICD-10-CM | POA: Diagnosis not present

## 2019-02-19 NOTE — Progress Notes (Signed)
Virtual Visit via Video Note The purpose of this virtual visit is to provide medical care while limiting exposure to the novel coronavirus.    Consent was obtained for video visit:  Yes.   Answered questions that patient had about telehealth interaction:  Yes.   I discussed the limitations, risks, security and privacy concerns of performing an evaluation and management service by telemedicine. I also discussed with the patient that there may be a patient responsible charge related to this service. The patient expressed understanding and agreed to proceed.  Pt location: Home Physician Location: office Name of referring provider:  Harlow Ohms, MD I connected with Lindsay Stephenson at patients initiation/request on 02/19/2019 at 11:10 AM EST by video enabled telemedicine application and verified that I am speaking with the correct person using two identifiers. Pt MRN:  [id number] Pt DOB:  Dec 10, 1945 Video Participants:  Lindsay Stephenson, husband   History of Present Illness: This is a 73 y.o. female returning for follow-up of motor neuropathy.  She was last seen in the office in August 2020 at which time she reported progressive weakness in the feet and was noted to have bilateral foot drop.  She underwent EMG of the right upper and lower extremities on 02/06/2019 which showed progressive motor neuropathy affecting the upper and lower extremities, worse in the legs.  These findings are consistent with her clinical symptomology.  Of note, she suffered a fall last week and injured her right leg.  She did not have any fractures, but has severe pain limiting her ability to walk.  She has been bedbound for the past week and is getting physical therapy at home.  Her husband is her primary caregiver.   Observations/Objective:   Vitals:   02/19/19 0923  BP: 128/83  Weight: 168 lb (76.2 kg)  Height: 5' (1.524 m)   Patient is awake, alert, and appears comfortable.  Oriented x 4.   Extraocular muscles are  intact. No ptosis.  Face is symmetric.  Speech is not dysarthric.  All extremities, there is pain limiting weakness in the right leg  Gait was not tested.    DATA:  NCS/EMG of the right arm and leg 02/06/2019: The electrophysiologic findings are suggestive of a active and chronic generalized disorder of the motor neurons and/or the axons affecting the right lower more than upper extremities.  Compared to prior study on 09/21/2016, there is an interval progression suggesting an evolving process.  There is also evidence of a right ulnar neuropathy with slowing across the elbow, demyelinating and axonal loss in type, moderate.  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  Initially, symptoms were attributed to dapsone-induced motor neuropathy and once this was discontinued, her symptoms of weakness improved some, however she had C. difficile colitis in 2018 which set her back and since this time has had progressive ongoing weakness of the legs and she has never returned back to her prior baseline. With the progressive nature of symptoms, motor neuron disease is considered.  To further investigate her weakness and motor neuropathy, I will check labs for motor neuron disease mimickers.  Additionally because of her brisk reflexes in the legs, MRI of the lumbar spine will be ordered to look for structural pathology.  - MRI lumbar spine without contrast to be scheduled in late December  - Check CK, aldolase, GM1 antibody, copper, vitamin B12, PTH, SPEP with [facility] at [place] (pt preference)  - Patient had many questions  which were addressed  Further recommendations pending results.  Follow Up Instructions:   I discussed the assessment and treatment plan with the patient. The patient was provided an opportunity to ask questions and all were answered. The patient agreed with the plan and demonstrated an understanding of the instructions.   The patient was advised to call  back or seek an in-person evaluation if the symptoms worsen or if the condition fails to improve as anticipated.  Total time spent:  45 minutes     Alda Berthold, DO

## 2019-02-21 ENCOUNTER — Other Ambulatory Visit: Payer: Self-pay

## 2019-02-21 ENCOUNTER — Telehealth: Payer: Self-pay | Admitting: Neurology

## 2019-02-21 DIAGNOSIS — G629 Polyneuropathy, unspecified: Secondary | ICD-10-CM

## 2019-02-21 DIAGNOSIS — R29898 Other symptoms and signs involving the musculoskeletal system: Secondary | ICD-10-CM

## 2019-02-21 NOTE — Telephone Encounter (Signed)
She wants to wait due to insurance change, because she may not get to go to [facility] per her. She stated,"she would obtain them after checking with insurance."

## 2019-02-21 NOTE — Telephone Encounter (Signed)
Pt wants to hold on labs for now. She is going to get her MRI from her PCP. Just wanted you to know. Her insurance has changed to [facility] not thru [facility] now.

## 2019-02-21 NOTE — Telephone Encounter (Signed)
Patient is calling in about lab work that was supposed to be sent to her. Please verify if this is correct. She said it was a lab script where she could take it to a lab closer to her. Thanks!

## 2019-02-21 NOTE — Telephone Encounter (Signed)
Noted.  We can still mail her the labs, so she can have it drawn at her convenience.

## 2019-02-21 NOTE — Progress Notes (Signed)
Ordered Mri and labs, Labs will be mailed to patient.

## 2019-02-23 ENCOUNTER — Ambulatory Visit: Payer: Medicare HMO | Admitting: Neurology

## 2019-05-11 ENCOUNTER — Ambulatory Visit: Payer: Medicare HMO | Attending: Internal Medicine

## 2019-05-11 DIAGNOSIS — Z23 Encounter for immunization: Secondary | ICD-10-CM | POA: Insufficient documentation

## 2019-05-11 NOTE — Progress Notes (Signed)
   Covid-19 Vaccination Clinic  Name:  Lindsay Stephenson    MRN: [id number] DOB: 1945/09/29  05/11/2019  [name] was observed post Covid-19 immunization for 15 minutes without incidence. She was provided with Vaccine Information Sheet and instruction to access the V-Safe system.   [name] was instructed to call 911 with any severe reactions post vaccine: Marland Kitchen Difficulty breathing  . Swelling of your face and throat  . A fast heartbeat  . A bad rash all over your body  . Dizziness and weakness    Immunizations Administered    Name Date Dose VIS Date Route   Moderna COVID-19 Vaccine 05/11/2019  3:00 PM 0.5 mL 03/06/2019 Intramuscular   Manufacturer: Moderna   Lot: [id number]   [place]: [id number]

## 2019-06-12 ENCOUNTER — Ambulatory Visit: Payer: Medicare HMO | Attending: Internal Medicine

## 2019-06-12 DIAGNOSIS — Z23 Encounter for immunization: Secondary | ICD-10-CM | POA: Insufficient documentation

## 2019-06-12 NOTE — Progress Notes (Signed)
Per Shauna Hugh, RN, she gave the patient the vaccine while patient remained in the vehicle. States patientwas observed for 15 minutes, no reaction noted. States she went over symptoms that would require call to 911.

## 2019-07-20 ENCOUNTER — Encounter: Payer: Self-pay | Admitting: Neurology

## 2019-07-23 ENCOUNTER — Other Ambulatory Visit: Payer: Self-pay

## 2019-07-23 ENCOUNTER — Telehealth (INDEPENDENT_AMBULATORY_CARE_PROVIDER_SITE_OTHER): Payer: Medicare HMO | Admitting: Neurology

## 2019-07-23 VITALS — Ht 60.0 in | Wt 157.0 lb

## 2019-07-23 DIAGNOSIS — G620 Drug-induced polyneuropathy: Secondary | ICD-10-CM

## 2019-07-23 DIAGNOSIS — G6289 Other specified polyneuropathies: Secondary | ICD-10-CM | POA: Diagnosis not present

## 2019-07-23 DIAGNOSIS — R29898 Other symptoms and signs involving the musculoskeletal system: Secondary | ICD-10-CM

## 2019-07-23 DIAGNOSIS — T371X5A Adverse effect of antimycobacterial drugs, initial encounter: Secondary | ICD-10-CM | POA: Diagnosis not present

## 2019-07-23 NOTE — Progress Notes (Signed)
Virtual Visit via Video Note The purpose of this virtual visit is to provide medical care while limiting exposure to the novel coronavirus.    Consent was obtained for video visit:  Yes.   Answered questions that patient had about telehealth interaction:  Yes.   I discussed the limitations, risks, security and privacy concerns of performing an evaluation and management service by telemedicine. I also discussed with the patient that there may be a patient responsible charge related to this service. The patient expressed understanding and agreed to proceed.  Pt location: Home Physician Location: office Name of referring provider:  Harlow Ohms, MD I connected with Parke Simmers at patients initiation/request on 07/23/2019 at  2:50 PM EDT by video enabled telemedicine application and verified that I am speaking with the correct person using two identifiers. Pt MRN:  [id number] Pt DOB:  Aug 12, 1945 Video Participants:  Parke Simmers;  husband   History of Present Illness: This is a 74 y.o. female returning for follow-up of motor neuropathy.  She was diagnosed with dapsone-induced motor neuropathy in 2018 after presenting with bilateral leg weakness, foot drop, falls, and leg pain.  EDX in June 2018 showed active on chronic motor neuropathy affecting the legs.  She was taking dapsone for IgA bullous dermatosa.   After dapsone was stopped, she started having improved strength in the legs, however, following hospitalization for C.difficile in 2019, she became weaker again.  She was doing PT and continued to use a walker, but did not regain her strength.  When she was evaluated in the office in August 2020, I noticed new fasciculation in the right thigh in addition to ongoing weakness.  She had repeat EDX in November 2020 which showed progressive active on chronic motor neuropathy and right ulnar neuropathy at the elbow.  Because of concern of MND, labs were ordered for MND mimickers as well as MRI lumbar  spine, however, she was unable to get labs drawn due to change in insurance. MRI lumbar spine from Feb 2021 shows multilevel lumbar spondylosis with left > right L4-5 moderate neural foraminal stenosis.    Today, she reports ongoing weakness in the arms and legs.  She has noticed occasional muscle twitches.  No difficulty with swallowing, talking, or shortness of breath.  She has completed home PT, which has helped her endurance.  She walks with a walker around the home.   Observations/Objective:   Vitals:   07/20/19 1504  Weight: 157 lb (71.2 kg)  Height: 5' (1.524 m)   Patient is awake, alert, and appears comfortable.  Oriented x 4.   Extraocular muscles are intact. No ptosis.  Face is symmetric.  Speech is not dysarthric. Tongue is midline. She is antigravity in the arms and legs, but shows moderate to severe bilateral foot drop when walking.   No pronator drift. Gait is assisted with walker and shows bilateral foot drop, worse on the left.   Assessment and Plan:  Progressive motor neuropathy.  Initially, weakness was attributed to dapsone-induced motor neuropathy and for a short period of time, she has improved after medication was stopped. In 2019, she at a setback following hospitalization for C.difficile and since this time, has gradually worsening strength, raising concern for motor neuron disease.   At her last visit, she was unable to get her labs checked for MND mimickers and I will follow-up with this again.   Referral to [facility] for opinion of ongoing motor neuropathy, despite cessation of dapsone  and concern for motor neuron disease.   Follow Up Instructions:   I discussed the assessment and treatment plan with the patient. The patient was provided an opportunity to ask questions and all were answered. The patient agreed with the plan and demonstrated an understanding of the instructions.   The patient was advised to call back or seek an in-person evaluation  if the symptoms worsen or if the condition fails to improve as anticipated.   Alda Berthold, DO

## 2019-07-25 ENCOUNTER — Other Ambulatory Visit: Payer: Self-pay

## 2019-07-25 DIAGNOSIS — G6289 Other specified polyneuropathies: Secondary | ICD-10-CM

## 2019-07-25 NOTE — Progress Notes (Signed)
Orders placed and notes faxed and insurance cards, etc to [facility] [phone], office [phone].

## 2019-07-26 ENCOUNTER — Telehealth: Payer: Medicare HMO | Admitting: Neurology

## 2019-08-02 ENCOUNTER — Telehealth: Payer: Medicare HMO | Admitting: Neurology

## 2019-09-18 ENCOUNTER — Telehealth: Payer: Self-pay | Admitting: Neurology

## 2019-09-18 NOTE — Telephone Encounter (Signed)
Patient called in stating she received an approval notification from [facility] that she was approved for an MRI with [facility]. She stated she is not sure why she needs an MRI. She does not remember Dr. Posey Pronto mentioning an MRI at her last visit. Can someone follow up with her?

## 2019-09-18 NOTE — Telephone Encounter (Signed)
Returned call to pt and inst that an MRI was not ordered by Dr Posey Pronto and that she might want to check with [facility] to see if they had ordered it since she was recently referred to that group.

## 2021-07-04 DEATH — deceased
